# Patient Record
Sex: Female | Born: 1955
Health system: Southern US, Community
[De-identification: ages and names within clinical notes are randomized; demographics above are authoritative.]

## PROBLEM LIST (undated history)

## (undated) DIAGNOSIS — M1990 Unspecified osteoarthritis, unspecified site: Secondary | ICD-10-CM

## (undated) DIAGNOSIS — K55039 Acute (reversible) ischemia of large intestine, extent unspecified: Secondary | ICD-10-CM

## (undated) DIAGNOSIS — D239 Other benign neoplasm of skin, unspecified: Secondary | ICD-10-CM

## (undated) DIAGNOSIS — Z86010 Personal history of colonic polyps: Secondary | ICD-10-CM

## (undated) DIAGNOSIS — Z9889 Other specified postprocedural states: Secondary | ICD-10-CM

## (undated) DIAGNOSIS — E785 Hyperlipidemia, unspecified: Secondary | ICD-10-CM

## (undated) DIAGNOSIS — E669 Obesity, unspecified: Secondary | ICD-10-CM

## (undated) DIAGNOSIS — E041 Nontoxic single thyroid nodule: Secondary | ICD-10-CM

## (undated) DIAGNOSIS — D3A090 Benign carcinoid tumor of the bronchus and lung: Secondary | ICD-10-CM

## (undated) DIAGNOSIS — E039 Hypothyroidism, unspecified: Secondary | ICD-10-CM

## (undated) DIAGNOSIS — I10 Essential (primary) hypertension: Secondary | ICD-10-CM

## (undated) DIAGNOSIS — H269 Unspecified cataract: Secondary | ICD-10-CM

## (undated) DIAGNOSIS — M792 Neuralgia and neuritis, unspecified: Secondary | ICD-10-CM

## (undated) DIAGNOSIS — B351 Tinea unguium: Secondary | ICD-10-CM

## (undated) DIAGNOSIS — K5792 Diverticulitis of intestine, part unspecified, without perforation or abscess without bleeding: Secondary | ICD-10-CM

## (undated) DIAGNOSIS — L290 Pruritus ani: Secondary | ICD-10-CM

## (undated) DIAGNOSIS — L84 Corns and callosities: Secondary | ICD-10-CM

## (undated) DIAGNOSIS — Z460 Encounter for fitting and adjustment of spectacles and contact lenses: Secondary | ICD-10-CM

## (undated) DIAGNOSIS — R51 Headache: Secondary | ICD-10-CM

## (undated) DIAGNOSIS — N3941 Urge incontinence: Secondary | ICD-10-CM

## (undated) DIAGNOSIS — R112 Nausea with vomiting, unspecified: Secondary | ICD-10-CM

## (undated) DIAGNOSIS — M940 Chondrocostal junction syndrome [Tietze]: Secondary | ICD-10-CM

## (undated) DIAGNOSIS — K219 Gastro-esophageal reflux disease without esophagitis: Secondary | ICD-10-CM

## (undated) DIAGNOSIS — C801 Malignant (primary) neoplasm, unspecified: Secondary | ICD-10-CM

## (undated) DIAGNOSIS — K644 Residual hemorrhoidal skin tags: Secondary | ICD-10-CM

## (undated) DIAGNOSIS — A048 Other specified bacterial intestinal infections: Secondary | ICD-10-CM

## (undated) DIAGNOSIS — R7301 Impaired fasting glucose: Secondary | ICD-10-CM

## (undated) DIAGNOSIS — I872 Venous insufficiency (chronic) (peripheral): Secondary | ICD-10-CM

## (undated) HISTORY — DX: Diverticulitis of intestine, part unspecified, without perforation or abscess without bleeding: K57.92

## (undated) HISTORY — PX: LEFT OOPHORECTOMY: SHX1961

## (undated) HISTORY — DX: Essential (primary) hypertension: I10

## (undated) HISTORY — DX: Personal history of colonic polyps: Z86.010

## (undated) HISTORY — PX: ABDOMINAL HYSTERECTOMY: SUR658

## (undated) HISTORY — PX: THYROID SURGERY: SHX805

## (undated) HISTORY — PX: TONSILLECTOMY: SUR1361

## (undated) HISTORY — PX: COLONOSCOPY: SHX174

## (undated) HISTORY — PX: HEMORRHOID BANDING: SHX5850

## (undated) HISTORY — DX: Malignant (primary) neoplasm, unspecified: C80.1

## (undated) HISTORY — DX: Other specified bacterial intestinal infections: A04.8

## (undated) HISTORY — PX: PLANTAR FASCIA SURGERY: SHX746

## (undated) HISTORY — DX: Tinea unguium: B35.1

## (undated) HISTORY — DX: Obesity, unspecified: E66.9

## (undated) HISTORY — DX: Hyperlipidemia, unspecified: E78.5

## (undated) HISTORY — DX: Benign carcinoid tumor of the bronchus and lung: D3A.090

## (undated) HISTORY — DX: Unspecified cataract: H26.9

## (undated) HISTORY — DX: Residual hemorrhoidal skin tags: K64.4

## (undated) HISTORY — DX: Chondrocostal junction syndrome (tietze): M94.0

## (undated) HISTORY — PX: OTHER SURGICAL HISTORY: SHX169

## (undated) HISTORY — PX: APPENDECTOMY: SHX54

## (undated) HISTORY — DX: Gastro-esophageal reflux disease without esophagitis: K21.9

## (undated) HISTORY — DX: Impaired fasting glucose: R73.01

## (undated) HISTORY — PX: ESOPHAGOGASTRODUODENOSCOPY: SHX1529

## (undated) HISTORY — DX: Other benign neoplasm of skin, unspecified: D23.9

## (undated) HISTORY — DX: Headache: R51

---
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
## 1999-03-20 ENCOUNTER — Encounter (INDEPENDENT_AMBULATORY_CARE_PROVIDER_SITE_OTHER): Payer: Self-pay | Admitting: *Deleted

## 1999-03-20 ENCOUNTER — Ambulatory Visit (HOSPITAL_BASED_OUTPATIENT_CLINIC_OR_DEPARTMENT_OTHER): Admission: RE | Admit: 1999-03-20 | Discharge: 1999-03-20 | Payer: Self-pay

## 2000-08-25 ENCOUNTER — Encounter (INDEPENDENT_AMBULATORY_CARE_PROVIDER_SITE_OTHER): Payer: Self-pay | Admitting: *Deleted

## 2000-08-25 ENCOUNTER — Ambulatory Visit (HOSPITAL_BASED_OUTPATIENT_CLINIC_OR_DEPARTMENT_OTHER): Admission: RE | Admit: 2000-08-25 | Discharge: 2000-08-25 | Payer: Self-pay | Admitting: Plastic Surgery

## 2001-04-14 ENCOUNTER — Ambulatory Visit (HOSPITAL_COMMUNITY): Admission: RE | Admit: 2001-04-14 | Discharge: 2001-04-14 | Payer: Self-pay | Admitting: Obstetrics and Gynecology

## 2001-04-14 ENCOUNTER — Encounter: Payer: Self-pay | Admitting: Obstetrics and Gynecology

## 2002-04-26 ENCOUNTER — Encounter: Payer: Self-pay | Admitting: Obstetrics and Gynecology

## 2002-04-26 ENCOUNTER — Encounter: Admission: RE | Admit: 2002-04-26 | Discharge: 2002-04-26 | Payer: Self-pay | Admitting: Obstetrics and Gynecology

## 2002-05-18 ENCOUNTER — Encounter: Payer: Self-pay | Admitting: Family Medicine

## 2002-05-18 ENCOUNTER — Ambulatory Visit (HOSPITAL_COMMUNITY): Admission: RE | Admit: 2002-05-18 | Discharge: 2002-05-18 | Payer: Self-pay | Admitting: Family Medicine

## 2002-07-23 ENCOUNTER — Encounter: Payer: Self-pay | Admitting: Obstetrics and Gynecology

## 2002-07-24 ENCOUNTER — Encounter (INDEPENDENT_AMBULATORY_CARE_PROVIDER_SITE_OTHER): Payer: Self-pay

## 2002-07-24 ENCOUNTER — Inpatient Hospital Stay (HOSPITAL_COMMUNITY): Admission: RE | Admit: 2002-07-24 | Discharge: 2002-07-26 | Payer: Self-pay | Admitting: Obstetrics and Gynecology

## 2002-09-26 ENCOUNTER — Ambulatory Visit (HOSPITAL_COMMUNITY): Admission: RE | Admit: 2002-09-26 | Discharge: 2002-09-26 | Payer: Self-pay | Admitting: Neurology

## 2003-05-09 ENCOUNTER — Encounter: Admission: RE | Admit: 2003-05-09 | Discharge: 2003-05-09 | Payer: Self-pay | Admitting: Obstetrics and Gynecology

## 2003-10-02 ENCOUNTER — Ambulatory Visit (HOSPITAL_COMMUNITY): Admission: RE | Admit: 2003-10-02 | Discharge: 2003-10-02 | Payer: Self-pay | Admitting: Otolaryngology

## 2004-05-14 ENCOUNTER — Encounter: Admission: RE | Admit: 2004-05-14 | Discharge: 2004-05-14 | Payer: Self-pay | Admitting: Obstetrics and Gynecology

## 2005-01-11 DIAGNOSIS — K644 Residual hemorrhoidal skin tags: Secondary | ICD-10-CM

## 2005-01-11 HISTORY — DX: Residual hemorrhoidal skin tags: K64.4

## 2005-05-17 ENCOUNTER — Encounter: Admission: RE | Admit: 2005-05-17 | Discharge: 2005-05-17 | Payer: Self-pay | Admitting: Family Medicine

## 2005-10-01 ENCOUNTER — Ambulatory Visit: Payer: Self-pay | Admitting: Internal Medicine

## 2005-10-11 ENCOUNTER — Ambulatory Visit (HOSPITAL_COMMUNITY): Admission: RE | Admit: 2005-10-11 | Discharge: 2005-10-11 | Payer: Self-pay | Admitting: Internal Medicine

## 2005-10-11 ENCOUNTER — Encounter (INDEPENDENT_AMBULATORY_CARE_PROVIDER_SITE_OTHER): Payer: Self-pay | Admitting: Specialist

## 2005-10-11 ENCOUNTER — Ambulatory Visit: Payer: Self-pay | Admitting: Internal Medicine

## 2005-10-11 ENCOUNTER — Encounter: Payer: Self-pay | Admitting: Internal Medicine

## 2005-10-29 ENCOUNTER — Ambulatory Visit: Payer: Self-pay | Admitting: Internal Medicine

## 2006-03-14 ENCOUNTER — Ambulatory Visit (HOSPITAL_COMMUNITY): Admission: RE | Admit: 2006-03-14 | Discharge: 2006-03-14 | Payer: Self-pay | Admitting: Family Medicine

## 2006-05-10 ENCOUNTER — Ambulatory Visit: Payer: Self-pay | Admitting: Thoracic Surgery

## 2006-05-19 ENCOUNTER — Encounter: Admission: RE | Admit: 2006-05-19 | Discharge: 2006-05-19 | Payer: Self-pay | Admitting: Obstetrics and Gynecology

## 2007-05-22 ENCOUNTER — Encounter: Admission: RE | Admit: 2007-05-22 | Discharge: 2007-05-22 | Payer: Self-pay | Admitting: Family Medicine

## 2007-05-25 ENCOUNTER — Encounter: Admission: RE | Admit: 2007-05-25 | Discharge: 2007-05-25 | Payer: Self-pay | Admitting: Family Medicine

## 2007-12-03 ENCOUNTER — Encounter: Admission: RE | Admit: 2007-12-03 | Discharge: 2007-12-03 | Payer: Self-pay | Admitting: Orthopedic Surgery

## 2008-01-23 ENCOUNTER — Ambulatory Visit: Payer: Self-pay | Admitting: Family Medicine

## 2008-05-22 ENCOUNTER — Encounter: Admission: RE | Admit: 2008-05-22 | Discharge: 2008-05-22 | Payer: Self-pay | Admitting: Family Medicine

## 2008-07-01 ENCOUNTER — Encounter: Payer: Self-pay | Admitting: Internal Medicine

## 2008-07-29 ENCOUNTER — Encounter: Payer: Self-pay | Admitting: Internal Medicine

## 2008-10-09 ENCOUNTER — Encounter: Payer: Self-pay | Admitting: Internal Medicine

## 2008-10-17 ENCOUNTER — Encounter: Payer: Self-pay | Admitting: Internal Medicine

## 2008-10-23 ENCOUNTER — Ambulatory Visit: Payer: Self-pay | Admitting: Internal Medicine

## 2008-10-23 DIAGNOSIS — K644 Residual hemorrhoidal skin tags: Secondary | ICD-10-CM | POA: Insufficient documentation

## 2008-10-23 DIAGNOSIS — R1013 Epigastric pain: Secondary | ICD-10-CM | POA: Insufficient documentation

## 2008-11-18 ENCOUNTER — Ambulatory Visit: Payer: Self-pay | Admitting: Internal Medicine

## 2008-11-18 ENCOUNTER — Encounter: Payer: Self-pay | Admitting: Internal Medicine

## 2008-11-20 ENCOUNTER — Encounter: Payer: Self-pay | Admitting: Internal Medicine

## 2008-11-20 ENCOUNTER — Telehealth: Payer: Self-pay | Admitting: Internal Medicine

## 2009-06-24 ENCOUNTER — Encounter: Admission: RE | Admit: 2009-06-24 | Discharge: 2009-06-24 | Payer: Self-pay | Admitting: Family Medicine

## 2010-01-11 DIAGNOSIS — K5792 Diverticulitis of intestine, part unspecified, without perforation or abscess without bleeding: Secondary | ICD-10-CM

## 2010-01-11 HISTORY — DX: Diverticulitis of intestine, part unspecified, without perforation or abscess without bleeding: K57.92

## 2010-01-31 ENCOUNTER — Encounter: Payer: Self-pay | Admitting: Family Medicine

## 2010-02-01 ENCOUNTER — Encounter: Payer: Self-pay | Admitting: Family Medicine

## 2010-02-01 ENCOUNTER — Encounter: Payer: Self-pay | Admitting: Otolaryngology

## 2010-05-22 ENCOUNTER — Other Ambulatory Visit: Payer: Self-pay | Admitting: Family Medicine

## 2010-05-22 DIAGNOSIS — Z1231 Encounter for screening mammogram for malignant neoplasm of breast: Secondary | ICD-10-CM

## 2010-05-29 NOTE — Op Note (Signed)
   NAME:  Debbie Miranda, Debbie Miranda                          ACCOUNT NO.:  [id number]   MEDICAL RECORD NO.:  [id number]                   PATIENT TYPE:  OUT   LOCATION:  MDC                                  FACILITY:  [facility]   PHYSICIAN:  Marlan Palau, M.D.               DATE OF BIRTH:  1955-07-01   DATE OF PROCEDURE:  09/26/2002  DATE OF DISCHARGE:                                 OPERATIVE REPORT   PROCEDURE:  Lumbar puncture.   PHYSICIAN:  Marlan Palau, M.D.   INDICATIONS FOR PROCEDURE:  This patient is a 55 year old with a history of  episodic pain and numbness of the extremities and body.  The patient is  being evaluated for demyelinating disease or other inflammatory causes of  changes.   DESCRIPTION OF PROCEDURE:  The lumbar puncture was performed with the  patient in the fetal position on the left side.  The low back was cleaned  with Betadine solution, and approximately 2 mL of 1% Xylocaine was used as  local anesthetic.  The #20 gauge spinal needle was inserted into the L3-4  level, and approximately 18 mL of clear colorless spinal fluid was removed  for testing.  Tube #1 was sent for VDRL, cryptococcal antigen and antigen-diverting enzyme  level.  Tube #2 was sent for oligoclonal banding, IgG to albumin ratio.  Tube #3 was sent for cells, differential, glucose and protein.  Tube #4 was  sent for Lyme's antibody panel.  The blood work was all sent for compliment  level, C3, C4, CH50, protein-S, protein-C anti-inflammatory levels and a  antiphospholipid antibody panel, cryoglobulin antibody and factor V Leiden  studies.  The patient tolerated the procedure well.  No complications with the above  procedure were noted.                                                Marlan Palau, M.D.    CKW/MEDQ  D:  09/26/2002  T:  09/26/2002  Job:  [id number]

## 2010-05-29 NOTE — H&P (Signed)
NAME:  Debbie Miranda, Debbie Miranda                          ACCOUNT NO.:  [id number]   MEDICAL RECORD NO.:  [id number]                   PATIENT TYPE:  INP   LOCATION:  NA                                   FACILITY:  [facility]   PHYSICIAN:  Katherine Roan, M.D.               DATE OF BIRTH:  23-Jul-1955   DATE OF ADMISSION:  DATE OF DISCHARGE:                                HISTORY & PHYSICAL   CHIEF COMPLAINT:  Abdominal pain.   HISTORY OF PRESENT ILLNESS:  Debbie Miranda is a 55 year old gravida 1 female  who delivered in 1984, who presented to the office last week with acute  abdominal pain.  Found to have a large 7 cm posterior mass confirmed on  ultrasound and CT.  Is admitted now for laparotomy, possible hysterectomy.   PAST MEDICAL HISTORY:  She has a history of a tonsillectomy and a rectal  fistula.   ALLERGIES:  She is said to be allergic to CODEINE.   MEDICATIONS:  On no medications at this point.  She has taken Vicodin for  pain.   REVIEW OF SYSTEMS:  HEENT:  She wears contacts, but has noted no decrease in  vision or auditory acuity.  No history of dizziness.  HEART:  No history of  hypertension, no rheumatic fever, no mitral valve prolapse.  LUNGS:  No  chronic cough, no asthma, no hay fever.  GENITOURINARY:  No stress  incontinence or frequency of urinary.  GASTROINTESTINAL:  No bowel habit  change, no melena, no diarrhea.  MUSCLES, BONES, AND JOINTS:  Negative.   FAMILY HISTORY:  Mother is 68 and living with hypertension.  Father died at  age 31 of renal cell carcinoma.  She has one sister who had a hysterectomy  because of a fibroid.  One brother living and well.  Her mother and brother  were hypertensive.  She has grandparents with CVA's.  Her maternal  grandmother was diabetic.   PHYSICAL EXAMINATION:  GENERAL:  A well-developed nourished female who is in  some distress with dull abdominal pain.  VITAL SIGNS:  Blood pressure is 140/80, weight 184.  She is well-developed,  nourished, appears to be her stated age.  She is oriented to time, place,  and recent event.  HEENT:  Unremarkable.  Oropharynx is not injected.  NECK:  Supple.  Carotid pulses are equal without bruits.  BREASTS:  No masses or tenderness.  LUNGS:  Clear to P and A.  HEART:  Normal sinus rhythm, no murmur.  ABDOMEN:  Tender without guarding, no rebound.  EXTREMITIES:  Good range of motion, equal pulses and reflexes.  PELVIC:  The cervix is clean.  The uterus is anterior.  There is a large 6  cm to 8 cm left adnexal mass which is posterior to the uterus.  It is quite  tender.  NEUROLOGIC:  Grossly intact.    IMPRESSION:  Large  pelvic mass.   PLAN:  Laparotomy and necessary procedures.                                               Katherine Roan, M.D.    SDM/MEDQ  D:  07/23/2002  T:  07/23/2002  Job:  [id number]

## 2010-05-29 NOTE — Discharge Summary (Signed)
   NAME:  Debbie Miranda, Debbie Miranda                          ACCOUNT NO.:  [id number]   MEDICAL RECORD NO.:  [id number]                   PATIENT TYPE:  INP   LOCATION:  0444                                 FACILITY:  [facility]   PHYSICIAN:  Katherine Roan, M.D.               DATE OF BIRTH:  1956/01/07   DATE OF ADMISSION:  07/24/2002  DATE OF DISCHARGE:  07/26/2002                                 DISCHARGE SUMMARY   ADMISSION DIAGNOSIS:  Pelvic pain and pelvic cyst.   DISCHARGE DIAGNOSES:  1. Uterine fibroids.  2. Inflammatory cyst of pelvis.  3. Endometriosis of left ovary.   INDICATION FOR ADMISSION:  Ms. Dancer is a 55 year old female that had  delivered in 1984, presented in the office with abdominal pain, found to  have a 7 cm pelvic mass.  She was admitted for laparoscopy and possible  hysterectomy.  She was on no medications other than pain pills.   LABORATORY STUDIES:  Hemoglobin on admission was 13.  PT and PTT were  normal.  Routine chemistry was likewise normal.  She was A positive.   HOSPITAL COURSE:  The patient was admitted to the hospital, underwent a  pelvic exam under anesthesia, exploratory laparotomy, excision of pelvic  cyst, total hysterectomy with removal of left ovary and left ovarian cyst.  Culdoplasty was performed and an incidental appendectomy was performed.   The patient was admitted to the hospital and underwent the above-mentioned  surgical procedure with excision of a large pelvic cyst.  The left ovary had  numerous dark, endometriotic implants in the hilum, although pathologically  this was not confirmed.  The cyst in the pelvis was an inflammatory cyst,  was actually between the uterus and the bladder.  The appendix was normal.  The patient's postoperative course was uncomplicated.  She remained afebrile  and without complaint, and she was discharged on July 26, 2002 to home and  office care.  She was given a prescription for Percocet for pain.  She was  asked to call for fever, bleeding, or any other difficulty.   CONDITION ON DISCHARGE:  Improved.                                               Katherine Roan, M.D.    SDM/MEDQ  D:  08/09/2002  T:  08/09/2002  Job:  [id number]

## 2010-05-29 NOTE — H&P (Signed)
NAME:  Debbie Miranda, Debbie Miranda                ACCOUNT NO.:  [id number]   MEDICAL RECORD NO.:  [id number]         PATIENT TYPE:  AMB   LOCATION:  DAY                           FACILITY:  [facility]   PHYSICIAN:  Lionel December, M.D.    DATE OF BIRTH:  [birth year]   DATE OF ADMISSION:  DATE OF DISCHARGE:  LH                                HISTORY & PHYSICAL   CHIEF COMPLAINT:  Change in bowel movements with more of a constipation.   HISTORY OF PRESENT ILLNESS:  Debbie Miranda is a 55 year old lady who presents for  further evaluation of change in bowel habits.  She used to be very regular  at least once daily.  For the past 2 months, she has been having difficulty  moving her bowels.  She is going up to a week at a time without a bowel  movement.  She has been trying Colace and enemas with little results.  She  took Perdiem twice a day for 3 days and finally had some results on one  occasion.  She saw Sherie Don, Family Nurse Practitioner with Dr.  Gerda Diss and was started on MiraLax.  She has been on it for a few days and  finally had a bowel movement this morning.  She has had intermittent bright  red blood per rectum, especially if she strains.  She feels it is from her  hemorrhoids.  She notes her stools have a decreased caliber.  Denies any  melena.  She has changed her diet over the last couple months trying to lose  weight.  She has lost 30 pounds.  She is consuming high fiber diet.  She is  drinking 80 ounces of water daily and walking 30 minutes daily.  The only  change in her medication was the addition of flaxseed oil.  Denies any  problems with heartburn, nausea or vomiting.  Her last colonoscopy was in  January 2000 done for chronic intermittent rectal bleeding.  She had  internal and external hemorrhoids but otherwise normal.   CURRENT MEDICATIONS:  1. Simvastatin 20 mg daily.  2. Multivitamin daily.  3. Aspirin 81 mg daily.  4. Calcium 500 mg two daily.  5. Garlic 1000 mg daily.  6. Omega-3  1200 mg two times daily.  7. Flaxseed oil 1000 mg daily.  8. MiraLax 17 grams daily.   ALLERGIES:  NO KNOWN DRUG ALLERGIES.   PAST MEDICAL HISTORY:  Hypercholesterolemia; status post hysterectomy,  tonsillectomy and appendectomy.   FAMILY HISTORY:  Mother has hypercholesterolemia and hypertension.  Father  deceased at age 64 of kidney cancer.  No family history of colorectal  cancer.   SOCIAL HISTORY:  She is married with one child.  She is a housewife.  She  has never been a smoker.  No alcohol use.   REVIEW OF SYSTEMS:  GI AND CONSTITUTIONAL:  See HPI.  CARDIOPULMONARY:  No  chest pain or shortness of breath.   PHYSICAL EXAMINATION:  VITAL SIGNS:  Weight 251, height 5 feet 8 inches.  Temperature 97.8, blood pressure 130/70, pulse 72.  GENERAL:  Pleasant obese Caucasian  female in no acute distress.  SKIN:  Warm and dry.  No jaundice.  HEENT:  Conjunctivae are pink.  Sclerae nonicteric.  Oropharyngeal mucosa  moist and pink.  No lesions, erythema or exudate.  No lymphadenopathy or  thyromegaly.  CHEST:  Lungs are clear to auscultation.  Cardiac exam reveals regular rate  and rhythm, normal S1/S2, no murmurs, rubs, or gallops.  ABDOMEN:  Positive bowel sounds, obese but symmetrical, soft.  Nontender.  No organomegaly or masses.  No rebound tenderness or guarding.  No abdominal  bruits or hernias.  EXTREMITIES:  No edema.   LABORATORIES:  Labs from September 27, 2005 CBC normal, MET-7 normal, LFTs  normal.  Amylase normal.   IMPRESSION AND PLAN:  Adraine is a 55 year old lady with a change in her bowel  movements.  She has had a significant change to constipation with decreased  stool caliber.  She is having intermittent bright red blood per rectum with  known history of hemorrhoids.  She has lost 30 pounds which has been  intentional.  Recent labs were normal although TSH was not checked although  I doubt her constipation is related to hypothyroidism given that she has  been  able to lose weight with dieting.  Her last colonoscopy was over 7  years ago.  Given this acute change in her bowel habits, I would recommend  diagnostic colonoscopy at this time.  I discussed risks, alternatives and  benefits with regards to the risk of reaction to medication, infection,  bleeding and perforation.  The patient is agreeable to proceed.  Also, she  will continue MiraLax 17 grams daily.      Tana Coast, P.A.      Lionel December, M.D.  Electronically Signed    LL/MEDQ  D:  10/01/2005  T:  10/04/2005  Job:  [id number]   cc:   Lorin Picket A. Gerda Diss, MD  Fax: [phone]   Sherie Don, FNP

## 2010-05-29 NOTE — Op Note (Signed)
NAME:  Debbie Miranda, Debbie Miranda                ACCOUNT NO.:  [id number]   MEDICAL RECORD NO.:  [id number]          PATIENT TYPE:  AMB   LOCATION:  DAY                           FACILITY:  [facility]   PHYSICIAN:  Lionel December, M.D.    DATE OF BIRTH:  [birth year]   DATE OF PROCEDURE:  10/11/2005  DATE OF DISCHARGE:                                 OPERATIVE REPORT   PROCEDURE:  Colonoscopy.   INDICATION:  [name] is a 55 year old Caucasian female with sudden change in  her bowel habits about 2 months ago.  She has gone from having regular bowel  movements like clockwork to being constipated with poor results with diet  and stool softeners.  Her last colonoscopy was in January 2000.  Family  history is negative colorectal carcinoma.  Procedure risks were reviewed the  patient, informed consent was obtained.  Please note that she recently had a  TSH which was normal.   MEDS FOR CONSCIOUS SEDATION:  Fentanyl 50 mcg IV Versed 6 mg IV.   FINDINGS:  Procedure performed in endoscopy suite.  The patient's vital  signs and O2 sat were monitored during procedure and remained stable.  The  patient was placed left lateral position and rectal examination performed.  No abnormality noted external or digital exam.  Olympus videoscope was  placed rectum and advanced under vision into sigmoid colon beyond.  Preparation was excellent.  Scope was easily passed into cecum which was  identified by appendiceal orifice and ileocecal valve.  Pictures taken for  the record.  Short segment of T I was also examined was normal.  As the  scope was withdrawn colonic mucosa was carefully examined.  There was small  flat polyp at proximal sigmoid colon, possibly hyperplastic lesion.  This  was ablated via cold biopsy for histology.  Polypectomy was complete.  Mucosa rest of the sigmoid colon and rectum was normal.  Scope was  retroflexed to examine anorectal junction and small hemorrhoids noted below  the dentate line along with two  anal papilla.  These are rather small.  Pictures taken for the record.  Endoscope was straightened and withdrawn.  The patient tolerated the procedure well.   FINAL DIAGNOSIS:  No evidence of colonic stricture or neoplasm.  Small  sessile polyp ablated via cold biopsy from proximal sigmoid colon.   External hemorrhoids and two small anal papillae.   Suspect her constipation is either due dietary changes or idiopathic  (colonic dysmotility).   RECOMMENDATIONS:  She will continue high-fiber diet and fiber supplement 3-4  grams b.i.d. and MiraLax as before.   I will be contacting the patient with biopsy results and further  recommendations.    We will see her in office in 2-3 weeks.      Lionel December, M.D.  Electronically Signed     NR/MEDQ  D:  10/11/2005  T:  10/12/2005  Job:  [id number]   cc:   Lorin Picket A. Gerda Diss, MD  Fax: [phone]

## 2010-05-29 NOTE — Op Note (Signed)
NAME:  Debbie Miranda, Debbie Miranda                          ACCOUNT NO.:  [id number]   MEDICAL RECORD NO.:  [id number]                   PATIENT TYPE:  INP   LOCATION:  Z660                                 FACILITY:  [facility]   PHYSICIAN:  Katherine Roan, M.D.               DATE OF BIRTH:  1955-02-11   DATE OF PROCEDURE:  07/24/2002  DATE OF DISCHARGE:                                 OPERATIVE REPORT   SURGERY PERFORMED:  1. Pelvic exam under anesthesia.  2. Exploratory laparotomy.  3. Excision of pelvic cysts.  4. Excision of posterior pelvic nodule.  5. Total abdominal hysterectomy.  6. Left salpingo-oophorectomy.  7. Removal of right ovarian cyst and culdoplasty.   SURGEON:  Katherine Roan, M.D.   DESCRIPTION OF PROCEDURE:  The patient was placed in lithotomy position,  frogleg.  Before prep, a pelvic exam under anesthesia confirmed the pelvic  mass that was seen on ultrasound.  Foley catheter was inserted, and the  patient was prepped in the usual fashion.  Transverse skin incision was made  in the abdomen.  Pelvic washings were obtained after we entered the pelvic  peritoneum.  There was a cystic structure between the uterus and the  anterior bladder that measured about 6 cm.  It was anterior to the uterus,  and we thought it was posterior.  It contained blood, and I removed it using  sharp dissection.  It appeared grossly benign.  The left ovary had some  endometriosis on its surface.  The right ovary had a fleshy cystic mass in  the ovary.  The upper abdomen was explored.  The liver, gallbladder, two  kidneys, and the periaortic areas were all free from any suspicious  findings.  The abdominal viscera were then packed away from the pelvic  viscera.  The pelvic cyst was excised.  Small nodule posteriorly on the  right uterosacral ligament was excised.  This was consistent with small  seedling fibroid.  The right ovarian cyst was excised using 3-0 Vicryl for  hemostasis.  The left  ovary, because of the endometriosis in the surface,  was removed.  Left infundibulopelvic ligament was skeletonized and ligated.  The right uteroovarian anastomosis and tube were clamped and ligated as  well.  Round ligaments were suture ligated.  Bladder flap created.  Uterine  vessels were isolated and clamped and ligated.  Bladder was pushed off the  lower segment carefully.  Cardinals and uterosacral ligaments were then  clamped and ligated with 0 chromic suture.  The angles of the vagina were  entered; specimen was removed from the operative field.  This consisted of  the uterus and left tube and ovary.  The vagina was then closed in the  vertical plane using horizontal mattress sutures of 0 chromic and 0 Vicryl.  Culdoplasty was then performed using 0 Vicryl sutures.  Vault support was  obtained.  Retroperitoneal area  was closed.  Hemostasis was secure.  The  sponge and needle count was correct.  The parietal peritoneum was closed  with 2-0 PDS.  The fascia was closed with 2-0 PDS.  Subcutaneum was closed  with interrupted sutures of 2-0 PDS, and the skin was closed with 3-0 plain  subcuticular type suture.  Hemostasis was secure.  Prior to opening the  abdomen, the lower abdomen was infiltrated with 0.5% Marcaine with  epinephrine.  Ms. Lacek tolerated the procedure well.                                               Katherine Roan, M.D.    SDM/MEDQ  D:  07/24/2002  T:  07/24/2002  Job:  [id number]

## 2010-05-29 NOTE — Op Note (Signed)
[facility]  Patient:    Debbie Miranda, Debbie Miranda                         MRN: [id number] Proc. Date: 03/20/99 Attending:  Zigmund Daniel, M.D.                           Operative Report  PREOPERATIVE DIAGNOSIS:  Anterior anal fissure with associated skin tag.  POSTOPERATIVE DIAGNOSIS:  Anterior anal fissure with associated skin tag.  OPERATION:  Excision of skin tag and repair of anal fissure.  SURGEON:  Zigmund Daniel, M.D.  ANESTHESIA:  General.  DESCRIPTION OF PROCEDURE:  After the patient was adequately anesthetized in the  lithotomy position, I carefully prepped the area and examined the anal canal. There was a very large anterior fissure.  There was some slightly enlarged hemorrhoids, but not a significant clinical problem.  I thoroughly anesthetized the anal region with long-acting local anesthetic.  There was a large skin tag just  distal to the fissure which I excised with cautery.  I cauterized the fissure itself with the Bovie to reduce subsequent bleeding.  The distal rectal mucosa as normal in appearance.  I put in a retractor to slightly stretch the anal canal, and made an incision on the right lateral aspect of the anal canal over the palpable hypertrophic internal sphincter, and I cut the hypertrophic portion with the scissors.  Hemostasis was good.  I closed the incision with a single chromic stitch.  I applied a small bandage.  The patient tolerated the operation well. DD:  03/20/99 TD:  03/22/99 Job: [id number]

## 2010-06-30 ENCOUNTER — Ambulatory Visit
Admission: RE | Admit: 2010-06-30 | Discharge: 2010-06-30 | Disposition: A | Discharge status: Home / Self Care | Payer: 59 | Source: Ambulatory Visit | Attending: Family Medicine | Admitting: Family Medicine

## 2010-06-30 DIAGNOSIS — Z1231 Encounter for screening mammogram for malignant neoplasm of breast: Secondary | ICD-10-CM

## 2010-08-10 ENCOUNTER — Ambulatory Visit (HOSPITAL_COMMUNITY)
Admission: RE | Admit: 2010-08-10 | Discharge: 2010-08-10 | Disposition: A | Discharge status: Home / Self Care | Payer: 59 | Source: Ambulatory Visit | Attending: Family Medicine | Admitting: Family Medicine

## 2010-08-10 ENCOUNTER — Other Ambulatory Visit: Payer: Self-pay | Admitting: Family Medicine

## 2010-08-10 DIAGNOSIS — R112 Nausea with vomiting, unspecified: Secondary | ICD-10-CM | POA: Insufficient documentation

## 2010-08-10 DIAGNOSIS — Q619 Cystic kidney disease, unspecified: Secondary | ICD-10-CM | POA: Insufficient documentation

## 2010-08-10 DIAGNOSIS — R1032 Left lower quadrant pain: Secondary | ICD-10-CM | POA: Insufficient documentation

## 2010-08-10 DIAGNOSIS — K5732 Diverticulitis of large intestine without perforation or abscess without bleeding: Secondary | ICD-10-CM | POA: Insufficient documentation

## 2010-08-10 MED ORDER — IOHEXOL 300 MG/ML  SOLN
100.0000 mL | Freq: Once | INTRAMUSCULAR | Status: AC | PRN
  Administered 2010-08-10: 100 mL via INTRAVENOUS

## 2010-11-03 ENCOUNTER — Other Ambulatory Visit: Payer: Self-pay | Admitting: Family Medicine

## 2010-11-03 ENCOUNTER — Ambulatory Visit (HOSPITAL_COMMUNITY)
Admission: RE | Admit: 2010-11-03 | Discharge: 2010-11-03 | Disposition: A | Discharge status: Home / Self Care | Payer: 59 | Source: Ambulatory Visit | Attending: Family Medicine | Admitting: Family Medicine

## 2010-11-03 DIAGNOSIS — R05 Cough: Secondary | ICD-10-CM

## 2010-11-03 DIAGNOSIS — R059 Cough, unspecified: Secondary | ICD-10-CM

## 2010-11-03 DIAGNOSIS — E041 Nontoxic single thyroid nodule: Secondary | ICD-10-CM

## 2010-11-04 ENCOUNTER — Ambulatory Visit (HOSPITAL_COMMUNITY)
Admission: RE | Admit: 2010-11-04 | Discharge: 2010-11-04 | Disposition: A | Discharge status: Home / Self Care | Payer: 59 | Source: Ambulatory Visit | Attending: Family Medicine | Admitting: Family Medicine

## 2010-11-04 DIAGNOSIS — E049 Nontoxic goiter, unspecified: Secondary | ICD-10-CM | POA: Insufficient documentation

## 2010-11-04 DIAGNOSIS — E041 Nontoxic single thyroid nodule: Secondary | ICD-10-CM

## 2010-12-10 DIAGNOSIS — B351 Tinea unguium: Secondary | ICD-10-CM

## 2010-12-10 HISTORY — DX: Tinea unguium: B35.1

## 2010-12-31 ENCOUNTER — Ambulatory Visit (HOSPITAL_COMMUNITY)
Admission: RE | Admit: 2010-12-31 | Discharge: 2010-12-31 | Disposition: A | Discharge status: Home / Self Care | Payer: 59 | Source: Ambulatory Visit | Attending: Family Medicine | Admitting: Family Medicine

## 2010-12-31 ENCOUNTER — Other Ambulatory Visit: Payer: Self-pay | Admitting: Family Medicine

## 2010-12-31 DIAGNOSIS — R059 Cough, unspecified: Secondary | ICD-10-CM

## 2010-12-31 DIAGNOSIS — R05 Cough: Secondary | ICD-10-CM | POA: Insufficient documentation

## 2011-01-09 ENCOUNTER — Other Ambulatory Visit: Payer: Self-pay | Admitting: Internal Medicine

## 2011-01-11 NOTE — Telephone Encounter (Signed)
Refilled for # 30 only, Patient needs an appointment no more refills until seen.

## 2011-01-12 DIAGNOSIS — D3A090 Benign carcinoid tumor of the bronchus and lung: Secondary | ICD-10-CM

## 2011-01-12 HISTORY — DX: Benign carcinoid tumor of the bronchus and lung: D3A.090

## 2011-03-15 ENCOUNTER — Other Ambulatory Visit: Payer: Self-pay | Admitting: Dermatology

## 2011-04-25 ENCOUNTER — Other Ambulatory Visit: Payer: Self-pay | Admitting: Internal Medicine

## 2011-04-30 ENCOUNTER — Encounter: Payer: Self-pay | Admitting: *Deleted

## 2011-05-03 ENCOUNTER — Ambulatory Visit (INDEPENDENT_AMBULATORY_CARE_PROVIDER_SITE_OTHER): Payer: 59 | Admitting: Internal Medicine

## 2011-05-03 ENCOUNTER — Encounter: Payer: Self-pay | Admitting: Internal Medicine

## 2011-05-03 VITALS — BP 126/86 | HR 64 | Ht 68.0 in | Wt 258.6 lb

## 2011-05-03 DIAGNOSIS — K219 Gastro-esophageal reflux disease without esophagitis: Secondary | ICD-10-CM

## 2011-05-03 DIAGNOSIS — K629 Disease of anus and rectum, unspecified: Secondary | ICD-10-CM

## 2011-05-03 DIAGNOSIS — K5792 Diverticulitis of intestine, part unspecified, without perforation or abscess without bleeding: Secondary | ICD-10-CM

## 2011-05-03 DIAGNOSIS — K6289 Other specified diseases of anus and rectum: Secondary | ICD-10-CM

## 2011-05-03 DIAGNOSIS — L290 Pruritus ani: Secondary | ICD-10-CM

## 2011-05-03 DIAGNOSIS — K5732 Diverticulitis of large intestine without perforation or abscess without bleeding: Secondary | ICD-10-CM

## 2011-05-03 MED ORDER — METRONIDAZOLE 500 MG PO TABS: 500.0000 mg | ORAL_TABLET | Freq: Three times a day (TID) | ORAL | Status: AC

## 2011-05-03 MED ORDER — CIPROFLOXACIN HCL 500 MG PO TABS: 500.0000 mg | ORAL_TABLET | Freq: Two times a day (BID) | ORAL | Status: AC

## 2011-05-03 MED ORDER — PANTOPRAZOLE SODIUM 40 MG PO TBEC: 40.0000 mg | DELAYED_RELEASE_TABLET | Freq: Every day | ORAL | Status: DC

## 2011-05-03 NOTE — Patient Instructions (Signed)
You have been scheduled for an appointment with _____ at [facility]. Your appointment is on ____ at ___. Please arrive at ____ for registration. Make certain to bring a list of current medications, including any over the counter medications or vitamins. Also bring your co-pay if you have one as well as your insurance cards. [facility] is located at [address]. Should you need to reschedule your appointment, please contact them at [phone].  We have sent medications to your pharmacy for you to pick up at your convenience.

## 2011-05-03 NOTE — Progress Notes (Signed)
Subjective:    Patient ID: Debbie Miranda, female    DOB: April 09, 1955, 56 y.o.   MRN: [id number]  HPI This is a pleasant 56 year old white woman seen in the past for erosive reflux esophagitis. She was started on pantoprazole 40 mg daily and did well. She took that for at least several months and then decided that she did not want to take a medicine chronically. For about 56 year or so she did well. Perhaps longer. In the past several weeks she's had problems with epigastric pain, nausea and burning retrosternal pain it times. It has awakened her at night. She does not use caffeine and she is not a smoker. She went back on pantoprazole for the past 56 weeks it is notable improvement but not a lot yet. There is no dysphagia.  Another problem she is here to discuss is that she was diagnosed with diverticulitis by CT scan, left sided colon diverticulitis in July of 2012. She responded to antibiotics. She had another episode and responded to antibiotics again. In the past week she's developed recurrent pain and similar clinical scenario, she had a prescription at home, of Cipro and metronidazole and started that and as noted prompt improvement. She has several more days of antibiotics left. Previously had a colonoscopy in 2007 by Dr. Karilyn Cota, showing diverticulosis.  Another problem she has his anal itching has been severe intermittent for 56 years. She says she feels a bump in the perianal area and there is a focal point of itching. She has tried steroid cream as, over-the-counter agents and is found the only thing that seems to help is to soak in a hot, and she'll get relief for 30 minutes or so. She is trying to avoid over aggressive wiping, she notes no significant change in her bowel habits. She is using wet wipes when she can. There is no discharge from the anal area or this pump. No bleeding reported.  Medications, allergies, past medical history, past surgical history, family history and social history  are reviewed and updated in the EMR.    Review of Systems As above    Objective:   Physical Exam WDWN NAD - obese abd mildly tender in epigastrium Rectal : exam with female staff present - anoderm with tags at 5 oclock and 9 oclock and there is a pinpoint red lesion at  5oclock - skin is open there, no fluid or pus or blood expessed - this is where she itches, no rectal mass, brown stool (scanty)       Assessment & Plan:   1. GERD (gastroesophageal reflux disease)   I think her upper abdominal symptoms are related to this, she is to stay on PPI. If she fails to improve over the next couple of months she is to call me back, otherwise am asked to return later in the year so we can discuss dose modification, she may not need chronic daily therapy but possibly well.   2. Diverticulitis   Finish Cipro and metronidazole. I have given her another prescription to keep at home as she has no tach, she is to contact me if she needs to use those antibiotics.   3. Anal itching   4. Perianal lesion   She has this perianal lesion of unclear significance. It  does not look worrisome but she indicates that this is the problem lesion in that she has focal itching at this area. I do not think it's from over aggressive hygiene she has no other  perianal irritation. She has failed multiple topical steroids and over-the-counter agents including pramoxine I believe. While it looks like the opening of a fistula, I don't think that's the case. I do not detect any type of abscess. I am referring her to general surgery for further evaluation and possible treatment. Perhaps there is some sort of focal topical therapy that the surgeon may be able applied relieve her problems. I made no promises, however.     Cc: Lilyan Punt, MD and Claud Kelp, MD

## 2011-05-25 ENCOUNTER — Other Ambulatory Visit: Payer: Self-pay | Admitting: Family Medicine

## 2011-05-25 DIAGNOSIS — Z1231 Encounter for screening mammogram for malignant neoplasm of breast: Secondary | ICD-10-CM

## 2011-05-26 ENCOUNTER — Encounter (INDEPENDENT_AMBULATORY_CARE_PROVIDER_SITE_OTHER): Payer: Self-pay | Admitting: General Surgery

## 2011-05-26 ENCOUNTER — Ambulatory Visit (INDEPENDENT_AMBULATORY_CARE_PROVIDER_SITE_OTHER): Payer: 59 | Admitting: General Surgery

## 2011-05-26 VITALS — BP 138/86 | HR 60 | Temp 96.4°F | Resp 18 | Ht 68.0 in | Wt 261.4 lb

## 2011-05-26 DIAGNOSIS — L290 Pruritus ani: Secondary | ICD-10-CM

## 2011-05-26 NOTE — Patient Instructions (Signed)
The cause of your itching is a skin problem called pruritus ani.   This is reversible. You also have some tiny hemorrhoids but I doubt that they are causing the itching.  We have discussed the treatment plan for pruritus ani and you have been given written instructions.  Return to see me if your symptoms do not improve after one month.

## 2011-05-26 NOTE — Progress Notes (Signed)
Patient ID: Debbie Miranda, female   DOB: Feb 11, 1955, 56 y.o.   MRN: [id number]  Chief Complaint  Patient presents with  . Rectal Problems    anal mass    HPI Debbie Miranda is a 56 y.o. female. She is referred by Dr. Stan Head  because of rectal itching. Her primary care physician is Dr. Lilyan Punt.  The patient gives a two-year history of moderately intense anal itching. She rubs or scratches it a lot in the shower and at night. She also notices some small bumps on the skin. She notices excessive moisture of the perianal skin. Her bowel movements are normal. She has rare minimal pain with bowel movements. She also rarely has any bleeding. She has tried multiple steroid creams. She feels the symptoms are coming from the left side.  Past history is significant for a repair anal fissure by Dr. Lebron Conners in 2000. She recovered uneventfully from that. She has a history of esophagitis and a history of outpatient treatment for acute diverticulitis. She has not had any abdominal symptoms right now.  HPI  Past Medical History  Diagnosis Date  . Esophageal reflux   . Hyperlipidemia   . Hypertension   . Headache   . Obesity   . Costochondritis   . External hemorrhoids without mention of complication   . Diverticulitis     Past Surgical History  Procedure Date  . Esophagogastroduodenoscopy   . Appendectomy   . Abdominal hysterectomy   . Tonsillectomy   . Left oophorectomy   . Thyroid surgery     nodule and partial thyroidectomy    Family History  Problem Relation Age of Onset  . Kidney cancer Father   . Breast cancer Maternal Grandmother   . Colon cancer Neg Hx   . Heart disease Maternal Grandfather     Social History History  Substance Use Topics  . Smoking status: Never Smoker   . Smokeless tobacco: Never Used  . Alcohol Use: Yes     wine    Allergies  Allergen Reactions  . Codeine     Current Outpatient Prescriptions  Medication Sig Dispense Refill  .  aspirin 81 MG tablet Take 81 mg by mouth daily.      . cholecalciferol (VITAMIN D) 1000 UNITS tablet Take 1,000 Units by mouth daily.      . Flaxseed, Linseed, 1000 MG CAPS Take 1 capsule by mouth daily.      . Garlic 1000 MG CAPS Take 1 capsule by mouth daily.      Marland Kitchen levothyroxine (SYNTHROID, LEVOTHROID) 88 MCG tablet Take 88 mcg by mouth daily.      . Multiple Vitamins-Minerals (CENTRUM SILVER) tablet Take 1 tablet by mouth daily.      . Omega-3 Fatty Acids (FISH OIL) 1200 MG CAPS Take 1 capsule by mouth 2 (two) times daily.      . pantoprazole (PROTONIX) 40 MG tablet Take 1 tablet (40 mg total) by mouth daily. 30 minutes before breakfast  90 tablet  3  . Probiotic Product (PROBIOTIC FORMULA PO) Take 1 capsule by mouth daily.      . simvastatin (ZOCOR) 20 MG tablet Take 20 mg by mouth daily.        Review of Systems Review of Systems  Constitutional: Negative for fever, chills and unexpected weight change.  HENT: Negative for hearing loss, congestion, sore throat, trouble swallowing and voice change.   Eyes: Negative for visual disturbance.  Respiratory: Negative for cough, shortness  of breath and wheezing.   Cardiovascular: Negative for chest pain, palpitations and leg swelling.  Gastrointestinal: Negative for nausea, vomiting, abdominal pain, diarrhea, constipation, blood in stool, abdominal distention and anal bleeding.  Genitourinary: Negative for hematuria, vaginal bleeding and difficulty urinating.  Musculoskeletal: Negative for arthralgias.  Skin: Negative for rash and wound.  Neurological: Negative for seizures, syncope and headaches.  Hematological: Negative for adenopathy. Does not bruise/bleed easily.  Psychiatric/Behavioral: Negative for confusion.    Blood pressure 138/86, pulse 60, temperature 96.4 F (35.8 C), resp. rate 18, height 5\' 8"  (1.727 m), weight 261 lb 6.4 oz (118.57 kg).  Physical Exam Physical Exam  Constitutional: She is oriented to person, place, and  time. She appears well-developed and well-nourished. No distress.  HENT:  Head: Normocephalic and atraumatic.  Nose: Nose normal.  Mouth/Throat: No oropharyngeal exudate.  Eyes: Conjunctivae and EOM are normal. Pupils are equal, round, and reactive to light. Left eye exhibits no discharge. No scleral icterus.  Neck: Neck supple. No JVD present. No tracheal deviation present. No thyromegaly present.  Cardiovascular: Normal rate, regular rhythm, normal heart sounds and intact distal pulses.   No murmur heard. Pulmonary/Chest: Effort normal and breath sounds normal. No respiratory distress. She has no wheezes. She has no rales. She exhibits no tenderness.  Abdominal: Soft. Bowel sounds are normal. She exhibits no distension and no mass. There is no tenderness. There is no rebound and no guarding.  Genitourinary:       Perianal skin shows mild pruritus aniThere are 2 small open fissures on the left lateral side. There are tiny noninflamed nontender external hemorrhoids left lateral and posterior midline. No fissure or fistula. Digital rectal exam reveals normal sphincter tone, nontender, no mass. Anoscopy reveals a generous rectal mucosa but no pathologic hemorrhoids.  Musculoskeletal: She exhibits no edema and no tenderness.  Lymphadenopathy:    She has no cervical adenopathy.  Neurological: She is alert and oriented to person, place, and time. She exhibits normal muscle tone. Coordination normal.  Skin: Skin is warm. No rash noted. She is not diaphoretic. No erythema. No pallor.  Psychiatric: She has a normal mood and affect. Her behavior is normal. Judgment and thought content normal.    Data Reviewed Dr. Marvell Fuller notes  Assessment    Pruritus ani with early fissuring left lateral.  Minimal external hemorrhoids, suspect asymptomatic  Obesity  History of esophagitis  History acute diverticulitis.    Plan    I discussed the pathophysiology and anatomical changes of pruritus ani  with the patient. She was given patient information booklet about pruritus ani and hemorrhoids.  We stressed  diet restrictions, perianal hygiene, Moisture control, skin care, and so forth. She understands these issues.  Return to see me if there is not improvement in one month.       Angelia Mould. Derrell Lolling, M.D., [facility] General and Minimally invasive Surgery Breast and Colorectal Surgery Office:   [phone] Pager:   [phone]  05/26/2011, 9:21 AM

## 2011-07-05 ENCOUNTER — Ambulatory Visit
Admission: RE | Admit: 2011-07-05 | Discharge: 2011-07-05 | Disposition: A | Discharge status: Home / Self Care | Payer: 59 | Source: Ambulatory Visit | Attending: Family Medicine | Admitting: Family Medicine

## 2011-07-05 DIAGNOSIS — Z1231 Encounter for screening mammogram for malignant neoplasm of breast: Secondary | ICD-10-CM

## 2011-08-30 ENCOUNTER — Ambulatory Visit: Payer: 59 | Admitting: Internal Medicine

## 2011-09-14 ENCOUNTER — Encounter (INDEPENDENT_AMBULATORY_CARE_PROVIDER_SITE_OTHER): Payer: Self-pay | Admitting: General Surgery

## 2011-09-14 ENCOUNTER — Ambulatory Visit (INDEPENDENT_AMBULATORY_CARE_PROVIDER_SITE_OTHER): Payer: 59 | Admitting: General Surgery

## 2011-09-14 VITALS — BP 138/82 | HR 72 | Temp 98.0°F | Resp 16 | Ht 68.0 in | Wt 273.4 lb

## 2011-09-14 DIAGNOSIS — L290 Pruritus ani: Secondary | ICD-10-CM

## 2011-09-14 DIAGNOSIS — K644 Residual hemorrhoidal skin tags: Secondary | ICD-10-CM

## 2011-09-14 NOTE — Progress Notes (Signed)
Patient ID: Debbie Miranda, female   DOB: Nov 17, 1955, 56 y.o.   MRN: [id number]  History: patient was seen by me on 05/26/2011 with a one-year history of perianal itching and burning. She had severe pruritus and early fissuring but minimal external hemorrhoids. She states that she has religiously followed all of the recommendations but she still has a lot of itching and burning. Symptomatically she is no better.  Exam: Pleasant. Morbidly obese. Rectal exam reveals a perianal skin is much improved. There is no more fissuring. There is no erythema. She has tiny external hemorrhoids. Anoscopy reveals small internal hemorrhoids but no bleeding or inflammatory change. I don't see a recurrent fissure.  Assessment: Pruritus ani, significantly improved by visual exam, but symptomatically still bothered by itching and burning External hemorrhoids, noninflamed  Plan: Analpram-HC 2.5% cream to 3 times a day Refer to Dr. Romie Levee to see if further management would be appropriate.  Angelia Mould. Derrell Lolling, M.D., [facility] General and Minimally invasive Surgery Breast and Colorectal Surgery Office:   [phone] Pager:   [phone]

## 2011-09-14 NOTE — Patient Instructions (Signed)
Your perianal skin has healed a great deal. You had tiny hemorrhoids but there is no infection.  The persistence of itching and burning is not well explained to me.  He will be referred to Dr. Maisie Fus, our new colorectal specialist to see if further management is appropriate.  He had been given a prescription for Analpram-HC 2.5% cream to apply to the anal skin 2 or 3 times a day to control the itching.

## 2011-09-20 ENCOUNTER — Ambulatory Visit (INDEPENDENT_AMBULATORY_CARE_PROVIDER_SITE_OTHER): Payer: 59 | Admitting: General Surgery

## 2011-09-20 ENCOUNTER — Ambulatory Visit (INDEPENDENT_AMBULATORY_CARE_PROVIDER_SITE_OTHER): Payer: 59 | Admitting: Internal Medicine

## 2011-09-20 ENCOUNTER — Encounter: Payer: Self-pay | Admitting: Internal Medicine

## 2011-09-20 VITALS — BP 132/80 | HR 60 | Ht 68.0 in | Wt 274.0 lb

## 2011-09-20 DIAGNOSIS — K5792 Diverticulitis of intestine, part unspecified, without perforation or abscess without bleeding: Secondary | ICD-10-CM

## 2011-09-20 DIAGNOSIS — R1032 Left lower quadrant pain: Secondary | ICD-10-CM

## 2011-09-20 DIAGNOSIS — L290 Pruritus ani: Secondary | ICD-10-CM

## 2011-09-20 DIAGNOSIS — K219 Gastro-esophageal reflux disease without esophagitis: Secondary | ICD-10-CM

## 2011-09-20 DIAGNOSIS — K5732 Diverticulitis of large intestine without perforation or abscess without bleeding: Secondary | ICD-10-CM

## 2011-09-20 NOTE — Patient Instructions (Addendum)
Please stop your Pantoprazole and see if that helps your loose bowels, if not may need to get Dr. Gerda Diss to check your Thyroid with a TSH test.  You may stop your flax seed oil if you decide to and stop your probiotic.  Consult with Dr. Gerda Diss about the need for garlic, and fish oil.  If you develop heartburn or upper abdominal pain call us back about restarting a PPI.  Continue the GERD diet please. A handout is given to you today for a guide.  Follow-up as needed.  Thank you for choosing me and [facility].  Iva Boop, M.D., [facility]

## 2011-09-20 NOTE — Progress Notes (Signed)
  Subjective:    Patient ID: Debbie Miranda, female    DOB: Jan 05, 1956, 56 y.o.   MRN: [id number]  HPI The patient returns for followup of GERD symptoms, history of diverticulitis, and pruritus ani.  She is not having any heartburn or epigastric pain. She has been on pantoprazole for 3 months. She would like to stop it.  The pruritus ani has persisted, she has seen Dr. Derrell Lolling of surgery, he has prescribed pramoxine HC. That provides relief but when she doesn't use it she still has the problem. She has been able to stop over-wiping the anal area but is still symptomatic. She is going to see the new colorectal surgeon.  She is noting some early morning urgent defecation. This is new since starting the pantoprazole. She did have thyroid surgery and was started on Synthroid, initially she thought that she had not had a check of her TSH after the Synthroid dose was increased once but then recall that she did. So she's been on a stable dose for some time. Progress she is on fish oil supplements and flaxseed oil she has been on for about 5 years after her husband had bypass surgery though she has no known coronary artery disease issues. She is on a probiotic supplement that she does not think really helps.  She i also concerned that she might have a hernia in the left abdomen if she has some pain in between the left flank and left umbilical area more in the left lower quadrant mid region.  Medications, allergies, past medical history, past surgical history, family history and social history are reviewed and updated in the EMR.  Review of Systems As above    Objective:   Physical Exam WDWN, obese, NAD Abd: obese, soft, nontender, no hernia in Left quadrant - supine with muscle tension and standing     Assessment & Plan:   1. GERD (gastroesophageal reflux disease)   2. Pruritus ani   3. Diverticulitis   4. Abdominal pain, left lower quadrant    1. Overall she is improved 2. She will stop the  pantoprazole and see how it goes. She had a few erosions on endoscopy in the past. She might need a chronic PPI therapy though it could be related to urgent defecation. We will see how she does, she might not need a high-dose PPI in the future if she has recurrent problems. 3. Await the colorectal surgeons opinion regarding her pruritus ani and skin tags the 4. Diverticulitis is asymptomatic she knows to call back if this recurs, last colonoscopy 2007 (small distal hyperplastic polyp - Dr. Karilyn Cota) so routine colonoscopy not due till 2017 5. Should she have persistent urgent defecation issues I think a recheck of her TSH is reasonable if she can do that through primary care. She can stop a probiotic, and she can discuss whether she really needs fish oil and flaxseed oil with her primary care physician, based upon what I know about her I don't think those are especially indicated or needed.  I appreciate the opportunity to care for this patient.   CC: LUKING,SCOTT, MDand Romie Levee, MD

## 2011-09-21 ENCOUNTER — Ambulatory Visit (INDEPENDENT_AMBULATORY_CARE_PROVIDER_SITE_OTHER): Payer: 59 | Admitting: General Surgery

## 2011-09-21 ENCOUNTER — Encounter (INDEPENDENT_AMBULATORY_CARE_PROVIDER_SITE_OTHER): Payer: Self-pay | Admitting: General Surgery

## 2011-09-21 VITALS — BP 136/88 | HR 70 | Temp 97.9°F | Resp 16 | Ht 68.0 in | Wt 275.5 lb

## 2011-09-21 DIAGNOSIS — L290 Pruritus ani: Secondary | ICD-10-CM

## 2011-09-21 NOTE — Patient Instructions (Addendum)
Pruritus Ani  What is Pruritus Ani (proo-r-tus a-n)? Itching around the anal area is called pruritus ani. This condition results in a compelling urge to scratch. What causes this to happen? Several factors may be at fault. A common cause is excessive moisture in the anal area. Moisture may be due to perspiration or a small amount of residual stool around the anal area. Pruritis ani may be a symptom of other common anal conditions such as hemorrhoids and anal fissures. The initial condition can be made worse by scratching, vigorous cleansing of the area or overuse of topical treatments. In some individuals pruritus ani may be caused by eating certain foods, smoking and drinking alcoholic beverages, especially beer and wine. Food items that have been associated with pruritus ani include: . Coffee, Tea . Carbonated beverages . Milk products . Tomatoes and tomato products such as Ketchup . Cheese . Chocolate . Nuts Does Pruritus Ani result from lack of cleanliness? Cleanliness is almost never a factor. However, the natural tendency once a person develops this itching is to wash the area vigorously and frequently with soap and a washcloth. This almost always makes the problem worse by damaging the skin and washing away protective natural oils. What can be done to make this itching go away? A careful examination by a colon and rectal surgeon or other physician may identify a definite cause for the itching. Your physician can recommend treatment to eliminate the specific problem. Treatment of pruritus ani may include these three points. 1. AVOID MOISTURE in the anal area: . Apply either a few wisps of cotton, a 4 x 4 gauze or some cornstarch powder to keep the area dry. . Avoid all medicated, perfumed and deodorant powders. 2. AVOID FURTHER TRAUMA to the affected area: . Do not use soap of any kind on the anal area. . Do not scrub the anal area with anything - even toilet paper. . For hygiene, it  is best to rinse with warm water and pat the area dry. Use wet toilet paper, baby wipes or a wet washcloth to blot the area clean. Never rub. . Try not to scratch the itchy area. Scratching produces more damage, which in turn makes the itching worse. For individuals that experience irresistible itching at night, wearing socks on the hands may be helpful. 3. USE ONLY MEDICATIONS AS DIRECTED BY YOUR PHYSICIAN. Apply prescription medications sparingly to the skin around the anal area and avoid rubbing. Prolonged use of prescribed or over the counter topical medications may result in irritation or skin dryness that can make the condition worse. How long does this treatment usually take? Most people experience some relief from itching within a week. If symptoms do not resolve after 6 weeks, a follow-up appointment with your colon and rectal surgeon may be needed.               2012 American Society of Colon [employer] Rectal [employer]  

## 2011-09-21 NOTE — Progress Notes (Signed)
CC: anal itching  HISTORY: Debbie Miranda is a 56 y.o. female who presents to the office with puritis ani.  This has occurred for about 2 yrs.  Other symptoms include occasional burning and bleeding.  She has tried diet changes and steroid creams in the past with little permanent success.  In the evening the symptoms are worse.  Taking a bath helps for a few hours.  She uses wet wipes.  It is rather continuous in nature.  Her bowel habits are regular and her bowel movements are soft.  Her fiber intake is good. She is due for another colonoscopy in 2017 per pt.  She does complain of some excess tissue which doesn't reduce.        Past Medical History  Diagnosis Date  . Esophageal reflux   . Hyperlipidemia   .    Marland Kitchen Headache   . Obesity   . Costochondritis   . External hemorrhoids without mention of complication   . Diverticulitis       Past Surgical History  Procedure Date  . Esophagogastroduodenoscopy   . Appendectomy   . Abdominal hysterectomy   . Tonsillectomy   . Left oophorectomy   . Thyroid surgery     nodule and partial thyroidectomy  . Colonoscopy         Current Outpatient Prescriptions  Medication Sig Dispense Refill  . aspirin 81 MG tablet Take 81 mg by mouth daily.      . cholecalciferol (VITAMIN D) 1000 UNITS tablet Take 1,000 Units by mouth daily.      . Garlic 1000 MG CAPS Take 1 capsule by mouth daily.      . hydrocortisone-pramoxine (ANALPRAM-HC) 2.5-1 % rectal cream       . levothyroxine (SYNTHROID, LEVOTHROID) 88 MCG tablet Take 88 mcg by mouth daily.      . Multiple Vitamins-Minerals (CENTRUM SILVER) tablet Take 1 tablet by mouth daily.      . Omega-3 Fatty Acids (FISH OIL) 1200 MG CAPS Take 1 capsule by mouth 2 (two) times daily.      . simvastatin (ZOCOR) 20 MG tablet Take 20 mg by mouth daily.      . Flaxseed, Linseed, (FLAXSEED OIL) 1000 MG CAPS Take 1,000 mg by mouth.      . pantoprazole (PROTONIX) 40 MG tablet Take 40 mg by mouth daily.      .  Probiotic Product (PROBIOTIC FORMULA PO) Take 1 capsule by mouth daily.          Allergies  Allergen Reactions  . Codeine       Family History  Problem Relation Age of Onset  . Kidney cancer Father   . Breast cancer Maternal Grandmother   . Colon cancer Neg Hx   . Heart disease Maternal Grandfather     History   Social History  . Marital Status: Married    Spouse Name: N/A    Number of Children: 1  . Years of Education: N/A   Occupational History  . homemaker    Social History Main Topics  . Smoking status: Never Smoker   . Smokeless tobacco: Never Used  . Alcohol Use: Yes     wine  . Drug Use: No  . Sexually Active: None   Other Topics Concern  . None   Social History Narrative  . None      REVIEW OF SYSTEMS - PERTINENT POSITIVES ONLY: Review of Systems - General ROS: negative for - chills, fever or weight  loss Allergy and Immunology ROS: negative for - no rashes Hematological and Lymphatic ROS: negative for - bleeding problems or blood clots Respiratory ROS: no cough, shortness of breath, or wheezing Cardiovascular ROS: no chest pain or dyspnea on exertion Gastrointestinal ROS: positive for - abdominal pain negative for - change in bowel habits or change in stools Genito-Urinary ROS: no dysuria, trouble voiding, or hematuria  EXAM: Filed Vitals:   09/21/11 0946  BP: 136/88  Pulse: 70  Temp: 97.9 F (36.6 C)  Resp: 16    General appearance: alert and no distress Resp: clear to auscultation bilaterally Cardio: regular rate and rhythm GI: slight tenderness to palpation just left of umbilicus, no hernia palpated  Procedure: Anoscopy Surgeon: Maisie Fus Diagnosis: Puritis Ani  Assistant: Huntley Dec, RN After the risks and benefits were explained, verbal consent was obtained for above procedure  Anesthesia: none Findings: good sphincter tone, R post anal skin tag, small area of inflammation on R post hemorrhoid.  Small internal hemorrhoids, grade 2, R post  is largest  ASSESSMENT AND PLAN: Debbie Miranda is a 56 y.o. female who presents to clinic with puritis ani.  This is a chronic problem.  She has been treated by Dr Derrell Lolling in the past and diet changes have not worked for her.  It appears from her history and exam that her itching is likely from a moisture problem.  I also wonder if the wipes she is using is also causing some irritation, since she states they burn when she wipes.  I have instructed her to use wet 100% cotton to wipe.  She will also use a dry piece of cotton over her anus during the day to keep things dry.  I also toldher that she could use desitin ointment to protect her skin while it was healing.  She will try these things and follow up with me in about a month.  I did discuss other treatment options that we could try if this did not work, including methylene blue injection as a last resort.    Vanita Panda, MD Colon and Rectal Surgery / General Surgery [facility]      Visit Diagnoses: 1. Anal itching     Primary Care Physician: Lilyan Punt, MD

## 2011-10-27 ENCOUNTER — Encounter (INDEPENDENT_AMBULATORY_CARE_PROVIDER_SITE_OTHER): Payer: Self-pay | Admitting: General Surgery

## 2011-10-27 ENCOUNTER — Ambulatory Visit (INDEPENDENT_AMBULATORY_CARE_PROVIDER_SITE_OTHER): Payer: 59 | Admitting: General Surgery

## 2011-10-27 VITALS — BP 110/70 | HR 72 | Temp 99.2°F | Resp 18 | Ht 68.0 in | Wt 268.5 lb

## 2011-10-27 DIAGNOSIS — L290 Pruritus ani: Secondary | ICD-10-CM

## 2011-10-27 HISTORY — DX: Pruritus ani: L29.0

## 2011-10-27 MED ORDER — LIDOCAINE-PRILOCAINE 2.5-2.5 % EX CREA: TOPICAL_CREAM | CUTANEOUS | Status: DC | PRN

## 2011-10-27 NOTE — Progress Notes (Signed)
Debbie Miranda is a 56 y.o. female who is here for a follow up visit regarding her anal itching.  She has tried dietary modifications and keeping the area dry, but she still has itching.    Objective: Filed Vitals:   10/27/11 1403  BP: 110/70  Pulse: 72  Temp: 99.2 F (37.3 C)  Resp: 18    General appearance: alert, cooperative and no distress Abd: soft  Procedure: Anoscopy Surgeon: Maisie Fus Assistant: Christella Scheuermann After the risks and benefits were explained, verbal consent was obtained for above procedure  Anesthesia: none Diagnosis: anal puritis Findings: most itching appears centered around the L lateral skin tag, grade 1 internal hemorrhoids  Assessment and Plan: Debbie Miranda a 56 year old female who is here in the office for followup regarding her anal pruritus. She states that it has not gotten better even there her skin has improved with the methods for keeping her anal canal dry. On exam her skin does look better but she is still having itching. I feel that it is necessary to remove her skin tags as these might be the cause of her itching. The risk and benefits have been explained to the patient.  These include bleeding, infection, anal pain and anal scarring. I believe that she would be a good candidate for surgery. We will proceed with this in early December.    Vanita Panda, MD [facility], [place] [phone]

## 2011-10-27 NOTE — Patient Instructions (Signed)
I have prescribed a cream for you to try for the next few weeks.  We will get your surgery scheduled for early Dec.      Pruritus Ani  What is Pruritus Ani (proo-r-tus a-n)? Itching around the anal area is called pruritus ani. This condition results in a compelling urge to scratch. What causes this to happen? Several factors may be at fault. A common cause is excessive moisture in the anal area. Moisture may be due to perspiration or a small amount of residual stool around the anal area. Pruritis ani may be a symptom of other common anal conditions such as hemorrhoids and anal fissures. The initial condition can be made worse by scratching, vigorous cleansing of the area or overuse of topical treatments. In some individuals pruritus ani may be caused by eating certain foods, smoking and drinking alcoholic beverages, especially beer and wine. Food items that have been associated with pruritus ani include: . Coffee, Tea . Carbonated beverages . Milk products . Tomatoes and tomato products such as Ketchup . Cheese . Chocolate . Nuts Does Pruritus Ani result from lack of cleanliness? Cleanliness is almost never a factor. However, the natural tendency once a person develops this itching is to wash the area vigorously and frequently with soap and a washcloth. This almost always makes the problem worse by damaging the skin and washing away protective natural oils. What can be done to make this itching go away? A careful examination by a colon and rectal surgeon or other physician may identify a definite cause for the itching. Your physician can recommend treatment to eliminate the specific problem. Treatment of pruritus ani may include these three points. 1. AVOID MOISTURE in the anal area: . Apply either a few wisps of cotton, a 4 x 4 gauze or some cornstarch powder to keep the area dry. . Avoid all medicated, perfumed and deodorant powders. 2. AVOID FURTHER TRAUMA to the affected area: Marland Kitchen Do  not use soap of any kind on the anal area. . Do not scrub the anal area with anything - even toilet paper. . For hygiene, it is best to rinse with warm water and pat the area dry. Use wet toilet paper, baby wipes or a wet washcloth to blot the area clean. Never rub. . Try not to scratch the itchy area. Scratching produces more damage, which in turn makes the itching worse. For individuals that experience irresistible itching at night, wearing socks on the hands may be helpful. 3. USE ONLY MEDICATIONS AS DIRECTED BY YOUR PHYSICIAN. Apply prescription medications sparingly to the skin around the anal area and avoid rubbing. Prolonged use of prescribed or over the counter topical medications may result in irritation or skin dryness that can make the condition worse. How long does this treatment usually take? Most people experience some relief from itching within a week. If symptoms do not resolve after 6 weeks, a follow-up appointment with your colon and rectal surgeon may be needed.               2012 [employer] Colon [employer]

## 2011-10-28 ENCOUNTER — Telehealth: Payer: Self-pay | Admitting: Internal Medicine

## 2011-10-28 DIAGNOSIS — L290 Pruritus ani: Secondary | ICD-10-CM

## 2011-10-28 NOTE — Telephone Encounter (Signed)
She could possibly have pinworms. See if lab can help [place] check for these I am not sure how to test for that.

## 2011-10-28 NOTE — Telephone Encounter (Signed)
Patient wants to make sure prior to having her skin tags removed by a surgeon that there is not a possibility of having a parasite.  She wants to make sure that the anal itching is the skin tags and not any other cause.  She denies diarrhea or other complaints.  Dr. Leone Payor please review and advise

## 2011-10-29 NOTE — Telephone Encounter (Signed)
Patient advised.  She says she will try the scotch tape test tonight.  If she sees any worms or eggs in the am, she will come for the collection container.

## 2011-10-29 NOTE — Telephone Encounter (Signed)
I think you need a microscope to see the eggs , ? Worms  May still need to submit it

## 2011-10-29 NOTE — Telephone Encounter (Signed)
According to [facility] and CDC they should be easily visible.  The worms are 1/4 to 1/2 inch and the eggs are visible to the naked eye.

## 2011-10-29 NOTE — Telephone Encounter (Signed)
I spoke with [facility] there is a pinworm exam.  She must collect it using the scotch tape method.  Left message for patient to call back

## 2011-11-16 ENCOUNTER — Encounter (HOSPITAL_BASED_OUTPATIENT_CLINIC_OR_DEPARTMENT_OTHER): Payer: Self-pay | Admitting: *Deleted

## 2011-11-16 NOTE — Progress Notes (Signed)
Pt instructed npo p mn 11/6 x levothroxine w sip of water.  To [facility] 11/7 @ 0745.  Needs hgb, ekg on arrival

## 2011-11-18 ENCOUNTER — Encounter (HOSPITAL_BASED_OUTPATIENT_CLINIC_OR_DEPARTMENT_OTHER): Payer: Self-pay | Admitting: Anesthesiology

## 2011-11-18 ENCOUNTER — Ambulatory Visit (HOSPITAL_BASED_OUTPATIENT_CLINIC_OR_DEPARTMENT_OTHER)
Admission: RE | Admit: 2011-11-18 | Discharge: 2011-11-18 | Disposition: A | Discharge status: Home / Self Care | Payer: 59 | Source: Ambulatory Visit | Attending: General Surgery | Admitting: General Surgery

## 2011-11-18 ENCOUNTER — Encounter (HOSPITAL_BASED_OUTPATIENT_CLINIC_OR_DEPARTMENT_OTHER): Payer: Self-pay | Admitting: *Deleted

## 2011-11-18 ENCOUNTER — Encounter (HOSPITAL_BASED_OUTPATIENT_CLINIC_OR_DEPARTMENT_OTHER)
Admission: RE | Disposition: A | Discharge status: Home / Self Care | Payer: Self-pay | Source: Ambulatory Visit | Attending: General Surgery

## 2011-11-18 ENCOUNTER — Ambulatory Visit (HOSPITAL_BASED_OUTPATIENT_CLINIC_OR_DEPARTMENT_OTHER): Payer: 59 | Admitting: Anesthesiology

## 2011-11-18 DIAGNOSIS — K644 Residual hemorrhoidal skin tags: Secondary | ICD-10-CM | POA: Insufficient documentation

## 2011-11-18 DIAGNOSIS — L919 Hypertrophic disorder of the skin, unspecified: Secondary | ICD-10-CM | POA: Insufficient documentation

## 2011-11-18 DIAGNOSIS — L909 Atrophic disorder of skin, unspecified: Secondary | ICD-10-CM | POA: Insufficient documentation

## 2011-11-18 DIAGNOSIS — K648 Other hemorrhoids: Secondary | ICD-10-CM | POA: Insufficient documentation

## 2011-11-18 DIAGNOSIS — K649 Unspecified hemorrhoids: Secondary | ICD-10-CM

## 2011-11-18 HISTORY — DX: Other specified postprocedural states: R11.2

## 2011-11-18 HISTORY — DX: Unspecified osteoarthritis, unspecified site: M19.90

## 2011-11-18 HISTORY — DX: Other specified postprocedural states: Z98.890

## 2011-11-18 HISTORY — PX: EXCISION OF SKIN TAG: SHX6270

## 2011-11-18 HISTORY — PX: HEMORRHOID SURGERY: SHX153

## 2011-11-18 HISTORY — DX: Hypothyroidism, unspecified: E03.9

## 2011-11-18 LAB — POCT HEMOGLOBIN-HEMACUE: Hemoglobin: 14.3 g/dL (ref 12.0–15.0)

## 2011-11-18 SURGERY — EXCISION, SKIN TAG
Anesthesia: General | Site: Anus

## 2011-11-18 MED ORDER — BUPIVACAINE-EPINEPHRINE 0.5% -1:200000 IJ SOLN
INTRAMUSCULAR | Status: DC | PRN
  Administered 2011-11-18: 20 mL

## 2011-11-18 MED ORDER — NEOSTIGMINE METHYLSULFATE 1 MG/ML IJ SOLN
INTRAMUSCULAR | Status: DC | PRN
  Administered 2011-11-18: 4 mg via INTRAVENOUS

## 2011-11-18 MED ORDER — KETOROLAC TROMETHAMINE 30 MG/ML IJ SOLN
INTRAMUSCULAR | Status: DC | PRN
  Administered 2011-11-18: 30 mg via INTRAVENOUS

## 2011-11-18 MED ORDER — LACTATED RINGERS IV SOLN
INTRAVENOUS | Status: DC | PRN
  Administered 2011-11-18 (×2): via INTRAVENOUS

## 2011-11-18 MED ORDER — ROCURONIUM BROMIDE 100 MG/10ML IV SOLN
INTRAVENOUS | Status: DC | PRN
  Administered 2011-11-18: 20 mg via INTRAVENOUS

## 2011-11-18 MED ORDER — FENTANYL CITRATE 0.05 MG/ML IJ SOLN
25.0000 ug | INTRAMUSCULAR | Status: DC | PRN
  Filled 2011-11-18: qty 1

## 2011-11-18 MED ORDER — DEXAMETHASONE SODIUM PHOSPHATE 4 MG/ML IJ SOLN
INTRAMUSCULAR | Status: DC | PRN
  Administered 2011-11-18: 10 mg via INTRAVENOUS

## 2011-11-18 MED ORDER — PROMETHAZINE HCL 12.5 MG PO TABS: 12.5000 mg | ORAL_TABLET | Freq: Four times a day (QID) | ORAL | Status: DC | PRN

## 2011-11-18 MED ORDER — PROMETHAZINE HCL 25 MG/ML IJ SOLN
6.2500 mg | INTRAMUSCULAR | Status: DC | PRN
  Filled 2011-11-18: qty 1

## 2011-11-18 MED ORDER — SCOPOLAMINE 1 MG/3DAYS TD PT72
1.0000 | MEDICATED_PATCH | TRANSDERMAL | Status: DC
  Administered 2011-11-18: 1.5 mg via TRANSDERMAL

## 2011-11-18 MED ORDER — OXYCODONE HCL 5 MG PO TABS: 5.0000 mg | ORAL_TABLET | ORAL | Status: DC | PRN

## 2011-11-18 MED ORDER — PROMETHAZINE HCL 12.5 MG PO TABS
12.5000 mg | ORAL_TABLET | Freq: Four times a day (QID) | ORAL | Status: AC | PRN
  Administered 2011-11-18: 12.5 mg via ORAL
  Filled 2011-11-18: qty 1

## 2011-11-18 MED ORDER — SCOPOLAMINE 1 MG/3DAYS TD PT72
1.0000 | MEDICATED_PATCH | TRANSDERMAL | Status: DC
  Filled 2011-11-18: qty 1

## 2011-11-18 MED ORDER — NALOXONE HCL 0.4 MG/ML IJ SOLN
INTRAMUSCULAR | Status: DC | PRN
  Administered 2011-11-18: 120 ug via INTRAVENOUS

## 2011-11-18 MED ORDER — DOCUSATE SODIUM 100 MG PO CAPS: 100.0000 mg | ORAL_CAPSULE | Freq: Two times a day (BID) | ORAL | Status: DC

## 2011-11-18 MED ORDER — METOCLOPRAMIDE HCL 5 MG/ML IJ SOLN
INTRAMUSCULAR | Status: DC | PRN
  Administered 2011-11-18: 10 mg via INTRAVENOUS

## 2011-11-18 MED ORDER — ASPIRIN 81 MG PO TABS: 81.0000 mg | ORAL_TABLET | Freq: Every day | ORAL | Status: DC

## 2011-11-18 MED ORDER — LIDOCAINE HCL (CARDIAC) 20 MG/ML IV SOLN
INTRAVENOUS | Status: DC | PRN
  Administered 2011-11-18: 100 mg via INTRAVENOUS

## 2011-11-18 MED ORDER — LACTATED RINGERS IV SOLN
INTRAVENOUS | Status: DC
  Administered 2011-11-18 (×2): via INTRAVENOUS
  Filled 2011-11-18: qty 1000

## 2011-11-18 MED ORDER — SUCCINYLCHOLINE CHLORIDE 20 MG/ML IJ SOLN
INTRAMUSCULAR | Status: DC | PRN
  Administered 2011-11-18: 140 mg via INTRAVENOUS

## 2011-11-18 MED ORDER — GLYCOPYRROLATE 0.2 MG/ML IJ SOLN
INTRAMUSCULAR | Status: DC | PRN
  Administered 2011-11-18: 0.6 mg via INTRAVENOUS

## 2011-11-18 MED ORDER — ONDANSETRON HCL 4 MG/2ML IJ SOLN
INTRAMUSCULAR | Status: DC | PRN
  Administered 2011-11-18: 4 mg via INTRAVENOUS

## 2011-11-18 MED ORDER — MIDAZOLAM HCL 5 MG/5ML IJ SOLN
INTRAMUSCULAR | Status: DC | PRN
  Administered 2011-11-18: 2 mg via INTRAVENOUS

## 2011-11-18 MED ORDER — PSYLLIUM 28 % PO PACK: 1.0000 | PACK | Freq: Two times a day (BID) | ORAL | Status: DC

## 2011-11-18 MED ORDER — KETOROLAC TROMETHAMINE 30 MG/ML IJ SOLN
15.0000 mg | Freq: Once | INTRAMUSCULAR | Status: DC | PRN
  Filled 2011-11-18: qty 1

## 2011-11-18 MED ORDER — DIAZEPAM 5 MG PO TABS: 5.0000 mg | ORAL_TABLET | Freq: Four times a day (QID) | ORAL | Status: DC | PRN

## 2011-11-18 MED ORDER — ACETAMINOPHEN 10 MG/ML IV SOLN
1000.0000 mg | Freq: Four times a day (QID) | INTRAVENOUS | Status: DC
  Administered 2011-11-18: 1000 mg via INTRAVENOUS
  Filled 2011-11-18: qty 100

## 2011-11-18 MED ORDER — FENTANYL CITRATE 0.05 MG/ML IJ SOLN
INTRAMUSCULAR | Status: DC | PRN
  Administered 2011-11-18: 50 ug via INTRAVENOUS
  Administered 2011-11-18: 100 ug via INTRAVENOUS
  Administered 2011-11-18 (×3): 50 ug via INTRAVENOUS

## 2011-11-18 MED ORDER — PROPOFOL 10 MG/ML IV BOLUS
INTRAVENOUS | Status: DC | PRN
  Administered 2011-11-18: 250 mg via INTRAVENOUS

## 2011-11-18 SURGICAL SUPPLY — 42 items
ADH SKN CLS APL DERMABOND .7 (GAUZE/BANDAGES/DRESSINGS)
APL SKNCLS STERI-STRIP NONHPOA (GAUZE/BANDAGES/DRESSINGS)
BENZOIN TINCTURE PRP APPL 2/3 (GAUZE/BANDAGES/DRESSINGS) IMPLANT
BLADE SURG 15 STRL LF DISP TIS (BLADE) ×2 IMPLANT
BLADE SURG 15 STRL SS (BLADE) ×2
BLADE SURG ROTATE 9660 (MISCELLANEOUS) IMPLANT
CHLORAPREP W/TINT 26ML (MISCELLANEOUS) IMPLANT
COVER MAYO STAND STRL (DRAPES) ×3 IMPLANT
COVER TABLE BACK 60X90 (DRAPES) ×3 IMPLANT
DECANTER SPIKE VIAL GLASS SM (MISCELLANEOUS) IMPLANT
DERMABOND ADVANCED (GAUZE/BANDAGES/DRESSINGS)
DERMABOND ADVANCED .7 DNX12 (GAUZE/BANDAGES/DRESSINGS) IMPLANT
DRAPE PED LAPAROTOMY (DRAPES) ×3 IMPLANT
DRSG TEGADERM 4X4.75 (GAUZE/BANDAGES/DRESSINGS) IMPLANT
ELECT REM PT RETURN 9FT ADLT (ELECTROSURGICAL) ×3
ELECTRODE REM PT RTRN 9FT ADLT (ELECTROSURGICAL) ×2 IMPLANT
GLOVE BIO SURGEON STRL SZ 6.5 (GLOVE) ×6 IMPLANT
GLOVE ECLIPSE 6.0 STRL STRAW (GLOVE) ×3 IMPLANT
GLOVE INDICATOR 7.0 STRL GRN (GLOVE) ×3 IMPLANT
GOWN PREVENTION PLUS XLARGE (GOWN DISPOSABLE) IMPLANT
GOWN PREVENTION PLUS XXLARGE (GOWN DISPOSABLE) ×3 IMPLANT
GOWN STRL NON-REIN LRG LVL3 (GOWN DISPOSABLE) ×3 IMPLANT
NEEDLE HYPO 25X1 1.5 SAFETY (NEEDLE) ×3 IMPLANT
NS IRRIG 1000ML POUR BTL (IV SOLUTION) IMPLANT
PENCIL BUTTON HOLSTER BLD 10FT (ELECTRODE) ×3 IMPLANT
SPONGE GAUZE 4X4 12PLY (GAUZE/BANDAGES/DRESSINGS) IMPLANT
STRIP CLOSURE SKIN 1/2X4 (GAUZE/BANDAGES/DRESSINGS) IMPLANT
SUT CHROMIC 2 0 SH (SUTURE) ×3 IMPLANT
SUT CHROMIC 3 0 SH 27 (SUTURE) ×6 IMPLANT
SUT ETHILON 2 0 FS 18 (SUTURE) IMPLANT
SUT ETHILON 4 0 PS 2 18 (SUTURE) IMPLANT
SUT MNCRL AB 4-0 PS2 18 (SUTURE) IMPLANT
SUT SILK 2 0 SH (SUTURE) IMPLANT
SUT VIC AB 2-0 SH 27 (SUTURE)
SUT VIC AB 2-0 SH 27XBRD (SUTURE) IMPLANT
SUT VICRYL 4-0 PS2 18IN ABS (SUTURE) IMPLANT
SYR CONTROL 10ML LL (SYRINGE) ×3 IMPLANT
TOWEL OR 17X24 6PK STRL BLUE (TOWEL DISPOSABLE) ×6 IMPLANT
TUBE ANAEROBIC SPECIMEN COL (MISCELLANEOUS) IMPLANT
UNDERPAD 30X30 INCONTINENT (UNDERPADS AND DIAPERS) IMPLANT
WATER STERILE IRR 1000ML POUR (IV SOLUTION) ×3 IMPLANT
YANKAUER SUCT BULB TIP NO VENT (SUCTIONS) IMPLANT

## 2011-11-18 NOTE — Anesthesia Procedure Notes (Signed)
Procedure Name: Intubation Date/Time: 11/18/2011 8:45 AM Performed by: Jessica Priest Pre-anesthesia Checklist: Patient identified, Emergency Drugs available, Suction available and Patient being monitored Patient Re-evaluated:Patient Re-evaluated prior to inductionOxygen Delivery Method: Circle System Utilized Preoxygenation: Pre-oxygenation with 100% oxygen Intubation Type: IV induction Ventilation: Mask ventilation without difficulty Laryngoscope Size: Mac and 4 Tube type: Oral Tube size: 8.0 mm Number of attempts: 1 Airway Equipment and Method: stylet and oral airway Placement Confirmation: ETT inserted through vocal cords under direct vision,  positive ETCO2 and breath sounds checked- equal and bilateral Tube secured with: Tape Dental Injury: Teeth and Oropharynx as per pre-operative assessment

## 2011-11-18 NOTE — H&P (View-Only) (Signed)
Debbie Miranda is a 55 y.o. female who is here for a follow up visit regarding her anal itching.  She has tried dietary modifications and keeping the area dry, but she still has itching.    Objective: Filed Vitals:   10/27/11 1403  BP: 110/70  Pulse: 72  Temp: 99.2 F (37.3 C)  Resp: 18    General appearance: alert, cooperative and no distress Abd: soft  Procedure: Anoscopy Surgeon: Shacola Schussler Assistant: Glaspey After the risks and benefits were explained, verbal consent was obtained for above procedure  Anesthesia: none Diagnosis: anal puritis Findings: most itching appears centered around the L lateral skin tag, grade 1 internal hemorrhoids  Assessment and Plan: Debbie M Whelessis a 55-year-old female who is here in the office for followup regarding her anal pruritus. She states that it has not gotten better even there her skin has improved with the methods for keeping her anal canal dry. On exam her skin does look better but she is still having itching. I feel that it is necessary to remove her skin tags as these might be the cause of her itching. The risk and benefits have been explained to the patient.  These include bleeding, infection, anal pain and anal scarring. I believe that she would be a good candidate for surgery. We will proceed with this in early December.    .Debbie Miranda [name], MD [facility] [phone]        

## 2011-11-18 NOTE — Transfer of Care (Signed)
Immediate Anesthesia Transfer of Care Note  Patient: Debbie Miranda  Procedure(s) Performed: Procedure(s) (LRB): EXCISION OF SKIN TAG (N/A) HEMORRHOIDECTOMY ()  Patient Location: PACU  Anesthesia Type: General  Level of Consciousness: awake, sedated, patient cooperative and responds to stimulation  Airway & Oxygen Therapy: Patient Spontanous Breathing and Patient connected to face mask oxygen  Post-op Assessment: Report given to PACU RN, Post -op Vital signs reviewed and stable and Patient moving all extremities  Post vital signs: Reviewed and stable  Complications: No apparent anesthesia complications

## 2011-11-18 NOTE — Interval H&P Note (Signed)
History and Physical Interval Note:  11/18/2011 8:27 AM  Debbie Miranda  has presented today for surgery, with the diagnosis of anal skin tag  The various methods of treatment have been discussed with the patient and family. After consideration of risks, benefits and other options for treatment, the patient has consented to  Procedure(s) (LRB) with comments: EXCISION OF SKIN TAG (N/A) as a surgical intervention .  Specific risks of bleeding, post-operative pain and recurrence of itching.  The patient's history has been reviewed, patient examined, no change in status, stable for surgery.  I have reviewed the patient's chart and labs.  Questions were answered to the patient's satisfaction.    Vanita Panda, MD  Colorectal and General Surgery [facility]

## 2011-11-18 NOTE — Op Note (Addendum)
11/18/2011  8:47 AM  PATIENT:  Debbie Miranda  56 y.o. female  Patient Care Team: Babs Sciara, MD as PCP - General (Family Medicine)  PRE-OPERATIVE DIAGNOSIS:  anal skin tag  POST-OPERATIVE DIAGNOSIS:  anal skin tag, left lateral hemorrhoid  PROCEDURE:  Procedure(s): EXCISION OF SKIN TAG  SURGEON:  Surgeon(s): Romie Levee, MD  ASSISTANT: none   ANESTHESIA:   local and general  EBL:     SPECIMEN:  Source of Specimen:  L lateral external hemorrhoid Posterior anal skin tag  DISPOSITION OF SPECIMEN:  PATHOLOGY  COUNTS:  YES  PLAN OF CARE: Discharge to home after PACU  PATIENT DISPOSITION:  PACU - hemodynamically stable.  INDICATION: This is a 56 y.o. female who presented to our office with intractable anal itching.  She has tried dietary modifications and skin management without much success.  She has a moderate skin tag that could be causing the itching.  She mostly fills the itching on the left side.  We have decided to remove this.   OR FINDINGS: posterior anal skin tag, small internal hemorrhoids, left external hemorrhoid  DESCRIPTION: The patient was brought to the OR and general anesthesia was induced.  She was turned prone in the jack-knife position.  Her buttocks were gently taped apart and she was prepped and draped in the usual sterile fashion.  A surgical time out was performed indicating the correct patient, position and procedure.  I began with an internal anal examination using the anoscope.  She had no signs of abscess or fistula. She has small internal hemorrhoids in all 3 positions. She had a relatively prominent left external hemorrhoid and a posterior anal skin tag.  Given her itching on the left side, I decided to remove the left exterior hemorrhoid.  A scalpel was used to make the skin incision and the remainder of the hemorrhoid was dissected using Metzenbaum scissors. Hemostasis was achieved with a running 3-0 chromic suture. This was sent to pathology  for further examination. I then turned my attention to the posterior skin tag. An elliptical incision was made around the skin tag. The skin tag was removed. There were was some bleeding at the base which was cauterized. A 2-0 chromic suture in running fashion was used to close this. After this a rectal field block was used using Marcaine with epinephrine. Sterile fluffs and a dressing were applied. The patient was then awakened from anesthesia and sent to the postanesthesia care unit in stable condition. All counts were correct per operating room staff.

## 2011-11-18 NOTE — Anesthesia Postprocedure Evaluation (Signed)
  Anesthesia Post-op Note  Patient: Debbie Miranda  Procedure(s) Performed: Procedure(s) (LRB): EXCISION OF SKIN TAG (N/A) HEMORRHOIDECTOMY ()  Patient Location: PACU  Anesthesia Type: General  Level of Consciousness: awake and alert   Airway and Oxygen Therapy: Patient Spontanous Breathing  Post-op Pain: mild  Post-op Assessment: Post-op Vital signs reviewed, Patient's Cardiovascular Status Stable, Respiratory Function Stable, Patent Airway and No signs of Nausea or vomiting  Post-op Vital Signs: stable  Complications: No apparent anesthesia complications

## 2011-11-18 NOTE — Anesthesia Preprocedure Evaluation (Addendum)
Anesthesia Evaluation  Patient identified by MRN, date of birth, ID band Patient awake    Reviewed: Allergy & Precautions, H&P , NPO status , Patient's Chart, lab work & pertinent test results  History of Anesthesia Complications (+) PONV  Airway Mallampati: III TM Distance: <3 FB Neck ROM: Full    Dental No notable dental hx.    Pulmonary neg pulmonary ROS,  breath sounds clear to auscultation  Pulmonary exam normal       Cardiovascular Rhythm:Regular Rate:Normal     Neuro/Psych negative neurological ROS  negative psych ROS   GI/Hepatic negative GI ROS, Neg liver ROS, GERD-  Medicated,  Endo/Other  Hypothyroidism Morbid obesity  Renal/GU negative Renal ROS  negative genitourinary   Musculoskeletal negative musculoskeletal ROS (+)   Abdominal   Peds negative pediatric ROS (+)  Hematology negative hematology ROS (+)   Anesthesia Other Findings   Reproductive/Obstetrics negative OB ROS                           Anesthesia Physical Anesthesia Plan  ASA: II  Anesthesia Plan: General   Post-op Pain Management:    Induction: Intravenous  Airway Management Planned: Oral ETT  Additional Equipment:   Intra-op Plan:   Post-operative Plan:   Informed Consent: I have reviewed the patients History and Physical, chart, labs and discussed the procedure including the risks, benefits and alternatives for the proposed anesthesia with the patient or authorized representative who has indicated his/her understanding and acceptance.   Dental advisory given  Plan Discussed with: CRNA and Surgeon  Anesthesia Plan Comments:        Anesthesia Quick Evaluation

## 2011-11-22 ENCOUNTER — Encounter (HOSPITAL_BASED_OUTPATIENT_CLINIC_OR_DEPARTMENT_OTHER): Payer: Self-pay | Admitting: General Surgery

## 2011-11-29 ENCOUNTER — Ambulatory Visit (HOSPITAL_COMMUNITY)
Admission: RE | Admit: 2011-11-29 | Discharge: 2011-11-29 | Disposition: A | Discharge status: Home / Self Care | Payer: 59 | Source: Ambulatory Visit | Attending: Family Medicine | Admitting: Family Medicine

## 2011-11-29 DIAGNOSIS — R072 Precordial pain: Secondary | ICD-10-CM

## 2011-11-29 NOTE — Progress Notes (Signed)
*  PRELIMINARY RESULTS* Echocardiogram 2D Echocardiogram has been performed.  Debbie Miranda 11/29/2011, 3:12 PM

## 2011-12-03 ENCOUNTER — Other Ambulatory Visit: Payer: Self-pay | Admitting: Family Medicine

## 2011-12-03 DIAGNOSIS — R222 Localized swelling, mass and lump, trunk: Secondary | ICD-10-CM

## 2011-12-12 HISTORY — PX: LUNG LOBECTOMY: SHX167

## 2011-12-13 ENCOUNTER — Ambulatory Visit (HOSPITAL_COMMUNITY)
Admission: RE | Admit: 2011-12-13 | Discharge: 2011-12-13 | Disposition: A | Discharge status: Home / Self Care | Payer: 59 | Source: Ambulatory Visit | Attending: Family Medicine | Admitting: Family Medicine

## 2011-12-13 DIAGNOSIS — R918 Other nonspecific abnormal finding of lung field: Secondary | ICD-10-CM | POA: Insufficient documentation

## 2011-12-13 DIAGNOSIS — M47814 Spondylosis without myelopathy or radiculopathy, thoracic region: Secondary | ICD-10-CM | POA: Insufficient documentation

## 2011-12-13 DIAGNOSIS — R222 Localized swelling, mass and lump, trunk: Secondary | ICD-10-CM

## 2011-12-14 ENCOUNTER — Ambulatory Visit (INDEPENDENT_AMBULATORY_CARE_PROVIDER_SITE_OTHER): Payer: 59 | Admitting: General Surgery

## 2011-12-14 ENCOUNTER — Encounter (INDEPENDENT_AMBULATORY_CARE_PROVIDER_SITE_OTHER): Payer: Self-pay | Admitting: General Surgery

## 2011-12-14 VITALS — BP 126/78 | HR 72 | Temp 97.8°F | Resp 16 | Ht 68.0 in | Wt 257.4 lb

## 2011-12-14 DIAGNOSIS — L290 Pruritus ani: Secondary | ICD-10-CM

## 2011-12-14 NOTE — Progress Notes (Signed)
Debbie Miranda is a 56 y.o. female who is here for a follow up visit regarding her anal itching.  She is s/p hemorrhoidectomy.  She is doing well.  Her pain is getting better.  She is having minimal itching.    Objective: Filed Vitals:   12/14/11 1144  BP: 126/78  Pulse: 72  Temp: 97.8 F (36.6 C)  Resp: 16    Gen: NAD Anal: posterior region healing, dime sized area of granulation tissue   Assessment and Plan: Will schedule an apt in 4 wks to eval for resolution of symptoms.      Vanita Panda, MD [facility], [place] [phone]

## 2011-12-14 NOTE — Patient Instructions (Signed)
We will schedule an apt in 4 weeks.  You can cancel if you are having no symptoms by then.

## 2011-12-16 ENCOUNTER — Other Ambulatory Visit (HOSPITAL_COMMUNITY): Payer: 59

## 2011-12-16 ENCOUNTER — Ambulatory Visit: Payer: 59 | Admitting: Internal Medicine

## 2011-12-17 ENCOUNTER — Encounter: Payer: Self-pay | Admitting: Internal Medicine

## 2011-12-17 ENCOUNTER — Ambulatory Visit (INDEPENDENT_AMBULATORY_CARE_PROVIDER_SITE_OTHER): Payer: 59 | Admitting: Internal Medicine

## 2011-12-17 VITALS — BP 108/80 | HR 67 | Ht 68.0 in | Wt 254.0 lb

## 2011-12-17 DIAGNOSIS — R002 Palpitations: Secondary | ICD-10-CM

## 2011-12-17 DIAGNOSIS — R079 Chest pain, unspecified: Secondary | ICD-10-CM

## 2011-12-17 DIAGNOSIS — R9431 Abnormal electrocardiogram [ECG] [EKG]: Secondary | ICD-10-CM

## 2011-12-17 DIAGNOSIS — Z136 Encounter for screening for cardiovascular disorders: Secondary | ICD-10-CM

## 2011-12-17 NOTE — Progress Notes (Addendum)
HPI Patient is a 56 yo who ws referred for evaluation of chest discomfort  The patient had been followed in the past by [facility]  She has a history of CP, palpitations, HL and borderline HTN.   She had an exercise myoview in 2006 that was normal.  Cardionet monitor showed SR with PACs and PVCs but no signif arrhythmia.  B Blocker Rx was givn as needed.  This was in 2008  She was recently seen by S Luking .  EKG was done  Echo was also done that showed LVEF was 75 to 80%.    Occasionally feels interruption in HB  Was constant then  Now occasional  No dizziness  Breathing OK  Has occasional R sided parasternal chest discomfort.  One time per year  Working out with trainer since September  Walking some   She is being followed for a lung nodule that has gotten bigger.  She is waiting for word from [facility] re work up recommendations  Lipids in January LDL was 137, HDL was 53.  This was on ZOcor.  Allergies  Allergen Reactions  . Codeine Nausea And Vomiting    Current Outpatient Prescriptions  Medication Sig Dispense Refill  . aspirin 81 MG tablet Take 1 tablet (81 mg total) by mouth daily.  1 tablet  0  . cholecalciferol (VITAMIN D) 1000 UNITS tablet Take 1,000 Units by mouth daily.      . Garlic 1000 MG CAPS Take 1 capsule by mouth daily.      Marland Kitchen levothyroxine (SYNTHROID, LEVOTHROID) 88 MCG tablet Take 88 mcg by mouth daily.      . Misc Natural Products (OSTEO BI-FLEX TRIPLE STRENGTH PO) Take 2 tablets by mouth daily.      . Multiple Vitamins-Minerals (CENTRUM SILVER) tablet Take 1 tablet by mouth daily.      . Omega-3 Fatty Acids (FISH OIL) 1200 MG CAPS Take 1 capsule by mouth 2 (two) times daily.      . pantoprazole (PROTONIX) 40 MG tablet Take 40 mg by mouth as needed.       . Probiotic Product (PROBIOTIC FORMULA PO) Take 1 capsule by mouth daily.      . promethazine (PHENERGAN) 12.5 MG tablet Take 12.5 mg by mouth every 8 (eight) hours as needed.       . simvastatin  (ZOCOR) 20 MG tablet Take 10 mg by mouth daily. Takes 1/2 a 20 mg tablet        Past Medical History  Diagnosis Date  . Esophageal reflux   . Hyperlipidemia   . Headache   . Obesity   . Costochondritis   . External hemorrhoids without mention of complication   . Diverticulitis   . Hypertension     pt denies  . Hypothyroidism   . Arthritis   . PONV (postoperative nausea and vomiting)     Past Surgical History  Procedure Date  . Esophagogastroduodenoscopy   . Appendectomy   . Abdominal hysterectomy   . Tonsillectomy   . Left oophorectomy   . Thyroid surgery     nodule and partial thyroidectomy  . Colonoscopy   . Rectal fissure repair   . Plantar fascia surgery   . Excision of skin tag 11/18/2011    Procedure: EXCISION OF SKIN TAG;  Surgeon: Romie Levee, MD;  Location: [facility];  Service: General;  Laterality: N/A;  posterior  . Hemorrhoid surgery 11/18/2011    Procedure: HEMORRHOIDECTOMY;  Surgeon: Romie Levee,  MD;  Location: [facility];  Service: General;;    Family History  Problem Relation Age of Onset  . Kidney cancer Father   . Breast cancer Maternal Grandmother   . Colon cancer Neg Hx   . Heart disease Maternal Grandfather     History   Social History  . Marital Status: Married    Spouse Name: N/A    Number of Children: 1  . Years of Education: N/A   Occupational History  . homemaker    Social History Main Topics  . Smoking status: Never Smoker   . Smokeless tobacco: Never Used  . Alcohol Use: Yes     Comment: wine  . Drug Use: No  . Sexually Active: Not on file   Other Topics Concern  . Not on file   Social History Narrative  . No narrative on file    Review of Systems:  All systems reviewed.  They are negative to the above problem except as previously stated.  Vital Signs: BP 108/80  Pulse 67  Ht 5\' 8"  (1.727 m)  Wt 254 lb (115.214 kg)  BMI 38.62 kg/m2  SpO2 97%  Physical Exam Patient is in  NAD HEENT:  Normocephalic, atraumatic. EOMI, PERRLA.  Neck: JVP is normal.  No bruits.  Lungs: clear to auscultation. No rales no wheezes.  Heart: Regular rate and rhythm. Normal S1, S2. No S3.   No significant murmurs. PMI not displaced.  Abdomen:  Supple, nontender. Normal bowel sounds. No masses. No hepatomegaly.  Extremities:   Good distal pulses throughout. No lower extremity edema.  Musculoskeletal :moving all extremities.  Neuro:   alert and oriented x3.  CN II-XII grossly intact.  EKG  SR 65 bpm.  Incomp RBBB.  Miniscule in III.  Q wave in AVF.  Compared to EKG from Dr. Fletcher Anon office, R wave is now present in V2, V3, higher in V4.  Overall no signif change from EKGs in 2006 Assessment and Plan:  Patient is a 56 yo who was referred for evaluation of abnormal EKG  I think this relates to the way her heart sits in her chest.  Echo shows LV function is hyperdynamic. She is relatively asymptomatic.  I encouraged her to stay active.   Lipids look good per her report She is working on wt loss.  I will be available as needed in the future.  2.  CP  Infrequent  Not cardiac  3.  HL  Lipids are not optimal  I saw this after patient left.  LDL was 137 in January  On Zocor 10  With recent [facility] recomm I would push to tighter contro.  Will defer to Dr. Gerda Diss.

## 2011-12-20 ENCOUNTER — Other Ambulatory Visit: Payer: Self-pay

## 2012-01-12 DIAGNOSIS — C801 Malignant (primary) neoplasm, unspecified: Secondary | ICD-10-CM

## 2012-01-12 HISTORY — DX: Malignant (primary) neoplasm, unspecified: C80.1

## 2012-01-18 ENCOUNTER — Encounter (INDEPENDENT_AMBULATORY_CARE_PROVIDER_SITE_OTHER): Payer: 59 | Admitting: General Surgery

## 2012-02-09 ENCOUNTER — Telehealth: Payer: Self-pay | Admitting: Internal Medicine

## 2012-02-09 NOTE — Telephone Encounter (Signed)
I have left the patient a message to call back to schedule an office visit per Dr. Fletcher Anon request

## 2012-02-10 NOTE — Telephone Encounter (Signed)
Patient is scheduled for office visit on Monday at 2:45

## 2012-02-14 ENCOUNTER — Encounter: Payer: Self-pay | Admitting: Internal Medicine

## 2012-02-14 ENCOUNTER — Other Ambulatory Visit (INDEPENDENT_AMBULATORY_CARE_PROVIDER_SITE_OTHER): Payer: 59

## 2012-02-14 ENCOUNTER — Ambulatory Visit (INDEPENDENT_AMBULATORY_CARE_PROVIDER_SITE_OTHER): Payer: 59 | Admitting: Internal Medicine

## 2012-02-14 VITALS — BP 110/70 | HR 64 | Ht 67.5 in | Wt 248.2 lb

## 2012-02-14 DIAGNOSIS — R768 Other specified abnormal immunological findings in serum: Secondary | ICD-10-CM

## 2012-02-14 DIAGNOSIS — R894 Abnormal immunological findings in specimens from other organs, systems and tissues: Secondary | ICD-10-CM

## 2012-02-14 DIAGNOSIS — R1013 Epigastric pain: Secondary | ICD-10-CM

## 2012-02-14 DIAGNOSIS — K648 Other hemorrhoids: Secondary | ICD-10-CM

## 2012-02-14 LAB — IGA: IgA: 91 mg/dL (ref 68–378)

## 2012-02-14 MED ORDER — HYDROCORTISONE ACETATE 25 MG RE SUPP: 25.0000 mg | RECTAL | Status: DC | PRN

## 2012-02-14 NOTE — Patient Instructions (Addendum)
Your physician has requested that you go to the basement for lab work before leaving today.  Start psyllium or metamucil capsules, over the counter.  We have sent the medication to your pharmacy for you to pick up at your convenience, Anusol [facility] suppositories.  Once we get the labs back we will be in touch with results and plans.  Thank you for choosing me and [facility].  Iva Boop, M.D., [facility]    Per patient call her at [phone] first when trying to reach her.

## 2012-02-14 NOTE — Progress Notes (Signed)
Subjective:    Patient ID: Debbie Miranda, female    DOB: June 22, 1955, 57 y.o.   MRN: [id number]  HPI The patient presents in followup. She has had recent resection of the left lower lobe because of a carcinoid tumor, this was performed at [facility] After that she had an increase in her epigastric pain she had diarrhea problems and some nausea and vomiting. This persisted for several weeks. She saw her primary care physician who worked her up with stool studies for C. differential, bacterial infection that were negative. She had a positive H. pylori antibody, she had a positive IgG gliadin peptide antibody as part of a celiac panel but her tissue transglutaminase antibody and the gliadin antibodies were negative.  He says this was similar to her epigastric pain problems she's had over the years. They come and go, she has had known reflux esophagitis with erosions and is improved on PPI.  He recently was given Levaquin for a sinus problem, and has held her PPI because the bottle says not to take antacids with that medication. She has been off PPI for about 10 days at this point not quite.  She was quite ill in January, and didn't feel well, she is much better at this point. Still has some abdominal soreness. Her basic metabolic panel was normal as well. She did not have fevers.  She also has had some intermittent rectal bleeding of late. She has a known history of hemorrhoids on previous colonoscopy. It does seem to be improved since the diarrhea, down. Mostly bright red blood on the tissue paper. She did have an external hemorrhoidectomy late last year, and removal a skin tag by Dr. Maisie Fus, and her anal itching is almost completely gone and she is quite pleased with that result.  Allergies  Allergen Reactions  . Codeine Nausea And Vomiting   Outpatient Prescriptions Prior to Visit  Medication Sig Dispense Refill  . aspirin 81 MG tablet Take 1 tablet (81 mg total) by mouth daily.  1 tablet   0  . cholecalciferol (VITAMIN D) 1000 UNITS tablet Take 1,000 Units by mouth daily.      . Garlic 1000 MG CAPS Take 1 capsule by mouth daily.      Marland Kitchen levothyroxine (SYNTHROID, LEVOTHROID) 88 MCG tablet Take 100 mcg by mouth daily.       . Misc Natural Products (OSTEO BI-FLEX TRIPLE STRENGTH PO) Take 2 tablets by mouth daily.      . Multiple Vitamins-Minerals (CENTRUM SILVER) tablet Take 1 tablet by mouth daily.      . Omega-3 Fatty Acids (FISH OIL) 1200 MG CAPS Take 1 capsule by mouth 2 (two) times daily.      . pantoprazole (PROTONIX) 40 MG tablet Take 40 mg by mouth as needed.       . Probiotic Product (PROBIOTIC FORMULA PO) Take 1 capsule by mouth daily.      . simvastatin (ZOCOR) 20 MG tablet Take 10 mg by mouth daily. Takes 1/2 a 20 mg tablet      .         Last reviewed on 02/14/2012  1:42 PM by Iva Boop, MD Past Medical History  Diagnosis Date  . Esophageal reflux   . Hyperlipidemia   . Headache   . Obesity   . Costochondritis   . External hemorrhoids without mention of complication   . Diverticulitis   . Hypertension     pt denies  . Hypothyroidism   .  Arthritis   . PONV (postoperative nausea and vomiting)   . Positive H. pylori test   . Carcinoid tumor of lung 12/2011   Past Surgical History  Procedure Date  . Esophagogastroduodenoscopy   . Appendectomy   . Abdominal hysterectomy   . Tonsillectomy   . Left oophorectomy   . Thyroid surgery     nodule and partial thyroidectomy  . Colonoscopy   . Rectal fissure repair   . Plantar fascia surgery   . Excision of skin tag 11/18/2011    Procedure: EXCISION OF SKIN TAG;  Surgeon: Romie Levee, MD;  Location: [facility];  Service: General;  Laterality: N/A;  posterior  . Hemorrhoid surgery 11/18/2011    Procedure: HEMORRHOIDECTOMY;  Surgeon: Romie Levee, MD;  Location: [facility];  Service: General;;  . Lung lobectomy 12/2011    [facility]      Review of Systems As in the history of present illness    Objective:   Physical Exam General:  NAD Eyes:   anicteric Lungs:  clear with diminished breath sounds in the left lower lobe, I can see thoracoscopic surgical scars Heart:  S1S2 no rubs, murmurs or gallops Abdomen:  soft and mildly tender epigastrium that is worse with abdominal wall tension, BS+ Ext:   no edema  Rectal:  female staff present - normal anoderm post hemorrhoid excision - no mass, brown stool, normal resting tone  Anoscopy: Internal hemorrhoids - mildly inflamed   Data Reviewed:  Stool studies and serology tests ordered by Dr. Gerda Diss in 01/2012 and  office notes from January.     Assessment & Plan:   1. Epigastric pain   2. Helicobacter pylori ab+   3. Hemorrhoids, internal, with bleeding    Question if she's had a functional disturbance again whether the H. pylori is causing problems. I know she's had erosive esophagitis in the past. I warned her the treatment of H. pylori could actually make reflux worse. However she truly has it it is appropriate eradicated this is a potential carcinogen.   1. Stool Ag H pylori to see she has infection or just immunity to previous infection. She will continue to hold pantoprazole for a total of 2 weeks before submitting this test. 2. IgA level to see if TTG antibody is reliable. I think that D. emanated antigliadin antibodies IgG are nonspecific is Dr.Luking has noted. 3. Hydrocortisone suppositories prn for bleeding internal hemorrhoid 4. Fiber supplement (capsules per her preference) to try to decrease irregularity of bowel habits  I appreciate the opportunity to care for this patient.  CC: Lilyan Punt, MD

## 2012-02-22 ENCOUNTER — Other Ambulatory Visit: Payer: Self-pay | Admitting: Internal Medicine

## 2012-02-22 ENCOUNTER — Other Ambulatory Visit: Payer: 59

## 2012-02-22 DIAGNOSIS — L290 Pruritus ani: Secondary | ICD-10-CM

## 2012-02-28 ENCOUNTER — Telehealth: Payer: Self-pay | Admitting: Internal Medicine

## 2012-02-28 DIAGNOSIS — A048 Other specified bacterial intestinal infections: Secondary | ICD-10-CM

## 2012-02-28 NOTE — Telephone Encounter (Signed)
Patient did drop off the stool studies last week, but according to [facility] labs they were not in the proper containers and they cancelled the orders.  I have spoken with our lab and they will investigate and call me back.  I did call the patient and explain that there was a problem with the specimens and she may need to come and get an additional container.  She verbalized understanding and is aware I will contact her with instructions once the lab has done their research on the matter.

## 2012-02-28 NOTE — Telephone Encounter (Signed)
She asked that I call on the home phone first then the cell.

## 2012-03-01 NOTE — Telephone Encounter (Signed)
Patient needs to submit another specimen for h pylori stool antigen and pinworm test. I spoke with Debbie Miranda in the lab and they will contact her once the specimen container comes from [employer]

## 2012-03-03 ENCOUNTER — Other Ambulatory Visit: Payer: Self-pay

## 2012-03-03 ENCOUNTER — Other Ambulatory Visit: Payer: 59

## 2012-03-03 DIAGNOSIS — L290 Pruritus ani: Secondary | ICD-10-CM

## 2012-03-03 DIAGNOSIS — A048 Other specified bacterial intestinal infections: Secondary | ICD-10-CM

## 2012-03-04 LAB — HELICOBACTER PYLORI  SPECIAL ANTIGEN: H. PYLORI Antigen: NEGATIVE

## 2012-03-06 NOTE — Progress Notes (Signed)
Quick Note:  Ok - would observe and follow-up as needed ______

## 2012-03-06 NOTE — Progress Notes (Signed)
Quick Note:  Tests are negative  No sign of H. Pylori or pinworms  How is she?  Status update please ______

## 2012-03-20 ENCOUNTER — Other Ambulatory Visit: Payer: Self-pay | Admitting: Dermatology

## 2012-04-06 ENCOUNTER — Other Ambulatory Visit: Payer: Self-pay | Admitting: Family Medicine

## 2012-04-06 LAB — LIPID PANEL
Cholesterol: 211 mg/dL — ABNORMAL HIGH (ref 0–200)
HDL: 55 mg/dL (ref >39)
Triglycerides: 84 mg/dL (ref <150)

## 2012-04-06 LAB — T4, FREE: Free T4: 1.61 ng/dL (ref 0.80–1.80)

## 2012-04-06 LAB — HEPATIC FUNCTION PANEL
ALT: 19 U/L (ref 0–35)
AST: 20 U/L (ref 0–37)
Albumin: 4.2 g/dL (ref 3.5–5.2)
Alkaline Phosphatase: 62 U/L (ref 39–117)
Indirect Bilirubin: 0.5 mg/dL (ref 0.0–0.9)
Total Protein: 6.3 g/dL (ref 6.0–8.3)

## 2012-04-07 ENCOUNTER — Encounter: Payer: Self-pay | Admitting: Family Medicine

## 2012-05-16 ENCOUNTER — Ambulatory Visit (INDEPENDENT_AMBULATORY_CARE_PROVIDER_SITE_OTHER): Payer: 59 | Admitting: Family Medicine

## 2012-05-16 ENCOUNTER — Encounter: Payer: Self-pay | Admitting: Family Medicine

## 2012-05-16 VITALS — BP 119/76 | HR 80 | Temp 98.3°F | Wt 248.8 lb

## 2012-05-16 DIAGNOSIS — E041 Nontoxic single thyroid nodule: Secondary | ICD-10-CM

## 2012-05-16 DIAGNOSIS — R7301 Impaired fasting glucose: Secondary | ICD-10-CM

## 2012-05-16 DIAGNOSIS — K219 Gastro-esophageal reflux disease without esophagitis: Secondary | ICD-10-CM | POA: Insufficient documentation

## 2012-05-16 DIAGNOSIS — E039 Hypothyroidism, unspecified: Secondary | ICD-10-CM

## 2012-05-16 DIAGNOSIS — D1432 Benign neoplasm of left bronchus and lung: Secondary | ICD-10-CM

## 2012-05-16 DIAGNOSIS — D3A090 Benign carcinoid tumor of the bronchus and lung: Secondary | ICD-10-CM | POA: Insufficient documentation

## 2012-05-16 HISTORY — DX: Gastro-esophageal reflux disease without esophagitis: K21.9

## 2012-05-16 HISTORY — DX: Nontoxic single thyroid nodule: E04.1

## 2012-05-16 HISTORY — DX: Impaired fasting glucose: R73.01

## 2012-05-16 MED ORDER — PREDNISONE 20 MG PO TABS: ORAL_TABLET | ORAL | Status: AC

## 2012-05-16 MED ORDER — CYCLOBENZAPRINE HCL 10 MG PO TABS: 10.0000 mg | ORAL_TABLET | Freq: Three times a day (TID) | ORAL | Status: DC | PRN

## 2012-05-16 NOTE — Progress Notes (Signed)
  Subjective:    Patient ID: Debbie Miranda, female    DOB: [birth year], 57 y.o.   MRN: [id number]  HPIpatient with significant lumbar pain. Radiates to some degree down the left leg. Does not go down the right leg. No known injury. She has had this intermittently before but usually gets better on its own. She has been using some Flexeril at nighttime it seemed to help a little bit. She denied any abdominal discomfort no vomiting or diarrhea denies any numbness in the legs. Past medical history-thyroid growth, lung growth issue.    Review of Systems No dysuria no abdominal pain denies numbness in the feet. No chest pain or shortness of breath. No fevers    Objective:   Physical Exam Lungs are clear hearts regular blood pressure is noted abdomen is soft lower back subjective tenderness positive straight leg raise on the left negative on the right       Assessment & Plan:  Low back pain with sciatica-stretching exercises were shown as well as prednisone taper, after prednisone is finished Aleve as necessary, should gradually get better over the next 6-8 weeks note MRI indicated currently. Followup later this year for standard regular checkup.

## 2012-05-16 NOTE — Patient Instructions (Addendum)
Dr.Kuzma, [place]. For Hydrographic surveyor.  Aleve 220 mg 1 to 2 twice daily as needed  Tramadol 50 1 every 6 hours as needed for pain

## 2012-05-17 ENCOUNTER — Encounter: Payer: Self-pay | Admitting: *Deleted

## 2012-05-29 ENCOUNTER — Other Ambulatory Visit: Payer: Self-pay

## 2012-05-29 DIAGNOSIS — Z1231 Encounter for screening mammogram for malignant neoplasm of breast: Secondary | ICD-10-CM

## 2012-07-05 ENCOUNTER — Ambulatory Visit
Admission: RE | Admit: 2012-07-05 | Discharge: 2012-07-05 | Disposition: A | Discharge status: Home / Self Care | Payer: 59 | Source: Ambulatory Visit

## 2012-07-05 DIAGNOSIS — Z1231 Encounter for screening mammogram for malignant neoplasm of breast: Secondary | ICD-10-CM

## 2012-08-16 DIAGNOSIS — L84 Corns and callosities: Secondary | ICD-10-CM | POA: Insufficient documentation

## 2012-08-16 HISTORY — DX: Corns and callosities: L84

## 2012-10-02 ENCOUNTER — Encounter: Payer: Self-pay | Admitting: Podiatrist

## 2012-10-02 DIAGNOSIS — L84 Corns and callosities: Secondary | ICD-10-CM

## 2012-10-02 DIAGNOSIS — B351 Tinea unguium: Secondary | ICD-10-CM | POA: Insufficient documentation

## 2012-10-12 ENCOUNTER — Encounter: Payer: Self-pay | Admitting: Podiatrist

## 2012-10-12 ENCOUNTER — Ambulatory Visit (INDEPENDENT_AMBULATORY_CARE_PROVIDER_SITE_OTHER): Payer: 59 | Admitting: Podiatrist

## 2012-10-12 VITALS — BP 126/75 | HR 70 | Resp 20

## 2012-10-12 DIAGNOSIS — B351 Tinea unguium: Secondary | ICD-10-CM

## 2012-10-12 NOTE — Progress Notes (Signed)
Subjective: Patient presents today for continued lasering of the right hallux nail. Patient has had 5 treatments thus far and has noted minimal improvement of the appearance of the toenail. Patient does tolerate the procedures well and is utilizing formula 3 topical at home. Objective: Debridement of nails accomplished via mechanical measures and lasering was carried out at today's visit without complication patient and physician were appropriate protective eye gear.  Assessment: Onychomycosis being treated with laser therapy right hallux nail  Plan: Lasering was carried out at today's visit without complication. I discussed with the patient trying Jublia and a prescription was written for her use. She will be seen back as needed for followup in the future if further laser treatments are required may consider oral medication to augment the treatments.

## 2012-12-13 ENCOUNTER — Telehealth: Payer: Self-pay | Admitting: Family Medicine

## 2012-12-13 DIAGNOSIS — R7301 Impaired fasting glucose: Secondary | ICD-10-CM

## 2012-12-13 DIAGNOSIS — E041 Nontoxic single thyroid nodule: Secondary | ICD-10-CM

## 2012-12-13 DIAGNOSIS — Z79899 Other long term (current) drug therapy: Secondary | ICD-10-CM

## 2012-12-13 DIAGNOSIS — M791 Myalgia, unspecified site: Secondary | ICD-10-CM

## 2012-12-13 DIAGNOSIS — E785 Hyperlipidemia, unspecified: Secondary | ICD-10-CM

## 2012-12-13 NOTE — Telephone Encounter (Signed)
bw for Jan 15th PE appt, please include the following : TSH, T3, T4, VLDL, CRP

## 2012-12-14 NOTE — Telephone Encounter (Signed)
In addition to this testing patient would need lipid, metabolic 7, liver. Also please check her previous chart if she has had any elevated glucoses add hemoglobin A1c. I do not have the chart thank you

## 2012-12-14 NOTE — Telephone Encounter (Signed)
Left message on voicemail notifying patient that blood work has been ordered and can report to lab for blood draw.

## 2012-12-18 LAB — LIPID PANEL
Cholesterol: 308 mg/dL — ABNORMAL HIGH (ref 0–200)
HDL: 65 mg/dL (ref >39)
Triglycerides: 86 mg/dL (ref <150)

## 2012-12-18 LAB — T3: T3, Total: 85.2 ng/dL (ref 80.0–204.0)

## 2012-12-18 LAB — C-REACTIVE PROTEIN: CRP: 0.5 mg/dL (ref <0.60)

## 2012-12-18 LAB — BASIC METABOLIC PANEL
BUN: 10 mg/dL (ref 6–23)
CO2: 29 mEq/L (ref 19–32)
Calcium: 9.4 mg/dL (ref 8.4–10.5)
Chloride: 100 mEq/L (ref 96–112)
Creat: 0.73 mg/dL (ref 0.50–1.10)

## 2012-12-18 LAB — HEMOGLOBIN A1C
Hgb A1c MFr Bld: 5.7 % — ABNORMAL HIGH (ref <5.7)
Mean Plasma Glucose: 117 mg/dL — ABNORMAL HIGH (ref <117)

## 2012-12-18 LAB — HEPATIC FUNCTION PANEL
ALT: 17 U/L (ref 0–35)
Albumin: 4.3 g/dL (ref 3.5–5.2)
Total Protein: 6.5 g/dL (ref 6.0–8.3)

## 2012-12-18 LAB — TSH: TSH: 2.697 u[IU]/mL (ref 0.350–4.500)

## 2012-12-19 MED ORDER — ROSUVASTATIN CALCIUM 10 MG PO TABS: 10.0000 mg | ORAL_TABLET | Freq: Every day | ORAL | Status: DC

## 2012-12-19 NOTE — Telephone Encounter (Signed)
I sent the request already on a previous nursing message but here it is again. Crestor 10 mg, #30, 4 refills, one daily we will recheck lab work again after her next visit

## 2012-12-19 NOTE — Addendum Note (Signed)
Addended byOneal Deputy D on: 12/19/2012 01:21 PM   Modules accepted: Orders

## 2012-12-19 NOTE — Telephone Encounter (Signed)
Start Crestor 10 mg, 1qd , #30,6 refills

## 2012-12-20 ENCOUNTER — Other Ambulatory Visit: Payer: Self-pay | Admitting: Dermatology

## 2013-01-01 ENCOUNTER — Ambulatory Visit (INDEPENDENT_AMBULATORY_CARE_PROVIDER_SITE_OTHER): Payer: 59 | Admitting: Family Medicine

## 2013-01-01 ENCOUNTER — Encounter: Payer: Self-pay | Admitting: Family Medicine

## 2013-01-01 VITALS — BP 112/80 | Temp 98.7°F | Ht 68.0 in | Wt 247.0 lb

## 2013-01-01 DIAGNOSIS — J019 Acute sinusitis, unspecified: Secondary | ICD-10-CM

## 2013-01-01 MED ORDER — LEVOFLOXACIN 500 MG PO TABS: 500.0000 mg | ORAL_TABLET | Freq: Every day | ORAL | Status: AC

## 2013-01-01 MED ORDER — CEFTRIAXONE SODIUM 1 G IJ SOLR
1.0000 g | Freq: Once | INTRAMUSCULAR | Status: AC
  Administered 2013-01-01: 1 g via INTRAMUSCULAR

## 2013-01-01 MED ORDER — FLUTICASONE PROPIONATE 50 MCG/ACT NA SUSP: 2.0000 | Freq: Every day | NASAL | Status: DC

## 2013-01-01 NOTE — Progress Notes (Signed)
   Subjective:    Patient ID: Debbie Miranda, female    DOB: 06-11-1955, 57 y.o.   MRN: [id number]  Sinusitis This is a new problem. The current episode started 1 to 4 weeks ago. The problem has been gradually worsening since onset. The maximum temperature recorded prior to her arrival was 100 - 100.9 F. Her pain is at a severity of 8/10. The pain is moderate. Associated symptoms include congestion, coughing, ear pain, headaches, sinus pressure and a sore throat. Pertinent negatives include no shortness of breath. Past treatments include oral decongestants. The treatment provided no relief.   No vomiting 3 weeks of URI now worse with congestion PMH benign   Review of Systems  Constitutional: Negative for fever and activity change.  HENT: Positive for congestion, ear pain, rhinorrhea, sinus pressure and sore throat.   Eyes: Negative for discharge.  Respiratory: Positive for cough. Negative for shortness of breath and wheezing.   Cardiovascular: Negative for chest pain.  Neurological: Positive for headaches.       Objective:   Physical Exam  Nursing note and vitals reviewed. Constitutional: She appears well-developed.  HENT:  Head: Normocephalic.  Nose: Nose normal.  Mouth/Throat: Oropharynx is clear and moist. No oropharyngeal exudate.  Neck: Neck supple.  Cardiovascular: Normal rate and normal heart sounds.   No murmur heard. Pulmonary/Chest: Effort normal and breath sounds normal. She has no wheezes.  Lymphadenopathy:    She has no cervical adenopathy.  Skin: Skin is warm and dry.          Assessment & Plan:  Acute sinusitis I feel this is secondary to a virus antibiotics prescribed warning signs discussed

## 2013-01-15 ENCOUNTER — Other Ambulatory Visit: Payer: Self-pay | Admitting: Family Medicine

## 2013-01-25 ENCOUNTER — Encounter: Payer: Self-pay | Admitting: Family Medicine

## 2013-01-25 ENCOUNTER — Ambulatory Visit (INDEPENDENT_AMBULATORY_CARE_PROVIDER_SITE_OTHER): Payer: 59 | Admitting: Family Medicine

## 2013-01-25 VITALS — BP 124/86 | Ht 68.0 in | Wt 248.0 lb

## 2013-01-25 DIAGNOSIS — Z79899 Other long term (current) drug therapy: Secondary | ICD-10-CM

## 2013-01-25 DIAGNOSIS — R32 Unspecified urinary incontinence: Secondary | ICD-10-CM

## 2013-01-25 DIAGNOSIS — I890 Lymphedema, not elsewhere classified: Secondary | ICD-10-CM

## 2013-01-25 DIAGNOSIS — E785 Hyperlipidemia, unspecified: Secondary | ICD-10-CM

## 2013-01-25 LAB — POCT URINALYSIS DIPSTICK
Spec Grav, UA: 1.02
pH, UA: 6

## 2013-01-25 MED ORDER — ROSUVASTATIN CALCIUM 10 MG PO TABS: 10.0000 mg | ORAL_TABLET | Freq: Every day | ORAL | Status: DC

## 2013-01-25 MED ORDER — LEVOTHYROXINE SODIUM 100 MCG PO TABS: ORAL_TABLET | ORAL | Status: DC

## 2013-01-25 NOTE — Progress Notes (Signed)
   Subjective:    Patient ID: Debbie Miranda, female    DOB: 03-24-1955, 58 y.o.   MRN: [id number]  HPI Patient is here today for a wellness exam.  Patient c/o sinus infection. Some congestion, mucous drainage noted, no fever, no N, some wheeze yesterday,still staying active  Bladder leakage seems to be getting worst.for years some stress incomtinence, some leakage before going, urinating 2 to 3 times at night, no dysuria, no hematuria,    Would like to know when last colonoscopy was. , Immunizations- shingles?      Review of Systems  Constitutional: Negative for activity change, appetite change and fatigue.  HENT: Negative for congestion, ear discharge and rhinorrhea.   Eyes: Negative for discharge.  Respiratory: Negative for cough, chest tightness and wheezing.   Cardiovascular: Negative for chest pain.  Gastrointestinal: Negative for vomiting and abdominal pain.  Genitourinary: Negative for frequency and difficulty urinating.  Musculoskeletal: Negative for neck pain.  Allergic/Immunologic: Negative for environmental allergies and food allergies.  Neurological: Negative for weakness and headaches.  Psychiatric/Behavioral: Negative for behavioral problems and agitation.       Objective:   Physical Exam  Constitutional: She is oriented to person, place, and time. She appears well-developed and well-nourished.  HENT:  Head: Normocephalic.  Right Ear: External ear normal.  Left Ear: External ear normal.  Eyes: Pupils are equal, round, and reactive to light.  Neck: Normal range of motion. No thyromegaly present.  Cardiovascular: Normal rate, regular rhythm, normal heart sounds and intact distal pulses.   No murmur heard. Pulmonary/Chest: Effort normal and breath sounds normal. No respiratory distress. She has no wheezes.  Abdominal: Soft. Bowel sounds are normal. She exhibits no distension and no mass. There is no tenderness.  Musculoskeletal: Normal range of motion. She  exhibits no edema and no tenderness.  Lymphadenopathy:    She has no cervical adenopathy.  Neurological: She is alert and oriented to person, place, and time. She exhibits normal muscle tone.  Skin: Skin is warm and dry.  Psychiatric: She has a normal mood and affect. Her behavior is normal.          Assessment & Plan:  #1 sinusitis-antibiotics may be necessary but I believe that this is resolving from her earlier treatment. I would give it a few more weeks if progressive symptoms she will call us and we will call in medication Flonase and Claritin may be helpful she may try this. #2 she gets her female health checkup through her gynecologist #3 hyperlipidemia Crestor 10 mg daily recheck lab work again in about 6 weeks' time #4 mild arthritis in the knees she is using over-the-counter preparations to help with this #5 she is trying to work on her weight to bring it down #6 needs colonoscopy in 2 years, I do recommend shingles vaccine she will hold off on this currently to see if we get coverage of that in our office. #7 she does relate intermittent bladder incontinence slight leakage with frequency at nighttime. She does not want to go on medications currently but will call us back if ongoing troubles her urinalysis looks normal #8 she does relate an occasional swollen gland under her neck that comes and goes we will check a CBC I do not feel any masses under the neck currently

## 2013-05-31 ENCOUNTER — Encounter (HOSPITAL_BASED_OUTPATIENT_CLINIC_OR_DEPARTMENT_OTHER): Payer: Self-pay | Admitting: *Deleted

## 2013-05-31 NOTE — Progress Notes (Signed)
No labs needed

## 2013-06-06 ENCOUNTER — Other Ambulatory Visit: Payer: Self-pay | Admitting: Orthopedic Surgery

## 2013-06-07 ENCOUNTER — Encounter (HOSPITAL_BASED_OUTPATIENT_CLINIC_OR_DEPARTMENT_OTHER)
Admission: RE | Disposition: A | Discharge status: Home / Self Care | Payer: Self-pay | Source: Ambulatory Visit | Attending: Orthopedic Surgery

## 2013-06-07 ENCOUNTER — Encounter (HOSPITAL_BASED_OUTPATIENT_CLINIC_OR_DEPARTMENT_OTHER): Payer: 59 | Admitting: Certified Registered"

## 2013-06-07 ENCOUNTER — Ambulatory Visit (HOSPITAL_BASED_OUTPATIENT_CLINIC_OR_DEPARTMENT_OTHER): Payer: 59 | Admitting: Certified Registered"

## 2013-06-07 ENCOUNTER — Encounter (HOSPITAL_BASED_OUTPATIENT_CLINIC_OR_DEPARTMENT_OTHER): Payer: Self-pay | Admitting: Certified Registered"

## 2013-06-07 ENCOUNTER — Ambulatory Visit (HOSPITAL_BASED_OUTPATIENT_CLINIC_OR_DEPARTMENT_OTHER)
Admission: RE | Admit: 2013-06-07 | Discharge: 2013-06-07 | Disposition: A | Discharge status: Home / Self Care | Payer: 59 | Source: Ambulatory Visit | Attending: Orthopedic Surgery | Admitting: Orthopedic Surgery

## 2013-06-07 DIAGNOSIS — Z7982 Long term (current) use of aspirin: Secondary | ICD-10-CM | POA: Insufficient documentation

## 2013-06-07 DIAGNOSIS — Z79899 Other long term (current) drug therapy: Secondary | ICD-10-CM | POA: Insufficient documentation

## 2013-06-07 DIAGNOSIS — I10 Essential (primary) hypertension: Secondary | ICD-10-CM | POA: Insufficient documentation

## 2013-06-07 DIAGNOSIS — M6240 Contracture of muscle, unspecified site: Secondary | ICD-10-CM | POA: Insufficient documentation

## 2013-06-07 DIAGNOSIS — M7661 Achilles tendinitis, right leg: Secondary | ICD-10-CM

## 2013-06-07 DIAGNOSIS — K219 Gastro-esophageal reflux disease without esophagitis: Secondary | ICD-10-CM | POA: Insufficient documentation

## 2013-06-07 DIAGNOSIS — E039 Hypothyroidism, unspecified: Secondary | ICD-10-CM | POA: Insufficient documentation

## 2013-06-07 DIAGNOSIS — E785 Hyperlipidemia, unspecified: Secondary | ICD-10-CM | POA: Insufficient documentation

## 2013-06-07 DIAGNOSIS — M7730 Calcaneal spur, unspecified foot: Secondary | ICD-10-CM | POA: Insufficient documentation

## 2013-06-07 DIAGNOSIS — M7660 Achilles tendinitis, unspecified leg: Secondary | ICD-10-CM | POA: Insufficient documentation

## 2013-06-07 HISTORY — DX: Encounter for fitting and adjustment of spectacles and contact lenses: Z46.0

## 2013-06-07 HISTORY — PX: EXCISION HAGLUND'S DEFORMITY WITH ACHILLES TENDON REPAIR: SHX5627

## 2013-06-07 HISTORY — PX: GASTROCNEMIUS RECESSION: SHX863

## 2013-06-07 SURGERY — RECESSION, MUSCLE, GASTROCNEMIUS
Anesthesia: Regional | Site: Leg Lower | Laterality: Right

## 2013-06-07 MED ORDER — SODIUM CHLORIDE 0.9 % IV SOLN: INTRAVENOUS | Status: DC

## 2013-06-07 MED ORDER — PROMETHAZINE HCL 25 MG/ML IJ SOLN
6.2500 mg | Freq: Once | INTRAMUSCULAR | Status: AC
  Administered 2013-06-07: 6.25 mg via INTRAVENOUS

## 2013-06-07 MED ORDER — FENTANYL CITRATE 0.05 MG/ML IJ SOLN
INTRAMUSCULAR | Status: AC
  Filled 2013-06-07: qty 2

## 2013-06-07 MED ORDER — SUCCINYLCHOLINE CHLORIDE 20 MG/ML IJ SOLN
INTRAMUSCULAR | Status: DC | PRN
  Administered 2013-06-07: 100 mg via INTRAVENOUS

## 2013-06-07 MED ORDER — BACITRACIN ZINC 500 UNIT/GM EX OINT
TOPICAL_OINTMENT | CUTANEOUS | Status: DC | PRN
  Administered 2013-06-07: 1 via TOPICAL

## 2013-06-07 MED ORDER — LIDOCAINE HCL (CARDIAC) 20 MG/ML IV SOLN
INTRAVENOUS | Status: DC | PRN
  Administered 2013-06-07: 30 mg via INTRAVENOUS

## 2013-06-07 MED ORDER — OXYCODONE HCL 5 MG/5ML PO SOLN: 5.0000 mg | Freq: Once | ORAL | Status: DC | PRN

## 2013-06-07 MED ORDER — BACITRACIN ZINC 500 UNIT/GM EX OINT
TOPICAL_OINTMENT | CUTANEOUS | Status: AC
  Filled 2013-06-07: qty 28.35

## 2013-06-07 MED ORDER — FENTANYL CITRATE 0.05 MG/ML IJ SOLN
INTRAMUSCULAR | Status: DC | PRN
  Administered 2013-06-07: 50 ug via INTRAVENOUS

## 2013-06-07 MED ORDER — SCOPOLAMINE 1 MG/3DAYS TD PT72
1.0000 | MEDICATED_PATCH | TRANSDERMAL | Status: DC
  Administered 2013-06-07: 1.5 mg via TRANSDERMAL

## 2013-06-07 MED ORDER — MIDAZOLAM HCL 5 MG/5ML IJ SOLN
INTRAMUSCULAR | Status: DC | PRN
  Administered 2013-06-07: 2 mg via INTRAVENOUS

## 2013-06-07 MED ORDER — SCOPOLAMINE 1 MG/3DAYS TD PT72
MEDICATED_PATCH | TRANSDERMAL | Status: AC
  Filled 2013-06-07: qty 1

## 2013-06-07 MED ORDER — CHLORHEXIDINE GLUCONATE 4 % EX LIQD: 60.0000 mL | Freq: Once | CUTANEOUS | Status: DC

## 2013-06-07 MED ORDER — PROPOFOL 10 MG/ML IV EMUL
INTRAVENOUS | Status: AC
  Filled 2013-06-07: qty 50

## 2013-06-07 MED ORDER — LACTATED RINGERS IV SOLN
INTRAVENOUS | Status: DC
  Administered 2013-06-07: 10 mL/h via INTRAVENOUS
  Administered 2013-06-07 (×2): via INTRAVENOUS

## 2013-06-07 MED ORDER — BUPIVACAINE-EPINEPHRINE (PF) 0.5% -1:200000 IJ SOLN
INTRAMUSCULAR | Status: DC | PRN
  Administered 2013-06-07: 30 mL

## 2013-06-07 MED ORDER — FENTANYL CITRATE 0.05 MG/ML IJ SOLN
50.0000 ug | INTRAMUSCULAR | Status: DC | PRN
  Administered 2013-06-07: 100 ug via INTRAVENOUS

## 2013-06-07 MED ORDER — PROMETHAZINE HCL 25 MG/ML IJ SOLN
INTRAMUSCULAR | Status: AC
  Filled 2013-06-07: qty 1

## 2013-06-07 MED ORDER — ASPIRIN EC 325 MG PO TBEC: 325.0000 mg | DELAYED_RELEASE_TABLET | Freq: Every day | ORAL | Status: DC

## 2013-06-07 MED ORDER — PROMETHAZINE HCL 12.5 MG PO TABS: 12.5000 mg | ORAL_TABLET | Freq: Four times a day (QID) | ORAL | Status: DC | PRN

## 2013-06-07 MED ORDER — DEXAMETHASONE SODIUM PHOSPHATE 4 MG/ML IJ SOLN
INTRAMUSCULAR | Status: DC | PRN
  Administered 2013-06-07: 10 mg via INTRAVENOUS

## 2013-06-07 MED ORDER — MIDAZOLAM HCL 2 MG/2ML IJ SOLN
INTRAMUSCULAR | Status: AC
  Filled 2013-06-07: qty 2

## 2013-06-07 MED ORDER — 0.9 % SODIUM CHLORIDE (POUR BTL) OPTIME
TOPICAL | Status: DC | PRN
  Administered 2013-06-07: 300 mL

## 2013-06-07 MED ORDER — HYDROMORPHONE HCL PF 1 MG/ML IJ SOLN: 0.2500 mg | INTRAMUSCULAR | Status: DC | PRN

## 2013-06-07 MED ORDER — ACETAMINOPHEN 500 MG PO TABS
ORAL_TABLET | ORAL | Status: AC
  Filled 2013-06-07: qty 2

## 2013-06-07 MED ORDER — CEFAZOLIN SODIUM-DEXTROSE 2-3 GM-% IV SOLR
2.0000 g | INTRAVENOUS | Status: AC
  Administered 2013-06-07: 2 g via INTRAVENOUS

## 2013-06-07 MED ORDER — PROPOFOL 10 MG/ML IV BOLUS
INTRAVENOUS | Status: DC | PRN
  Administered 2013-06-07: 150 mg via INTRAVENOUS

## 2013-06-07 MED ORDER — ACETAMINOPHEN 500 MG PO TABS
1000.0000 mg | ORAL_TABLET | Freq: Once | ORAL | Status: AC
  Administered 2013-06-07: 1000 mg via ORAL

## 2013-06-07 MED ORDER — BUPIVACAINE-EPINEPHRINE (PF) 0.5% -1:200000 IJ SOLN
INTRAMUSCULAR | Status: AC
  Filled 2013-06-07: qty 30

## 2013-06-07 MED ORDER — HYDROCODONE-ACETAMINOPHEN 5-325 MG PO TABS: 1.0000 | ORAL_TABLET | Freq: Four times a day (QID) | ORAL | Status: DC | PRN

## 2013-06-07 MED ORDER — SUCCINYLCHOLINE CHLORIDE 20 MG/ML IJ SOLN
INTRAMUSCULAR | Status: AC
  Filled 2013-06-07: qty 1

## 2013-06-07 MED ORDER — ONDANSETRON HCL 4 MG/2ML IJ SOLN
INTRAMUSCULAR | Status: DC | PRN
  Administered 2013-06-07: 4 mg via INTRAVENOUS

## 2013-06-07 MED ORDER — MIDAZOLAM HCL 2 MG/2ML IJ SOLN
1.0000 mg | INTRAMUSCULAR | Status: DC | PRN
  Administered 2013-06-07: 2 mg via INTRAVENOUS

## 2013-06-07 MED ORDER — CEFAZOLIN SODIUM-DEXTROSE 2-3 GM-% IV SOLR
INTRAVENOUS | Status: AC
  Filled 2013-06-07: qty 50

## 2013-06-07 MED ORDER — OXYCODONE HCL 5 MG PO TABS: 5.0000 mg | ORAL_TABLET | Freq: Once | ORAL | Status: DC | PRN

## 2013-06-07 MED ORDER — FENTANYL CITRATE 0.05 MG/ML IJ SOLN
INTRAMUSCULAR | Status: AC
  Filled 2013-06-07: qty 6

## 2013-06-07 MED ORDER — BUPIVACAINE HCL (PF) 0.5 % IJ SOLN
INTRAMUSCULAR | Status: DC | PRN
  Administered 2013-06-07: 15 mL

## 2013-06-07 SURGICAL SUPPLY — 82 items
BANDAGE ESMARK 6X9 LF (GAUZE/BANDAGES/DRESSINGS) ×3 IMPLANT
BLADE 15 SAFETY STRL DISP (BLADE) ×12 IMPLANT
BLADE AVERAGE 25X9 (BLADE) IMPLANT
BLADE MICRO SAGITTAL (BLADE) ×4 IMPLANT
BNDG CMPR 9X6 STRL LF SNTH (GAUZE/BANDAGES/DRESSINGS) ×3
BNDG COHESIVE 4X5 TAN STRL (GAUZE/BANDAGES/DRESSINGS) ×4 IMPLANT
BNDG COHESIVE 6X5 TAN STRL LF (GAUZE/BANDAGES/DRESSINGS) ×4 IMPLANT
BNDG ESMARK 6X9 LF (GAUZE/BANDAGES/DRESSINGS) ×4
BOOT STEPPER DURA LG (SOFTGOODS) IMPLANT
BOOT STEPPER DURA MED (SOFTGOODS) IMPLANT
CANISTER SUCT 1200ML W/VALVE (MISCELLANEOUS) IMPLANT
CHLORAPREP W/TINT 26ML (MISCELLANEOUS) ×4 IMPLANT
COVER TABLE BACK 60X90 (DRAPES) ×4 IMPLANT
CUFF TOURNIQUET SINGLE 34IN LL (TOURNIQUET CUFF) ×4 IMPLANT
DRAPE EXTREMITY T 121X128X90 (DRAPE) ×4 IMPLANT
DRAPE OEC MINIVIEW 54X84 (DRAPES) IMPLANT
DRAPE U-SHAPE 47X51 STRL (DRAPES) ×4 IMPLANT
DRSG EMULSION OIL 3X3 NADH (GAUZE/BANDAGES/DRESSINGS) ×8 IMPLANT
DRSG PAD ABDOMINAL 8X10 ST (GAUZE/BANDAGES/DRESSINGS) ×8 IMPLANT
ELECT REM PT RETURN 9FT ADLT (ELECTROSURGICAL) ×4
ELECTRODE REM PT RTRN 9FT ADLT (ELECTROSURGICAL) ×3 IMPLANT
GAUZE SPONGE 4X4 12PLY STRL (GAUZE/BANDAGES/DRESSINGS) ×4 IMPLANT
GLOVE BIO SURGEON STRL SZ8 (GLOVE) ×4 IMPLANT
GLOVE BIOGEL PI IND STRL 7.0 (GLOVE) ×3 IMPLANT
GLOVE BIOGEL PI IND STRL 8 (GLOVE) ×3 IMPLANT
GLOVE BIOGEL PI INDICATOR 7.0 (GLOVE) ×1
GLOVE BIOGEL PI INDICATOR 8 (GLOVE) ×1
GLOVE ECLIPSE 6.5 STRL STRAW (GLOVE) ×4 IMPLANT
GLOVE EXAM NITRILE MD LF STRL (GLOVE) ×4 IMPLANT
GOWN STRL REUS W/ TWL LRG LVL3 (GOWN DISPOSABLE) ×3 IMPLANT
GOWN STRL REUS W/ TWL XL LVL3 (GOWN DISPOSABLE) ×3 IMPLANT
GOWN STRL REUS W/TWL LRG LVL3 (GOWN DISPOSABLE) ×4
GOWN STRL REUS W/TWL XL LVL3 (GOWN DISPOSABLE) ×3
KIT BIO-TENODESIS 3X8 DISP (MISCELLANEOUS)
KIT INSRT BABSR STRL DISP BTN (MISCELLANEOUS) IMPLANT
NDL SAFETY ECLIPSE 18X1.5 (NEEDLE) IMPLANT
NEEDLE HYPO 18GX1.5 SHARP (NEEDLE)
NEEDLE HYPO 22GX1.5 SAFETY (NEEDLE) IMPLANT
NEEDLE HYPO 25X1 1.5 SAFETY (NEEDLE) IMPLANT
NS IRRIG 1000ML POUR BTL (IV SOLUTION) ×4 IMPLANT
PACK ACHILLES SUTUREBRIDGE (Anchor) ×4 IMPLANT
PACK BASIN DAY SURGERY FS (CUSTOM PROCEDURE TRAY) ×4 IMPLANT
PAD CAST 4YDX4 CTTN HI CHSV (CAST SUPPLIES) ×3 IMPLANT
PADDING CAST ABS 4INX4YD NS (CAST SUPPLIES) ×1
PADDING CAST ABS COTTON 4X4 ST (CAST SUPPLIES) ×3 IMPLANT
PADDING CAST COTTON 4X4 STRL (CAST SUPPLIES) ×3
PADDING CAST COTTON 6X4 STRL (CAST SUPPLIES) ×4 IMPLANT
PASSER SUT SWANSON 36MM LOOP (INSTRUMENTS) IMPLANT
PENCIL BUTTON HOLSTER BLD 10FT (ELECTRODE) ×4 IMPLANT
RETRIEVER SUT HEWSON (MISCELLANEOUS) IMPLANT
SANITIZER HAND PURELL 535ML FO (MISCELLANEOUS) ×4 IMPLANT
SHEET MEDIUM DRAPE 40X70 STRL (DRAPES) ×4 IMPLANT
SLEEVE SCD COMPRESS KNEE MED (MISCELLANEOUS) ×4 IMPLANT
SPLINT FAST PLASTER 5X30 (CAST SUPPLIES) ×20
SPLINT PLASTER CAST FAST 5X30 (CAST SUPPLIES) ×60 IMPLANT
SPONGE LAP 18X18 X RAY DECT (DISPOSABLE) ×4 IMPLANT
STAPLER VISISTAT 35W (STAPLE) IMPLANT
STOCKINETTE 6  STRL (DRAPES) ×1
STOCKINETTE 6 STRL (DRAPES) ×3 IMPLANT
SUCTION FRAZIER TIP 10 FR DISP (SUCTIONS) IMPLANT
SUT ETHIBOND 0 MO6 C/R (SUTURE) IMPLANT
SUT ETHIBOND 2 OS 4 DA (SUTURE) IMPLANT
SUT ETHIBOND 3-0 V-5 (SUTURE) IMPLANT
SUT ETHILON 3 0 PS 1 (SUTURE) ×4 IMPLANT
SUT FIBERWIRE #2 38 T-5 BLUE (SUTURE)
SUT MNCRL AB 3-0 PS2 18 (SUTURE) ×4 IMPLANT
SUT MNCRL AB 4-0 PS2 18 (SUTURE) IMPLANT
SUT VIC AB 0 CT1 27 (SUTURE)
SUT VIC AB 0 CT1 27XBRD ANBCTR (SUTURE) IMPLANT
SUT VIC AB 0 SH 27 (SUTURE) ×4 IMPLANT
SUT VIC AB 2-0 SH 18 (SUTURE) IMPLANT
SUT VIC AB 2-0 SH 27 (SUTURE)
SUT VIC AB 2-0 SH 27XBRD (SUTURE) IMPLANT
SUT VICRYL 4-0 PS2 18IN ABS (SUTURE) IMPLANT
SUTURE FIBERWR #2 38 T-5 BLUE (SUTURE) IMPLANT
SYR BULB 3OZ (MISCELLANEOUS) ×4 IMPLANT
SYR CONTROL 10ML LL (SYRINGE) IMPLANT
TOWEL OR 17X24 6PK STRL BLUE (TOWEL DISPOSABLE) ×4 IMPLANT
TOWEL OR NON WOVEN STRL DISP B (DISPOSABLE) IMPLANT
TUBE CONNECTING 20X1/4 (TUBING) IMPLANT
UNDERPAD 30X30 INCONTINENT (UNDERPADS AND DIAPERS) ×4 IMPLANT
YANKAUER SUCT BULB TIP NO VENT (SUCTIONS) IMPLANT

## 2013-06-07 NOTE — Progress Notes (Signed)
Assisted Dr. Ola Spurr with right, ultrasound guided, popliteal/saphenous block. Side rails up, monitors on throughout procedure. See vital signs in flow sheet. Tolerated Procedure well.

## 2013-06-07 NOTE — Anesthesia Postprocedure Evaluation (Signed)
  Anesthesia Post-op Note  Patient: Debbie Miranda  Procedure(s) Performed: Procedure(s): GASTROCNEMIUS RECESSION (Right) RIGHT ACHILLES DEBRIDEMENT; HAGLUND EXCISION (Right)  Patient Location: PACU  Anesthesia Type:General and block  Level of Consciousness: awake and alert   Airway and Oxygen Therapy: Patient Spontanous Breathing  Post-op Pain: none  Post-op Assessment: Post-op Vital signs reviewed, Patient's Cardiovascular Status Stable and Respiratory Function Stable  Post-op Vital Signs: Reviewed  Filed Vitals:   06/07/13 0945  BP: 121/50  Pulse: 73  Temp:   Resp: 16    Complications: No apparent anesthesia complications

## 2013-06-07 NOTE — H&P (Addendum)
Debbie Miranda is an 58 y.o. female.   Chief Complaint: right heel pain HPI: 58 y/o female with long h/o right heel pain from achilles tendonitis.  SHe also has a haglund deformity and a tight heelcord.  She presents now for operative treatment.  Past Medical History  Diagnosis Date  . Esophageal reflux   . Hyperlipidemia   . Headache(784.0)   . Obesity   . Costochondritis   . External hemorrhoids without mention of complication   . Diverticulitis   . Hypertension     pt denies  . Hypothyroidism   . Arthritis   . PONV (postoperative nausea and vomiting)   . Positive H. pylori test   . 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 IFG (impaired fasting glucose)   . 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 Mycotic toenails 12/10/2010    1st toenail right-lasered nail 12/10/2010-present (last on 06/15/2012)  . Contact lens/glasses fitting     wares contacts or glasses    Past Surgical History  Procedure Laterality Date  . Esophagogastroduodenoscopy    . Appendectomy    . Abdominal hysterectomy    . Tonsillectomy    . Left oophorectomy    . Thyroid surgery      nodule and partial thyroidectomy  . Colonoscopy    . Rectal fissure repair    . Plantar fascia surgery    . Excision of skin tag  11/18/2011    Procedure: EXCISION OF SKIN TAG;  Surgeon: Leighton Ruff, MD;  Location: [place];  Service: General;  Laterality: N/A;  posterior  . Hemorrhoid surgery  11/18/2011    Procedure: HEMORRHOIDECTOMY;  Surgeon: Leighton Ruff, MD;  Location: [facility];  Service: General;;  . Lung lobectomy  12/2011    [place] removed for carcinoid    Family History  Problem Relation Age of Onset  . Kidney cancer Father   . Breast cancer Maternal Grandmother   . Colon cancer Neg Hx   . Heart disease Maternal Grandfather    Social History:  reports that she has never smoked. She has never used smokeless tobacco. She reports that she drinks alcohol. She  reports that she does not use illicit drugs.  Allergies:  Allergies  Allergen Reactions  . Codeine Nausea And Vomiting    Medications Prior to Admission  Medication Sig Dispense Refill  . aspirin 81 MG tablet Take 1 tablet (81 mg total) by mouth daily.  1 tablet  0  . BIOTIN 5000 PO Take 1 capsule by mouth.      . cholecalciferol (VITAMIN D) 1000 UNITS tablet Take 1,000 Units by mouth daily.      Marland Kitchen co-enzyme Q-10 50 MG capsule Take 50 mg by mouth daily.      . Flaxseed, Linseed, (FLAX SEED OIL PO) Take by mouth.      . levothyroxine (SYNTHROID, LEVOTHROID) 100 MCG tablet TAKE ONE (1) TABLET EACH DAY  90 tablet  3  . Multiple Vitamins-Minerals (CENTRUM SILVER) tablet Take 1 tablet by mouth daily.      . Probiotic Product (PROBIOTIC FORMULA PO) Take 1 capsule by mouth daily.      . rosuvastatin (CRESTOR) 10 MG tablet Take 1 tablet (10 mg total) by mouth daily.  90 tablet  3  . traMADol (ULTRAM) 50 MG tablet Take 50 mg by mouth as needed for pain.        No results found for this  or any previous visit (from the past 48 hour(s)). No results found.  ROS  No recent f/c/n/v/wt loss  Blood pressure 132/77, pulse 74, temperature 98.5 F (36.9 C), temperature source Oral, resp. rate 16, height 5\' 8"  (1.727 m), weight 119.863 kg (264 lb 4 oz), SpO2 97.00%. Physical Exam  wn wd woman in nad.  A and O x 4.  Mood and affect normal.  EOMI.  Resp unlabored.  Right heel with healthy and intact skin.  No lymphadenopathy.  5/5 strength in PF and DF of the ankle.  Sens to LT intact at the foot.  Assessment/Plan R achilles tendonitis, haglund deformity and tight heelcord - to OR for gastroc recession, Haglund excision and achilles debridement and reconstruction.  The risks and benefits of the alternative treatment options have been discussed in detail.  The patient wishes to proceed with surgery and specifically understands risks of bleeding, infection, nerve damage, blood clots, need for additional  surgery, amputation and death.   Wylene Simmer 2013-07-05, 7:17 AM

## 2013-06-07 NOTE — Anesthesia Procedure Notes (Addendum)
Anesthesia Regional Block:  Popliteal block  Pre-Anesthetic Checklist: ,, timeout performed, Correct Patient, Correct Site, Correct Laterality, Correct Procedure, Correct Position, site marked, Risks and benefits discussed, pre-op evaluation, post-op pain management  Laterality: Right  Prep: Maximum Sterile Barrier Precautions used and chloraprep       Needles:  Injection technique: Single-shot  Needle Type: Echogenic Stimulator Needle     Needle Length: 9cm 9 cm Needle Gauge: 21 and 21 G    Additional Needles:  Procedures: ultrasound guided (picture in chart) and nerve stimulator Popliteal block  Nerve Stimulator or Paresthesia:  Response: Peroneal,  Response: Tibial,   Additional Responses:   Narrative:  Start time: 06/07/2013 7:13 AM End time: 06/07/2013 7:24 AM Injection made incrementally with aspirations every 5 mL. Anesthesiologist: Ola Spurr, MD  Additional Notes: 2% Lidocaine skin wheel. Saphenous block with 10cc of 0.5% Bupivicaine plain.   Procedure Name: Intubation Date/Time: 06/07/2013 7:37 AM Performed by: Carel Carrier Pre-anesthesia Checklist: Patient identified, Emergency Drugs available, Suction available and Patient being monitored Patient Re-evaluated:Patient Re-evaluated prior to inductionOxygen Delivery Method: Circle System Utilized Preoxygenation: Pre-oxygenation with 100% oxygen Intubation Type: IV induction Ventilation: Mask ventilation without difficulty Laryngoscope Size: Mac and 3 Grade View: Grade I Tube type: Oral Tube size: 7.0 mm Number of attempts: 1 Airway Equipment and Method: stylet and LTA kit utilized Placement Confirmation: ETT inserted through vocal cords under direct vision,  positive ETCO2 and breath sounds checked- equal and bilateral Secured at: 21 cm Tube secured with: Tape Dental Injury: Teeth and Oropharynx as per pre-operative assessment

## 2013-06-07 NOTE — Brief Op Note (Signed)
06/07/2013  8:52 AM  PATIENT:  Debbie Miranda  58 y.o. female  PRE-OPERATIVE DIAGNOSIS:  RIGHT ACHILLES TENDONITIS TIGHT HEEL CORD AND HAGLUND DEFORMITY  POST-OPERATIVE DIAGNOSIS:  RIGHT ACHILLES TENDONITIS TIGHT HEEL CORD AND HAGLUND DEFORMITY  Procedure(s): 1.  Right GASTROCNEMIUS RECESSION 2.  RIGHT ACHILLES tendon DEBRIDEMENT and reconstruction 3.  Right HAGLUND deformity EXCISION  SURGEON:  Wylene Simmer, MD  ASSISTANT: n/a  ANESTHESIA:   General, regional  EBL:  minimal   TOURNIQUET:   Total Tourniquet Time Documented: Thigh (Right) - 54 minutes Total: Thigh (Right) - 54 minutes   COMPLICATIONS:  None apparent  DISPOSITION:  Extubated, awake and stable to recovery.  DICTATION ID:  [id number]

## 2013-06-07 NOTE — Anesthesia Preprocedure Evaluation (Addendum)
Anesthesia Evaluation  Patient identified by MRN, date of birth, ID band Patient awake    Reviewed: Allergy & Precautions, H&P , NPO status , Patient's Chart, lab work & pertinent test results  History of Anesthesia Complications (+) PONV and history of anesthetic complications  Airway Mallampati: II TM Distance: >3 FB Neck ROM: Full    Dental no notable dental hx. (+) Teeth Intact, Dental Advisory Given   Pulmonary neg pulmonary ROS,  History of lobectomy for tumor breath sounds clear to auscultation  Pulmonary exam normal       Cardiovascular hypertension, Rhythm:Regular Rate:Normal     Neuro/Psych  Headaches, negative neurological ROS  negative psych ROS   GI/Hepatic Neg liver ROS, GERD-  Medicated and Controlled,  Endo/Other  Hypothyroidism   Renal/GU negative Renal ROS  negative genitourinary   Musculoskeletal   Abdominal   Peds  Hematology negative hematology ROS (+)   Anesthesia Other Findings   Reproductive/Obstetrics negative OB ROS                         Anesthesia Physical Anesthesia Plan  ASA: II  Anesthesia Plan: General and Regional   Post-op Pain Management:    Induction: Intravenous  Airway Management Planned: Oral ETT  Additional Equipment:   Intra-op Plan:   Post-operative Plan: Extubation in OR  Informed Consent: I have reviewed the patients History and Physical, chart, labs and discussed the procedure including the risks, benefits and alternatives for the proposed anesthesia with the patient or authorized representative who has indicated his/her understanding and acceptance.   Dental advisory given  Plan Discussed with: CRNA  Anesthesia Plan Comments:         Anesthesia Quick Evaluation

## 2013-06-07 NOTE — Discharge Instructions (Signed)
Debbie Simmer, MD [place]  Please read the following information regarding your care after surgery.  Medications  You only need a prescription for the narcotic pain medicine (ex. oxycodone, Percocet, Norco).  All of the other medicines listed below are available over the counter. Norco as prescribed for pain. You may also take ibuprofen 800 mg every 8 hours as needed for pain.  Narcotic pain medicine (ex. oxycodone, Percocet, Vicodin) will cause constipation.  To prevent this problem, take the following medicines while you are taking any pain medicine. X docusate sodium (Colace) 100 mg twice a day X senna (Senokot) 2 tablets twice a day  X To help prevent blood clots, take an aspirin (325 mg) once a day for a month after surgery.  You should also get up every hour while you are awake to move around.    Weight Bearing ? Bear weight when you are able on your operated leg or foot. ? Bear weight only on the heel of your operated foot in the post-op shoe. X Do not bear any weight on the operated leg or foot.  Cast / Splint / Dressing X Keep your splint or cast clean and dry.  Dont put anything (coat hanger, pencil, etc) down inside of it.  If it gets damp, use a hair dryer on the cool setting to dry it.  If it gets soaked, call the office to schedule an appointment for a cast change. ? Remove your dressing 3 days after surgery and cover the incisions with dry dressings.    After your dressing, cast or splint is removed; you may shower, but do not soak or scrub the wound.  Allow the water to run over it, and then gently pat it dry.  Swelling It is normal for you to have swelling where you had surgery.  To reduce swelling and pain, keep your toes above your nose for at least 3 days after surgery.  It may be necessary to keep your foot or leg elevated for several weeks.  If it hurts, it should be elevated.  Follow Up Call my office at [phone] when you are discharged from the  hospital or surgery center to schedule an appointment to be seen two weeks after surgery.  Call my office at [phone] if you develop a fever >101.5 F, nausea, vomiting, bleeding from the surgical site or severe pain.    Regional Anesthesia Blocks  1. Numbness or the inability to move the "blocked" extremity may last from 3-48 hours after placement. The length of time depends on the medication injected and your individual response to the medication. If the numbness is not going away after 48 hours, call your surgeon.  2. The extremity that is blocked will need to be protected until the numbness is gone and the  Strength has returned. Because you cannot feel it, you will need to take extra care to avoid injury. Because it may be weak, you may have difficulty moving it or using it. You may not know what position it is in without looking at it while the block is in effect.  3. For blocks in the legs and feet, returning to weight bearing and walking needs to be done carefully. You will need to wait until the numbness is entirely gone and the strength has returned. You should be able to move your leg and foot normally before you try and bear weight or walk. You will need someone to be with you when you first try to  ensure you do not fall and possibly risk injury.  4. Bruising and tenderness at the needle site are common side effects and will resolve in a few days.  5. Persistent numbness or new problems with movement should be communicated to the surgeon or the [facility] [phone] [place] [phone]).    Post Anesthesia Home Care Instructions  Activity: Get plenty of rest for the remainder of the day. A responsible adult should stay with you for 24 hours following the procedure.  For the next 24 hours, DO NOT: -Drive a car -Paediatric nurse -Drink alcoholic beverages -Take any medication unless instructed by your physician -Make any legal decisions  or sign important papers.  Meals: Start with liquid foods such as gelatin or soup. Progress to regular foods as tolerated. Avoid greasy, spicy, heavy foods. If nausea and/or vomiting occur, drink only clear liquids until the nausea and/or vomiting subsides. Call your physician if vomiting continues.  Special Instructions/Symptoms: Your throat may feel dry or sore from the anesthesia or the breathing tube placed in your throat during surgery. If this causes discomfort, gargle with warm salt water. The discomfort should disappear within 24 hours.

## 2013-06-07 NOTE — Transfer of Care (Signed)
Immediate Anesthesia Transfer of Care Note  Patient: AOLANIS CRISPEN  Procedure(s) Performed: Procedure(s): GASTROCNEMIUS RECESSION (Right) RIGHT ACHILLES DEBRIDEMENT; HAGLUND EXCISION (Right)  Patient Location: PACU  Anesthesia Type:GA combined with regional for post-op pain  Level of Consciousness: awake, alert , oriented and patient cooperative  Airway & Oxygen Therapy: Patient Spontanous Breathing and Patient connected to face mask oxygen  Post-op Assessment: Report given to PACU RN and Post -op Vital signs reviewed and stable  Post vital signs: Reviewed and stable  Complications: No apparent anesthesia complications

## 2013-06-08 ENCOUNTER — Encounter (HOSPITAL_BASED_OUTPATIENT_CLINIC_OR_DEPARTMENT_OTHER): Payer: Self-pay | Admitting: Orthopedic Surgery

## 2013-06-08 NOTE — Op Note (Signed)
Debbie Miranda, Debbie Miranda                ACCOUNT NO.:  [id number]  MEDICAL RECORD NO.:  [id number]  LOCATION:                                 FACILITY:  PHYSICIAN:  Wylene Simmer, MD             DATE OF BIRTH:  DATE OF PROCEDURE:  06/07/2013 DATE OF DISCHARGE:                              OPERATIVE REPORT   PREOPERATIVE DIAGNOSES: 1. Right Achilles insertional tendinopathy. 2. Tight right heel cord. 3. Right Haglund deformity.  POSTOPERATIVE DIAGNOSES: 1. Right Achilles insertional tendinopathy. 2. Tight right heel cord. 3. Right Haglund deformity.  PROCEDURE: 1. Right gastrocnemius recession. 2. Right Achilles tendon debridement and reconstruction. 3. Excision of right Haglund deformity.  SURGEON:  Wylene Simmer, MD  ANESTHESIA:  General, regional.  ESTIMATED BLOOD LOSS:  Minimal.  TOURNIQUET TIME:  54 minutes at 250 mmHg.  COMPLICATIONS:  None apparent.  DISPOSITION:  Extubated, awake and stable to recovery.  INDICATIONS FOR PROCEDURE:  The patient is a 58 year old woman with a long history of insertional Achilles tendinopathy.  She has a prominent Haglund deformity as well as a tight right heel cord.  She presents now for operative treatment for this painful conditions having failed nonoperative treatment.  She understands specifically the risks of bleeding, infection, nerve damage, blood clots, need for additional surgery, continued pain, amputation, and death.  PROCEDURE IN DETAIL:  After preoperative consent was obtained, the correct operative site was identified.  The patient was brought to the operating room supine on a stretcher.  General anesthesia was induced. Preoperative antibiotics were administered.  Surgical time-out was taken.  The right lower extremity was exsanguinated and a thigh tourniquet was inflated to 250 mmHg.  The patient was then turned into the prone position on the operating table.  Midline longitudinal incision was made at the calf.  Sharp  dissection was carried down through skin and subcutaneous tissue taking care to protect the sural nerve and lesser saphenous vein.  The superficial fascia was incised. The gastrocnemius tendon was identified.  The tendon was transected medial and lateral under direct vision.  The plantaris tendon was also divided under direct vision.  Wound was irrigated and closed with Monocryl and nylon.  Attention was then turned to the posterior aspect of the heel where a longitudinal incision was made in the midline.  Sharp dissection was carried down through the skin, subcutaneous tissue, and paratenon.  The paratenon was elevated off of the Achilles tendon.  The Achilles was then split longitudinally in its midline.  It was released from its insertion on the calcaneus.  There was a large Haglund deformity noted as well as significant insertional enthesophytes.  The tendon itself was debrided of all diseased tissue.  This left more than 50% of the tendon intact.  An oscillating saw was then used to resect the insertional enthesophytes and Haglund deformity in its entirety.  The cut surface of bone was smoothed with a rasp.  The wound was irrigated copiously.  The Arthrex suture bridge construct was then used to repair the tendon back down to bone with 4 anchors in an hourglass pattern of suture.  The wound was  again irrigated.  The paratenon was repaired with inverted simple sutures of 3-0 Monocryl.  The longitudinal split tear and the tendon had been repaired with horizontal mattress suture of 0 Vicryl. The skin incision was closed with a running 3-0 nylon.  Sterile dressings were applied followed by a well-padded short-leg splint. Tourniquet was released at 54 minutes after application of the dressings.  The patient was awakened from anesthesia and transported to the recovery room in stable condition.  FOLLOWUP PLAN:  The patient will be nonweightbearing on the right lower extremity.  She will  follow up with me in 3 weeks for suture removal and conversion to a CAM walker boot with a heel lift.  She will take aspirin for DVT prophylaxis.     Wylene Simmer, MD     JH/MEDQ  D:  06/07/2013  T:  06/08/2013  Job:  [id number]

## 2013-07-01 ENCOUNTER — Other Ambulatory Visit: Payer: Self-pay | Admitting: Internal Medicine

## 2013-07-02 ENCOUNTER — Telehealth: Payer: Self-pay | Admitting: Family Medicine

## 2013-07-02 NOTE — Telephone Encounter (Signed)
error 

## 2013-07-10 ENCOUNTER — Encounter: Payer: Self-pay | Admitting: Family Medicine

## 2013-07-10 ENCOUNTER — Ambulatory Visit (INDEPENDENT_AMBULATORY_CARE_PROVIDER_SITE_OTHER): Payer: 59 | Admitting: Family Medicine

## 2013-07-10 VITALS — BP 122/80 | Ht 68.0 in | Wt 244.0 lb

## 2013-07-10 DIAGNOSIS — E785 Hyperlipidemia, unspecified: Secondary | ICD-10-CM | POA: Insufficient documentation

## 2013-07-10 DIAGNOSIS — E039 Hypothyroidism, unspecified: Secondary | ICD-10-CM

## 2013-07-10 DIAGNOSIS — R7301 Impaired fasting glucose: Secondary | ICD-10-CM

## 2013-07-10 DIAGNOSIS — E559 Vitamin D deficiency, unspecified: Secondary | ICD-10-CM

## 2013-07-10 DIAGNOSIS — R109 Unspecified abdominal pain: Secondary | ICD-10-CM

## 2013-07-10 NOTE — Progress Notes (Signed)
   Subjective:    Patient ID: Debbie Miranda, female    DOB: 12/10/1955, 58 y.o.   MRN: [id number]  HPI Patient was supposed to be here today for a 6 month check up. She did not get her blood work done, but now she said she will.  She would like to focus today's visit on her abdominal pain. It has resolved, but she would still like to discuss it with you.   She had violent vomiting a week and a half later after surgery. She realized it could have been from the NSAIDS, so she wuit taking them and the abdominal pain went away as well.pain went on for several days She is trying to watch her diet try to stay physically active unable to lose weight. She had recent surgery on leg and therefore it is reduced her physical activity. No black or tarry stool  Review of Systems    she denies cough wheezing vomiting diarrhea she relates epigastric pain discomfort. Objective:   Physical Exam Neck no masses lungs are clear no crackles heart is regular. Pulses normal blood pressure good abdomen soft mild epigastric tenderness no guarding or rebound extremities no edema.       Assessment & Plan:  PPI for 6 weeks If her epigastric tenderness is not gone when she follows up in 4-6 weeks then may need scope may need scan certainly if patient gets worse she ought to followup or call us immediately. We will go ahead and do lab work await the results of this.  History hyperglycemia check A1c watch starch is in diet  History hyperlipidemia continue Crestor may need adjusting a medicine check lab work  History a carcinoid cancer I find no evidence of reoccurrence at this point

## 2013-07-11 ENCOUNTER — Other Ambulatory Visit: Payer: Self-pay

## 2013-07-11 DIAGNOSIS — Z1231 Encounter for screening mammogram for malignant neoplasm of breast: Secondary | ICD-10-CM

## 2013-07-16 LAB — HEPATIC FUNCTION PANEL
ALT: 16 U/L (ref 0–35)
AST: 15 U/L (ref 0–37)
Albumin: 4.1 g/dL (ref 3.5–5.2)
Alkaline Phosphatase: 61 U/L (ref 39–117)
BILIRUBIN INDIRECT: 0.2 mg/dL (ref 0.2–1.2)
Bilirubin, Direct: 0.1 mg/dL (ref 0.0–0.3)
TOTAL PROTEIN: 6 g/dL (ref 6.0–8.3)
Total Bilirubin: 0.3 mg/dL (ref 0.2–1.2)

## 2013-07-16 LAB — LIPID PANEL
CHOL/HDL RATIO: 3.1 ratio
Cholesterol: 154 mg/dL (ref 0–200)
HDL: 50 mg/dL (ref >39)
LDL Cholesterol: 86 mg/dL (ref 0–99)
Triglycerides: 88 mg/dL (ref <150)
VLDL: 18 mg/dL (ref 0–40)

## 2013-07-16 LAB — CBC WITH DIFFERENTIAL/PLATELET
Basophils Absolute: 0 10*3/uL (ref 0.0–0.1)
Basophils Relative: 0 % (ref 0–1)
EOS ABS: 0.2 10*3/uL (ref 0.0–0.7)
Eosinophils Relative: 3 % (ref 0–5)
HCT: 41.7 % (ref 36.0–46.0)
HEMOGLOBIN: 14 g/dL (ref 12.0–15.0)
LYMPHS ABS: 1.9 10*3/uL (ref 0.7–4.0)
Lymphocytes Relative: 31 % (ref 12–46)
MCH: 31 pg (ref 26.0–34.0)
MCHC: 33.6 g/dL (ref 30.0–36.0)
MCV: 92.5 fL (ref 78.0–100.0)
Monocytes Absolute: 0.4 10*3/uL (ref 0.1–1.0)
Monocytes Relative: 7 % (ref 3–12)
NEUTROS PCT: 59 % (ref 43–77)
Neutro Abs: 3.6 10*3/uL (ref 1.7–7.7)
Platelets: 200 10*3/uL (ref 150–400)
RBC: 4.51 MIL/uL (ref 3.87–5.11)
RDW: 14.2 % (ref 11.5–15.5)
WBC: 6.1 10*3/uL (ref 4.0–10.5)

## 2013-07-16 LAB — HEMOGLOBIN A1C
Hgb A1c MFr Bld: 5.9 % — ABNORMAL HIGH (ref <5.7)
MEAN PLASMA GLUCOSE: 123 mg/dL — AB (ref <117)

## 2013-07-16 LAB — AMYLASE: AMYLASE: 26 U/L (ref 0–105)

## 2013-07-16 LAB — LIPASE: LIPASE: 32 U/L (ref 0–75)

## 2013-07-17 LAB — TSH: TSH: 1.417 u[IU]/mL (ref 0.350–4.500)

## 2013-07-17 LAB — VITAMIN D 25 HYDROXY (VIT D DEFICIENCY, FRACTURES): VIT D 25 HYDROXY: 52 ng/mL (ref 30–89)

## 2013-07-25 ENCOUNTER — Other Ambulatory Visit: Payer: Self-pay | Admitting: Family Medicine

## 2013-08-16 ENCOUNTER — Encounter (INDEPENDENT_AMBULATORY_CARE_PROVIDER_SITE_OTHER): Payer: Self-pay

## 2013-08-16 ENCOUNTER — Ambulatory Visit
Admission: RE | Admit: 2013-08-16 | Discharge: 2013-08-16 | Disposition: A | Discharge status: Home / Self Care | Payer: 59 | Source: Ambulatory Visit

## 2013-08-16 DIAGNOSIS — Z1231 Encounter for screening mammogram for malignant neoplasm of breast: Secondary | ICD-10-CM

## 2013-08-21 ENCOUNTER — Encounter: Payer: Self-pay | Admitting: Family Medicine

## 2013-08-21 ENCOUNTER — Ambulatory Visit (INDEPENDENT_AMBULATORY_CARE_PROVIDER_SITE_OTHER): Payer: 59 | Admitting: Family Medicine

## 2013-08-21 VITALS — BP 130/76 | Ht 68.0 in | Wt 259.0 lb

## 2013-08-21 DIAGNOSIS — K2100 Gastro-esophageal reflux disease with esophagitis, without bleeding: Secondary | ICD-10-CM

## 2013-08-21 DIAGNOSIS — E039 Hypothyroidism, unspecified: Secondary | ICD-10-CM

## 2013-08-21 DIAGNOSIS — E785 Hyperlipidemia, unspecified: Secondary | ICD-10-CM

## 2013-08-21 MED ORDER — PANTOPRAZOLE SODIUM 40 MG PO TBEC: DELAYED_RELEASE_TABLET | ORAL | Status: DC

## 2013-08-21 NOTE — Progress Notes (Signed)
   Subjective:    Patient ID: Debbie Miranda, female    DOB: 10/03/1955, 58 y.o.   MRN: [id number]  Abdominal Pain The current episode started more than 1 month ago. The onset quality is gradual. The problem has been gradually improving. The pain is at a severity of 0/10. The patient is experiencing no pain. Nothing aggravates the pain. Treatments tried: Protonix. The treatment provided significant relief.  This is a follow up visit. Patient states she is having a flare up of costochondritis. She would like to discuss this today.     Review of Systems  Gastrointestinal: Positive for abdominal pain.       Objective:   Physical Exam        Assessment & Plan:  #1 abdominal pain-this is resolved she is no longer having any tenderness she will complete 3 full months of PPI. Then at that point, do not feel that she will need ongoing.  #2 hyperlipidemia she is doing well on the Crestor she is does state she has some body aches but she does not feel it's related to the medicine. She feels that for the most part she is doing a good job with eating properly and taking her medicine we'll recheck this again at her wellness in January, she states that her last cholesterol where the LDL was very high she was taking the medicine but she does relate that she stopped it when a personal trainer told her the medicine was bad for her. I believe that's the reason LDL was high but the patient feels that she was on medicine at that time. I have no medical explanation for that.  #3 carcinoid tumor-she will see [facility] in December have a CAT scan at that time  #4 intermittent sharp chest pains probable musculoskeletal I don't feel this is a sign of reoccurrence of tumor  #5 hypothyroidism level stable Followup by January 2016

## 2013-10-08 ENCOUNTER — Ambulatory Visit (INDEPENDENT_AMBULATORY_CARE_PROVIDER_SITE_OTHER): Payer: 59 | Admitting: Family Medicine

## 2013-10-08 ENCOUNTER — Encounter: Payer: Self-pay | Admitting: Family Medicine

## 2013-10-08 VITALS — BP 126/82 | Ht 68.0 in | Wt 264.0 lb

## 2013-10-08 DIAGNOSIS — K5732 Diverticulitis of large intestine without perforation or abscess without bleeding: Secondary | ICD-10-CM

## 2013-10-08 DIAGNOSIS — K5792 Diverticulitis of intestine, part unspecified, without perforation or abscess without bleeding: Secondary | ICD-10-CM

## 2013-10-08 MED ORDER — CIPROFLOXACIN HCL 500 MG PO TABS: 500.0000 mg | ORAL_TABLET | Freq: Two times a day (BID) | ORAL | Status: DC

## 2013-10-08 MED ORDER — METRONIDAZOLE 500 MG PO TABS: 500.0000 mg | ORAL_TABLET | Freq: Three times a day (TID) | ORAL | Status: DC

## 2013-10-08 NOTE — Progress Notes (Signed)
   Subjective:    Patient ID: Debbie Miranda, female    DOB: [birth year], 58 y.o.   MRN: [id number]  Abdominal Pain This is a new problem. The current episode started yesterday. The problem has been gradually worsening. The pain is at a severity of 9/10. The quality of the pain is aching and sharp. The abdominal pain does not radiate. Nothing aggravates the pain. She has tried acetaminophen for the symptoms. The treatment provided no relief.    Has had prominent diverticulitis in the past  Review of Systems  Gastrointestinal: Positive for abdominal pain.   relates pain discomfort denies vomiting diarrhea rectal bleeding     Objective:   Physical Exam  Lungs clear heart regular left lower quadrant abdominal tenderness no guarding or rebound      Assessment & Plan:  Patient has history of diverticulitis. She is tender in the left lower quadrant no guarding or rebound antibiotics prescribed she states she does not need pain medicine she was taught to length if she has high fevers vomiting progressive pain worse she will need lab work and CAT scan.

## 2013-12-20 ENCOUNTER — Ambulatory Visit (INDEPENDENT_AMBULATORY_CARE_PROVIDER_SITE_OTHER): Payer: 59 | Admitting: Family Medicine

## 2013-12-20 ENCOUNTER — Encounter: Payer: Self-pay | Admitting: Family Medicine

## 2013-12-20 VITALS — BP 126/78 | Temp 99.1°F | Ht 68.0 in | Wt 272.0 lb

## 2013-12-20 DIAGNOSIS — J218 Acute bronchiolitis due to other specified organisms: Secondary | ICD-10-CM

## 2013-12-20 DIAGNOSIS — J069 Acute upper respiratory infection, unspecified: Secondary | ICD-10-CM

## 2013-12-20 DIAGNOSIS — B9789 Other viral agents as the cause of diseases classified elsewhere: Secondary | ICD-10-CM

## 2013-12-20 MED ORDER — ALBUTEROL SULFATE HFA 108 (90 BASE) MCG/ACT IN AERS: 2.0000 | INHALATION_SPRAY | Freq: Four times a day (QID) | RESPIRATORY_TRACT | Status: DC | PRN

## 2013-12-20 MED ORDER — BENZONATATE 100 MG PO CAPS: 100.0000 mg | ORAL_CAPSULE | Freq: Four times a day (QID) | ORAL | Status: DC | PRN

## 2013-12-20 MED ORDER — LEVOFLOXACIN 500 MG PO TABS: 500.0000 mg | ORAL_TABLET | Freq: Every day | ORAL | Status: DC

## 2013-12-20 NOTE — Patient Instructions (Signed)
How to Use an Inhaler Proper inhaler technique is very important. Good technique ensures that the medicine reaches the lungs. Poor technique results in depositing the medicine on the tongue and back of the throat rather than in the airways. If you do not use the inhaler with good technique, the medicine will not help you. STEPS TO FOLLOW IF USING AN INHALER WITHOUT AN EXTENSION TUBE 1. Remove the cap from the inhaler. 2. If you are using the inhaler for the first time, you will need to prime it. Shake the inhaler for 5 seconds and release four puffs into the air, away from your face. Ask your health care provider or pharmacist if you have questions about priming your inhaler. 3. Shake the inhaler for 5 seconds before each breath in (inhalation). 4. Position the inhaler so that the top of the canister faces up. 5. Put your index finger on the top of the medicine canister. Your thumb supports the bottom of the inhaler. 6. Open your mouth. 7. Either place the inhaler between your teeth and place your lips tightly around the mouthpiece, or hold the inhaler 1-2 inches away from your open mouth. If you are unsure of which technique to use, ask your health care provider. 8. Breathe out (exhale) normally and as completely as possible. 9. Press the canister down with your index finger to release the medicine. 10. At the same time as the canister is pressed, inhale deeply and slowly until your lungs are completely filled. This should take 4-6 seconds. Keep your tongue down. 11. Hold the medicine in your lungs for 5-10 seconds (10 seconds is best). This helps the medicine get into the small airways of your lungs. 12. Breathe out slowly, through pursed lips. Whistling is an example of pursed lips. 13. Wait at least 15-30 seconds between puffs. Continue with the above steps until you have taken the number of puffs your health care provider has ordered. Do not use the inhaler more than your health care provider  tells you. 14. Replace the cap on the inhaler. 15. Follow the directions from your health care provider or the inhaler insert for cleaning the inhaler. STEPS TO FOLLOW IF USING AN INHALER WITH AN EXTENSION (SPACER) 1. Remove the cap from the inhaler. 2. If you are using the inhaler for the first time, you will need to prime it. Shake the inhaler for 5 seconds and release four puffs into the air, away from your face. Ask your health care provider or pharmacist if you have questions about priming your inhaler. 3. Shake the inhaler for 5 seconds before each breath in (inhalation). 4. Place the open end of the spacer onto the mouthpiece of the inhaler. 5. Position the inhaler so that the top of the canister faces up and the spacer mouthpiece faces you. 6. Put your index finger on the top of the medicine canister. Your thumb supports the bottom of the inhaler and the spacer. 7. Breathe out (exhale) normally and as completely as possible. 8. Immediately after exhaling, place the spacer between your teeth and into your mouth. Close your lips tightly around the spacer. 9. Press the canister down with your index finger to release the medicine. 10. At the same time as the canister is pressed, inhale deeply and slowly until your lungs are completely filled. This should take 4-6 seconds. Keep your tongue down and out of the way. 11. Hold the medicine in your lungs for 5-10 seconds (10 seconds is best). This helps the   medicine get into the small airways of your lungs. Exhale. 12. Repeat inhaling deeply through the spacer mouthpiece. Again hold that breath for up to 10 seconds (10 seconds is best). Exhale slowly. If it is difficult to take this second deep breath through the spacer, breathe normally several times through the spacer. Remove the spacer from your mouth. 13. Wait at least 15-30 seconds between puffs. Continue with the above steps until you have taken the number of puffs your health care provider has  ordered. Do not use the inhaler more than your health care provider tells you. 14. Remove the spacer from the inhaler, and place the cap on the inhaler. 15. Follow the directions from your health care provider or the inhaler insert for cleaning the inhaler and spacer. If you are using different kinds of inhalers, use your quick relief medicine to open the airways 10-15 minutes before using a steroid if instructed to do so by your health care provider. If you are unsure which inhalers to use and the order of using them, ask your health care provider, nurse, or respiratory therapist. If you are using a steroid inhaler, always rinse your mouth with water after your last puff, then gargle and spit out the water. Do not swallow the water. AVOID:  Inhaling before or after starting the spray of medicine. It takes practice to coordinate your breathing with triggering the spray.  Inhaling through the nose (rather than the mouth) when triggering the spray. HOW TO DETERMINE IF YOUR INHALER IS FULL OR NEARLY EMPTY You cannot know when an inhaler is empty by shaking it. A few inhalers are now being made with dose counters. Ask your health care provider for a prescription that has a dose counter if you feel you need that extra help. If your inhaler does not have a counter, ask your health care provider to help you determine the date you need to refill your inhaler. Write the refill date on a calendar or your inhaler canister. Refill your inhaler 7-10 days before it runs out. Be sure to keep an adequate supply of medicine. This includes making sure it is not expired, and that you have a spare inhaler.  SEEK MEDICAL CARE IF:   Your symptoms are only partially relieved with your inhaler.  You are having trouble using your inhaler.  You have some increase in phlegm. SEEK IMMEDIATE MEDICAL CARE IF:   You feel little or no relief with your inhalers. You are still wheezing and are feeling shortness of breath or  tightness in your chest or both.  You have dizziness, headaches, or a fast heart rate.  You have chills, fever, or night sweats.  You have a noticeable increase in phlegm production, or there is blood in the phlegm. MAKE SURE YOU:   Understand these instructions.  Will watch your condition.  Will get help right away if you are not doing well or get worse. Document Released: 12/26/1999 Document Revised: 10/18/2012 Document Reviewed: 07/27/2012 Exit[employer] Patient Information 2015 [employer]. This information is not intended to replace advice given to you by your health care provider. Make sure you discuss any questions you have with your health care provider.  

## 2013-12-20 NOTE — Progress Notes (Signed)
   Subjective:    Patient ID: Debbie Miranda, female    DOB: 19-Sep-1955, 58 y.o.   MRN: [id number]  Cough This is a new problem. The current episode started in the past 7 days. Associated symptoms include rhinorrhea and wheezing. Pertinent negatives include no chest pain, ear pain, fever or shortness of breath. Associated symptoms comments: Sinus drainage, congestion. Treatments tried: advil cold and sinus, humidifier,   started monday Post nasal drip No sinus ache Has sinus pressure No swaets or chill No n or v No DOE PMH benign  Review of Systems  Constitutional: Negative for fever and activity change.  HENT: Positive for congestion and rhinorrhea. Negative for ear pain.   Eyes: Negative for discharge.  Respiratory: Positive for cough and wheezing. Negative for shortness of breath.   Cardiovascular: Negative for chest pain.       Objective:   Physical Exam  Constitutional: She appears well-developed.  HENT:  Head: Normocephalic.  Nose: Nose normal.  Mouth/Throat: Oropharynx is clear and moist. No oropharyngeal exudate.  Neck: Neck supple.  Cardiovascular: Normal rate and normal heart sounds.   No murmur heard. Pulmonary/Chest: Effort normal. She has wheezes.  Lymphadenopathy:    She has no cervical adenopathy.  Skin: Skin is warm and dry.  Nursing note and vitals reviewed.         Assessment & Plan:  Viral illness Possible secondary sinusitis Viral bronchitis Reactive airway related to the bronchitis Albuterol as directed Warning signs discussed Get antibiotic filled if not turning the corner of the next several days Warning signs were discussed.

## 2014-02-24 ENCOUNTER — Other Ambulatory Visit: Payer: Self-pay | Admitting: Family Medicine

## 2014-03-18 ENCOUNTER — Encounter: Payer: 59 | Admitting: Nurse Practitioner

## 2014-03-27 ENCOUNTER — Other Ambulatory Visit: Payer: Self-pay | Admitting: Family Medicine

## 2014-04-25 ENCOUNTER — Encounter: Payer: 59 | Admitting: Nurse Practitioner

## 2014-05-02 ENCOUNTER — Telehealth: Payer: Self-pay | Admitting: Nurse Practitioner

## 2014-05-02 NOTE — Telephone Encounter (Signed)
bw orders please   Last labs 07/16/13  Vit D, TSH, A1C, Lipase, Amylase, Hep fun, CBC, Lipid

## 2014-05-07 ENCOUNTER — Other Ambulatory Visit: Payer: Self-pay | Admitting: Nurse Practitioner

## 2014-05-07 DIAGNOSIS — E785 Hyperlipidemia, unspecified: Secondary | ICD-10-CM

## 2014-05-07 DIAGNOSIS — R7301 Impaired fasting glucose: Secondary | ICD-10-CM

## 2014-05-07 DIAGNOSIS — R5383 Other fatigue: Secondary | ICD-10-CM

## 2014-05-07 DIAGNOSIS — E039 Hypothyroidism, unspecified: Secondary | ICD-10-CM

## 2014-05-07 NOTE — Telephone Encounter (Signed)
Labs ordered. Please tell her about [facility]

## 2014-05-10 NOTE — Telephone Encounter (Signed)
bw orders ready. Pt notified. Pt to hold off on doing the bw until after she talks with carolyn at her visit because she has not taken cholesterol or thyroid med in 2 weeks bc she was out of town and forgot to take with her.

## 2014-05-14 ENCOUNTER — Encounter: Payer: Self-pay | Admitting: Nurse Practitioner

## 2014-05-17 ENCOUNTER — Ambulatory Visit (INDEPENDENT_AMBULATORY_CARE_PROVIDER_SITE_OTHER): Payer: Self-pay | Admitting: Nurse Practitioner

## 2014-05-17 ENCOUNTER — Encounter: Payer: Self-pay | Admitting: Nurse Practitioner

## 2014-05-17 VITALS — BP 118/82 | Ht 68.0 in | Wt 277.6 lb

## 2014-05-17 DIAGNOSIS — Z01419 Encounter for gynecological examination (general) (routine) without abnormal findings: Secondary | ICD-10-CM

## 2014-05-17 DIAGNOSIS — Z0000 Encounter for general adult medical examination without abnormal findings: Secondary | ICD-10-CM

## 2014-05-17 MED ORDER — LEVOTHYROXINE SODIUM 100 MCG PO TABS: ORAL_TABLET | ORAL | Status: DC

## 2014-05-17 MED ORDER — ROSUVASTATIN CALCIUM 10 MG PO TABS: ORAL_TABLET | ORAL | Status: DC

## 2014-05-19 ENCOUNTER — Encounter: Payer: Self-pay | Admitting: Nurse Practitioner

## 2014-05-19 NOTE — Progress Notes (Signed)
   Subjective:    Patient ID: Debbie Miranda, female    DOB: [birth year], 59 y.o.   MRN: [id number]  HPI presents for her wellness exam. Married, same sexual partner. Regular vision and dental exams. Has had one pneumonia vaccine. Activity has been limited due to achilles surgery. Has not done well with her diet. Was off her meds for about 2 weeks, just recently resumed them. Has had a skin cancer screening.     Review of Systems  Constitutional: Negative for activity change, appetite change and fatigue.  HENT: Negative for dental problem, ear pain, sinus pressure and sore throat.   Respiratory: Negative for cough, chest tightness, shortness of breath and wheezing.   Cardiovascular: Negative for chest pain.  Gastrointestinal: Negative for nausea, vomiting, abdominal pain, diarrhea, constipation and abdominal distention.  Genitourinary: Negative for dysuria, urgency, frequency, vaginal discharge, enuresis, difficulty urinating, genital sores and pelvic pain.       Objective:   Physical Exam  Constitutional: She is oriented to person, place, and time. She appears well-developed. No distress.  HENT:  Right Ear: External ear normal.  Left Ear: External ear normal.  Mouth/Throat: Oropharynx is clear and moist.  Neck: Normal range of motion. Neck supple. No tracheal deviation present. No thyromegaly present.  Partial thyroidectomy; no masses or goiter noted. Non tender to palpation.   Cardiovascular: Normal rate, regular rhythm and normal heart sounds.  Exam reveals no gallop.   No murmur heard. Pulmonary/Chest: Effort normal and breath sounds normal.  Abdominal: Soft. She exhibits no distension. There is no tenderness.  Genitourinary: Vagina normal. No vaginal discharge found.  External GU: no rashes or lesions. Vagina: no discharge. Bimanual exam: no tenderness or obvious masses; exam limited due to abd girth. Rectal exam: no masses; no stool for hemoccult.   Musculoskeletal: She exhibits  no edema.  Lymphadenopathy:    She has no cervical adenopathy.  Neurological: She is alert and oriented to person, place, and time.  Skin: Skin is warm and dry. No rash noted.  Psychiatric: She has a normal mood and affect. Her behavior is normal.  Vitals reviewed. breast exam: mild fine nodularity; axillae no adenopathy.         Assessment & Plan:  Well woman exam - Plan: POC Hemoccult Bld/Stl (3-Cd Home Screen), POC Hemoccult Bld/Stl (3-Cd Home Screen)  Recommend increasing daily activity, healthy diet and weight loss. Defers another pneumonia vaccine at this time (including Prevnar 13). Will call for antibiotics for diverticulosis for trip in January. Daily vitamin D and calcium.  Take meds daily and obtain labs in 2 months.  Return in about 6 months (around 11/17/2014) for recheck.

## 2014-05-23 LAB — POC HEMOCCULT BLD/STL (HOME/3-CARD/SCREEN)
CARD #3 DATE: 5102016
Card #1 Date: 5082016
Card #2 Date: 5092016
Card #2 Fecal Occult Blod, POC: NEGATIVE
FECAL OCCULT BLD: NEGATIVE
FECAL OCCULT BLD: NEGATIVE

## 2014-06-23 ENCOUNTER — Other Ambulatory Visit: Payer: Self-pay | Admitting: Nurse Practitioner

## 2014-06-26 ENCOUNTER — Ambulatory Visit (INDEPENDENT_AMBULATORY_CARE_PROVIDER_SITE_OTHER): Payer: 59 | Admitting: Nurse Practitioner

## 2014-06-26 ENCOUNTER — Encounter: Payer: Self-pay | Admitting: Nurse Practitioner

## 2014-06-26 VITALS — BP 120/76 | Ht 68.0 in | Wt 267.0 lb

## 2014-06-26 DIAGNOSIS — J0120 Acute ethmoidal sinusitis, unspecified: Secondary | ICD-10-CM | POA: Diagnosis not present

## 2014-06-26 MED ORDER — CEFDINIR 300 MG PO CAPS: 300.0000 mg | ORAL_CAPSULE | Freq: Two times a day (BID) | ORAL | Status: DC

## 2014-06-26 MED ORDER — METHYLPREDNISOLONE ACETATE 40 MG/ML IJ SUSP
40.0000 mg | Freq: Once | INTRAMUSCULAR | Status: AC
  Administered 2014-06-26: 40 mg via INTRAMUSCULAR

## 2014-06-26 NOTE — Patient Instructions (Signed)
Nasacort AQ as directed 

## 2014-06-28 ENCOUNTER — Encounter: Payer: Self-pay | Admitting: Nurse Practitioner

## 2014-06-28 NOTE — Progress Notes (Signed)
Subjective:  Presents complaints of sinus symptoms over the past 3 weeks. No fever. Ethmoid sinus area headache. Pressure radiating into the teeth. Occasional cough. Postnasal drainage. No wheezing. Ear pressure. Mild dizziness.  Objective:   BP 120/76 mmHg  Ht 5\' 8"  (1.727 m)  Wt 267 lb (121.11 kg)  BMI 40.61 kg/m2 NAD. Alert, oriented. TMs significant clear effusion, no erythema. Pharynx mildly injected with PND noted. Neck supple with mild soft anterior adenopathy. Lungs clear. Heart regular rate rhythm.  Assessment: Acute ethmoidal sinusitis, recurrence not specified - Plan: methylPREDNISolone acetate (DEPO-MEDROL) injection 40 mg  Plan:  Meds ordered this encounter  Medications  . cefdinir (OMNICEF) 300 MG capsule    Sig: Take 1 capsule (300 mg total) by mouth 2 (two) times daily.    Dispense:  20 capsule    Refill:  0    Order Specific Question:  Supervising Provider    Answer:  Mikey Kirschner [[id number]]  . methylPREDNISolone acetate (DEPO-MEDROL) injection 40 mg    Sig:    Add Nasacort AQ to regimen. Continue OTC meds. Call back in 48 hours if no relief and pressure, may need oral steroids at that time. Patient is planning a trip involving flying in the next couple of weeks.

## 2014-07-02 ENCOUNTER — Telehealth: Payer: Self-pay | Admitting: Nurse Practitioner

## 2014-07-02 MED ORDER — LEVOFLOXACIN 500 MG PO TABS
ORAL_TABLET | ORAL | Status: DC
Start: 2014-07-02 — End: 2015-01-09

## 2014-07-02 NOTE — Telephone Encounter (Signed)
Spoke with patient and informed the patient that Levaquin 500 MG was called into the Pharmacy per Dr.Scott for sinusitis. Patient verbalized understanding.

## 2014-07-02 NOTE — Telephone Encounter (Signed)
Levaquin 1 daily for 10 days, call if ongoing trouble

## 2014-07-02 NOTE — Telephone Encounter (Signed)
How many milligrams of Levaquinn

## 2014-07-02 NOTE — Telephone Encounter (Signed)
Pt is requesting a stronger antibiotic to be called in was seen last week by carolyn.

## 2014-07-02 NOTE — Telephone Encounter (Signed)
Patient seen 6/15 by carolyn for sinus sx for 3 weeks- dx with sinusitis and given Depo Medrol shot and Omnicef

## 2014-07-17 ENCOUNTER — Other Ambulatory Visit: Payer: Self-pay

## 2014-07-17 DIAGNOSIS — Z1231 Encounter for screening mammogram for malignant neoplasm of breast: Secondary | ICD-10-CM

## 2014-08-19 ENCOUNTER — Ambulatory Visit
Admission: RE | Admit: 2014-08-19 | Discharge: 2014-08-19 | Disposition: A | Discharge status: Home / Self Care | Payer: 59 | Source: Ambulatory Visit

## 2014-08-19 DIAGNOSIS — Z1231 Encounter for screening mammogram for malignant neoplasm of breast: Secondary | ICD-10-CM

## 2014-08-20 ENCOUNTER — Other Ambulatory Visit: Payer: Self-pay | Admitting: Family Medicine

## 2014-08-20 DIAGNOSIS — R928 Other abnormal and inconclusive findings on diagnostic imaging of breast: Secondary | ICD-10-CM

## 2014-08-22 ENCOUNTER — Ambulatory Visit
Admission: RE | Admit: 2014-08-22 | Discharge: 2014-08-22 | Disposition: A | Discharge status: Home / Self Care | Payer: 59 | Source: Ambulatory Visit | Attending: Family Medicine | Admitting: Family Medicine

## 2014-08-22 DIAGNOSIS — R928 Other abnormal and inconclusive findings on diagnostic imaging of breast: Secondary | ICD-10-CM

## 2014-09-24 LAB — BASIC METABOLIC PANEL
BUN/Creatinine Ratio: 14 (ref 9–23)
BUN: 11 mg/dL (ref 6–24)
CO2: 25 mmol/L (ref 18–29)
CREATININE: 0.76 mg/dL (ref 0.57–1.00)
Calcium: 8.9 mg/dL (ref 8.7–10.2)
Chloride: 99 mmol/L (ref 97–108)
GFR calc Af Amer: 100 mL/min/{1.73_m2} (ref >59)
GFR calc non Af Amer: 87 mL/min/{1.73_m2} (ref >59)
GLUCOSE: 92 mg/dL (ref 65–99)
Potassium: 4.2 mmol/L (ref 3.5–5.2)
Sodium: 140 mmol/L (ref 134–144)

## 2014-09-24 LAB — LIPID PANEL
Chol/HDL Ratio: 2.7 ratio units (ref 0.0–4.4)
Cholesterol, Total: 143 mg/dL (ref 100–199)
HDL: 53 mg/dL (ref >39)
LDL Calculated: 73 mg/dL (ref 0–99)
Triglycerides: 87 mg/dL (ref 0–149)
VLDL Cholesterol Cal: 17 mg/dL (ref 5–40)

## 2014-09-24 LAB — CBC WITH DIFFERENTIAL/PLATELET
Basophils Absolute: 0 10*3/uL (ref 0.0–0.2)
Basos: 1 %
EOS (ABSOLUTE): 0.1 10*3/uL (ref 0.0–0.4)
EOS: 1 %
HEMATOCRIT: 42.3 % (ref 34.0–46.6)
HEMOGLOBIN: 14.6 g/dL (ref 11.1–15.9)
Immature Grans (Abs): 0 10*3/uL (ref 0.0–0.1)
Immature Granulocytes: 0 %
Lymphocytes Absolute: 1.8 10*3/uL (ref 0.7–3.1)
Lymphs: 28 %
MCH: 32.6 pg (ref 26.6–33.0)
MCHC: 34.5 g/dL (ref 31.5–35.7)
MCV: 94 fL (ref 79–97)
MONOCYTES: 8 %
MONOS ABS: 0.5 10*3/uL (ref 0.1–0.9)
Neutrophils Absolute: 4 10*3/uL (ref 1.4–7.0)
Neutrophils: 62 %
Platelets: 222 10*3/uL (ref 150–379)
RBC: 4.48 x10E6/uL (ref 3.77–5.28)
RDW: 13.8 % (ref 12.3–15.4)
WBC: 6.4 10*3/uL (ref 3.4–10.8)

## 2014-09-24 LAB — HEPATIC FUNCTION PANEL
ALBUMIN: 4.2 g/dL (ref 3.5–5.5)
ALT: 19 IU/L (ref 0–32)
AST: 20 IU/L (ref 0–40)
Alkaline Phosphatase: 65 IU/L (ref 39–117)
BILIRUBIN TOTAL: 0.4 mg/dL (ref 0.0–1.2)
Bilirubin, Direct: 0.12 mg/dL (ref 0.00–0.40)
Total Protein: 6.2 g/dL (ref 6.0–8.5)

## 2014-09-24 LAB — TSH: TSH: 0.712 u[IU]/mL (ref 0.450–4.500)

## 2014-09-24 LAB — VITAMIN D 25 HYDROXY (VIT D DEFICIENCY, FRACTURES): Vit D, 25-Hydroxy: 32.7 ng/mL (ref 30.0–100.0)

## 2014-09-26 ENCOUNTER — Encounter: Payer: Self-pay | Admitting: Nurse Practitioner

## 2014-12-30 ENCOUNTER — Ambulatory Visit (INDEPENDENT_AMBULATORY_CARE_PROVIDER_SITE_OTHER): Payer: 59 | Admitting: Family Medicine

## 2014-12-30 ENCOUNTER — Encounter: Payer: Self-pay | Admitting: Family Medicine

## 2014-12-30 VITALS — BP 140/84 | Temp 97.9°F | Ht 68.0 in | Wt 253.4 lb

## 2014-12-30 DIAGNOSIS — H8110 Benign paroxysmal vertigo, unspecified ear: Secondary | ICD-10-CM

## 2014-12-30 DIAGNOSIS — J0120 Acute ethmoidal sinusitis, unspecified: Secondary | ICD-10-CM

## 2014-12-30 MED ORDER — AMOXICILLIN-POT CLAVULANATE 875-125 MG PO TABS: 1.0000 | ORAL_TABLET | Freq: Two times a day (BID) | ORAL | Status: DC

## 2014-12-30 MED ORDER — MECLIZINE HCL 25 MG PO TABS: 25.0000 mg | ORAL_TABLET | Freq: Three times a day (TID) | ORAL | Status: DC | PRN

## 2014-12-30 NOTE — Progress Notes (Signed)
   Subjective:    Patient ID: Debbie Miranda, female    DOB: [birth year], 59 y.o.   MRN: [id number]  Sinusitis This is a new problem. The current episode started 1 to 4 weeks ago. Associated symptoms include congestion, coughing, ear pain and headaches. Pertinent negatives include no shortness of breath. (Dizziness) Treatments tried: Cold & Sinus HBP. The treatment provided mild relief.   Patient states no other concerns this visit. Patient relates that she's had a few episodes of dizziness over the past week or 2 where she woke up in the morning rolled over felt the room spinning then it would settle down she denied any other particular troubles  Review of Systems  Constitutional: Negative for fever and activity change.  HENT: Positive for congestion, ear pain and rhinorrhea.   Eyes: Negative for discharge.  Respiratory: Positive for cough. Negative for shortness of breath and wheezing.   Cardiovascular: Negative for chest pain.  Neurological: Positive for headaches.       Objective:   Physical Exam  Constitutional: She appears well-developed.  HENT:  Head: Normocephalic.  Nose: Nose normal.  Mouth/Throat: Oropharynx is clear and moist. No oropharyngeal exudate.  Neck: Neck supple.  Cardiovascular: Normal rate and normal heart sounds.   No murmur heard. Pulmonary/Chest: Effort normal and breath sounds normal. She has no wheezes.  Lymphadenopathy:    She has no cervical adenopathy.  Skin: Skin is warm and dry.  Nursing note and vitals reviewed.   Patient with tenderness in the ethmoid sinus region      Assessment & Plan:  Patient was seen today for upper respiratory illness. It is felt that the patient is dealing with sinusitis. Antibiotics were prescribed today. Importance of compliance with medication was discussed. Symptoms should gradually resolve over the course of the next several days. If high fevers, progressive illness, difficulty breathing, worsening condition or  failure for symptoms to improve over the next several days then the patient is to follow-up. If any emergent conditions the patient is to follow-up in the emergency department otherwise to follow-up in the office.  Prescription for scopolamine patches written patient going on long boat ride in several weeks cautions given regarding side effects  Inner ear dysfunction-Epley maneuver was given to use should symptoms get worse  Antivert as needed if ongoing troubles possible referral to ENT

## 2015-01-09 ENCOUNTER — Telehealth: Payer: Self-pay | Admitting: Family Medicine

## 2015-01-09 MED ORDER — LEVOFLOXACIN 500 MG PO TABS: ORAL_TABLET | ORAL | Status: DC

## 2015-01-09 NOTE — Telephone Encounter (Signed)
Called patient and informed her per Dr.Scott Luking-I would recommend Levaquin 500 mg 1 daily for the next 10 days. May stop Augmentin. If not having significant improvement with the dizziness by Tuesday the patient will need to call us and we will help set up with ENT-we will be happy to see the patient back should she prefer as well. Patient verbalized understanding.

## 2015-01-09 NOTE — Telephone Encounter (Signed)
Patient states still sick was seen on Dec. 19 has dizziness, headache.Wanting know if you can call in something else or does she need to be seen again. She states has a trip in two weeks out of the country and she hopes to feel better before then. She uses [facility]

## 2015-01-09 NOTE — Telephone Encounter (Signed)
I would recommend Levaquin 500 mg 1 daily for the next 10 days. May stop Augmentin. If not having significant improvement with the dizziness by Tuesday the patient will need to call us and we will help set up with ENT-we will be happy to see the patient back should she prefer as well.

## 2015-01-09 NOTE — Telephone Encounter (Signed)
Nurse's-please call patient discussed with patient what is going on with her. The more detailed regarding her current symptoms the better I know how to help her

## 2015-01-09 NOTE — Telephone Encounter (Signed)
Seen 12/19  For vertigo and sinusitis. Prescribed antivert and augmentin. Having mild dizziness all the time. About the same as when seen. Taking antivert once daily. Helps a little.  Makes her groggy. Doesn't feel sick. Takes last augmentin tonight. Bilateral ear pain off and on yesterday and today. No fever. Bad headache yesterday. None today.

## 2015-01-16 ENCOUNTER — Ambulatory Visit (INDEPENDENT_AMBULATORY_CARE_PROVIDER_SITE_OTHER): Payer: 59 | Admitting: Family Medicine

## 2015-01-16 ENCOUNTER — Encounter: Payer: Self-pay | Admitting: Family Medicine

## 2015-01-16 VITALS — BP 112/78 | Temp 99.6°F | Ht 68.0 in | Wt 251.0 lb

## 2015-01-16 DIAGNOSIS — J0120 Acute ethmoidal sinusitis, unspecified: Secondary | ICD-10-CM | POA: Diagnosis not present

## 2015-01-16 MED ORDER — CYCLOBENZAPRINE HCL 10 MG PO TABS
10.0000 mg | ORAL_TABLET | Freq: Three times a day (TID) | ORAL | Status: DC | PRN
Start: 2015-01-16 — End: 2015-07-07

## 2015-01-16 MED ORDER — LEVOFLOXACIN 500 MG PO TABS: ORAL_TABLET | ORAL | Status: DC

## 2015-01-16 NOTE — Progress Notes (Signed)
   Subjective:    Patient ID: Debbie Miranda, female    DOB: 29-Mar-1955, 60 y.o.   MRN: [id number]  Cough This is a new problem. Episode onset: 6 weeks ago. Associated symptoms include ear pain, a fever, headaches, nasal congestion, a sore throat and wheezing. Treatments tried:  levaquin, amoxil, mucinex, corcidin, advil cold and sinus.   Pt is going out of country and would like to get rx for a muscle relaxer. Flexeril. States she has taken in the past. Not on med list. Pt takes rarely. Would like to have incase she needs for her back.  Recently seen for sinusitis and treated. Dizziness much better.  Review of Systems  Constitutional: Positive for fever.  HENT: Positive for ear pain and sore throat.   Respiratory: Positive for cough and wheezing.   Neurological: Positive for headaches.       Objective:   Physical Exam  Constitutional: She appears well-developed.  HENT:  Head: Normocephalic.  Nose: Nose normal.  Mouth/Throat: Oropharynx is clear and moist. No oropharyngeal exudate.  Neck: Neck supple.  Cardiovascular: Normal rate and normal heart sounds.   No murmur heard. Pulmonary/Chest: Effort normal and breath sounds normal. She has no wheezes.  Lymphadenopathy:    She has no cervical adenopathy.  Skin: Skin is warm and dry.  Nursing note and vitals reviewed.         Assessment & Plan:  Significant sinusitis and extension of antibiotics for another 10 days. Warning signs were discussed in detail follow-up if progressive troubles. Vertigo symptoms have gotten much better no further intervention necessary may use decongestant nasal spray when flying to [place] and went flying back. If ongoing troubles or problems let us know  Flexeril was prescribed caution drowsiness home use only when the patient is having muscle spasms

## 2015-01-17 ENCOUNTER — Other Ambulatory Visit: Payer: Self-pay | Admitting: *Deleted

## 2015-01-17 MED ORDER — ROSUVASTATIN CALCIUM 10 MG PO TABS: ORAL_TABLET | ORAL | Status: DC

## 2015-04-21 ENCOUNTER — Other Ambulatory Visit: Payer: Self-pay

## 2015-04-21 DIAGNOSIS — Z1231 Encounter for screening mammogram for malignant neoplasm of breast: Secondary | ICD-10-CM

## 2015-05-02 ENCOUNTER — Encounter: Payer: Self-pay | Admitting: Internal Medicine

## 2015-05-07 ENCOUNTER — Telehealth: Payer: Self-pay | Admitting: Nurse Practitioner

## 2015-05-07 ENCOUNTER — Other Ambulatory Visit: Payer: Self-pay | Admitting: Nurse Practitioner

## 2015-05-07 DIAGNOSIS — E559 Vitamin D deficiency, unspecified: Secondary | ICD-10-CM

## 2015-05-07 DIAGNOSIS — E785 Hyperlipidemia, unspecified: Secondary | ICD-10-CM

## 2015-05-07 DIAGNOSIS — R5383 Other fatigue: Secondary | ICD-10-CM

## 2015-05-07 DIAGNOSIS — E039 Hypothyroidism, unspecified: Secondary | ICD-10-CM

## 2015-05-07 DIAGNOSIS — Z79899 Other long term (current) drug therapy: Secondary | ICD-10-CM

## 2015-05-07 NOTE — Telephone Encounter (Signed)
Pt is requesting lab orders to be sent over for an upcoming appt. Last labs per epic were: bmp,cbc,hepatic,lipid,tsh and vit d on 09/23/14

## 2015-05-07 NOTE — Telephone Encounter (Signed)
Nurses: please reorder labs from September. Thanks. Looks like she did not get them done as least not through [facility]

## 2015-05-08 NOTE — Telephone Encounter (Signed)
Left message on voicemail notifying patient that blood work has been sent to pharmacy.

## 2015-05-17 LAB — BASIC METABOLIC PANEL
BUN/Creatinine Ratio: 19 (ref 9–23)
BUN: 13 mg/dL (ref 6–24)
CO2: 25 mmol/L (ref 18–29)
Calcium: 8.9 mg/dL (ref 8.7–10.2)
Chloride: 98 mmol/L (ref 96–106)
Creatinine, Ser: 0.69 mg/dL (ref 0.57–1.00)
GFR calc Af Amer: 110 mL/min/{1.73_m2} (ref >59)
GFR calc non Af Amer: 96 mL/min/{1.73_m2} (ref >59)
GLUCOSE: 95 mg/dL (ref 65–99)
POTASSIUM: 4.1 mmol/L (ref 3.5–5.2)
SODIUM: 139 mmol/L (ref 134–144)

## 2015-05-17 LAB — CBC WITH DIFFERENTIAL/PLATELET
Basophils Absolute: 0 10*3/uL (ref 0.0–0.2)
Basos: 0 %
EOS (ABSOLUTE): 0.1 10*3/uL (ref 0.0–0.4)
Eos: 1 %
Hematocrit: 44.3 % (ref 34.0–46.6)
Hemoglobin: 14.9 g/dL (ref 11.1–15.9)
IMMATURE GRANULOCYTES: 0 %
Immature Grans (Abs): 0 10*3/uL (ref 0.0–0.1)
LYMPHS ABS: 2.1 10*3/uL (ref 0.7–3.1)
Lymphs: 34 %
MCH: 32.3 pg (ref 26.6–33.0)
MCHC: 33.6 g/dL (ref 31.5–35.7)
MCV: 96 fL (ref 79–97)
MONOS ABS: 0.3 10*3/uL (ref 0.1–0.9)
Monocytes: 5 %
NEUTROS PCT: 60 %
Neutrophils Absolute: 3.8 10*3/uL (ref 1.4–7.0)
PLATELETS: 223 10*3/uL (ref 150–379)
RBC: 4.61 x10E6/uL (ref 3.77–5.28)
RDW: 13.5 % (ref 12.3–15.4)
WBC: 6.3 10*3/uL (ref 3.4–10.8)

## 2015-05-17 LAB — VITAMIN D 25 HYDROXY (VIT D DEFICIENCY, FRACTURES): VIT D 25 HYDROXY: 43.2 ng/mL (ref 30.0–100.0)

## 2015-05-17 LAB — HEPATIC FUNCTION PANEL
ALBUMIN: 4.2 g/dL (ref 3.5–5.5)
ALT: 24 IU/L (ref 0–32)
AST: 21 IU/L (ref 0–40)
Alkaline Phosphatase: 67 IU/L (ref 39–117)
Bilirubin Total: 0.4 mg/dL (ref 0.0–1.2)
Bilirubin, Direct: 0.11 mg/dL (ref 0.00–0.40)
TOTAL PROTEIN: 6.7 g/dL (ref 6.0–8.5)

## 2015-05-17 LAB — LIPID PANEL
Chol/HDL Ratio: 2.8 ratio units (ref 0.0–4.4)
Cholesterol, Total: 160 mg/dL (ref 100–199)
HDL: 58 mg/dL (ref >39)
LDL CALC: 83 mg/dL (ref 0–99)
Triglycerides: 93 mg/dL (ref 0–149)
VLDL CHOLESTEROL CAL: 19 mg/dL (ref 5–40)

## 2015-05-17 LAB — TSH: TSH: 2.24 u[IU]/mL (ref 0.450–4.500)

## 2015-05-19 ENCOUNTER — Other Ambulatory Visit: Payer: Self-pay | Admitting: Nurse Practitioner

## 2015-05-21 ENCOUNTER — Encounter: Payer: 59 | Admitting: Nurse Practitioner

## 2015-05-26 ENCOUNTER — Encounter: Payer: Self-pay | Admitting: Nurse Practitioner

## 2015-06-16 ENCOUNTER — Other Ambulatory Visit: Payer: Self-pay | Admitting: Family Medicine

## 2015-06-25 ENCOUNTER — Telehealth: Payer: Self-pay | Admitting: Family Medicine

## 2015-06-25 MED ORDER — ATORVASTATIN CALCIUM 20 MG PO TABS: 20.0000 mg | ORAL_TABLET | Freq: Every day | ORAL | Status: DC

## 2015-06-25 NOTE — Telephone Encounter (Signed)
Not seen since jan? Who prescribed and when?

## 2015-06-25 NOTE — Telephone Encounter (Signed)
Dr.Scott prescribed originally in January 2016. Patient insurance coverage has changed and medication is no longer covered.

## 2015-06-25 NOTE — Telephone Encounter (Signed)
Spoke with patient and informed her per Dr.Steve Luking- We are changing medication to generic Lipitor generic 20 MG. Patient verbalized understanding.

## 2015-06-25 NOTE — Telephone Encounter (Signed)
Patient prescribed Crestor 10 MG  on 06/16/15. Received letter from [employer] stating that the medication is a plan exclusion meaning that the medication is not covered at all by patient current plan. What would you like to prescribe in place of Crestor?

## 2015-06-25 NOTE — Telephone Encounter (Signed)
lipitor generic 20

## 2015-06-30 ENCOUNTER — Ambulatory Visit (INDEPENDENT_AMBULATORY_CARE_PROVIDER_SITE_OTHER): Payer: 59 | Admitting: Nurse Practitioner

## 2015-06-30 ENCOUNTER — Encounter: Payer: Self-pay | Admitting: Nurse Practitioner

## 2015-06-30 ENCOUNTER — Telehealth: Payer: Self-pay | Admitting: Internal Medicine

## 2015-06-30 ENCOUNTER — Other Ambulatory Visit: Payer: Self-pay | Admitting: Nurse Practitioner

## 2015-06-30 ENCOUNTER — Telehealth: Payer: Self-pay | Admitting: Nurse Practitioner

## 2015-06-30 VITALS — BP 130/82 | Ht 68.0 in | Wt 260.4 lb

## 2015-06-30 DIAGNOSIS — Z0000 Encounter for general adult medical examination without abnormal findings: Secondary | ICD-10-CM

## 2015-06-30 DIAGNOSIS — Z01419 Encounter for gynecological examination (general) (routine) without abnormal findings: Secondary | ICD-10-CM

## 2015-06-30 MED ORDER — ROSUVASTATIN CALCIUM 10 MG PO TABS: 10.0000 mg | ORAL_TABLET | Freq: Every day | ORAL | Status: DC

## 2015-06-30 NOTE — Telephone Encounter (Signed)
Patient notified

## 2015-06-30 NOTE — Telephone Encounter (Signed)
rosuvastatin (CRESTOR) 10 MG tablet    Pt is wanting to know if she can go ahead an go with this med That she was taking, her insurance does cover the generic version of  Crestor, cancel the Lipitor and go back to the rosuvastatin as per your Conversation this morning    [facility]

## 2015-06-30 NOTE — Progress Notes (Signed)
   Subjective:    Patient ID: Debbie Miranda, female    DOB: 23-Nov-1955, 60 y.o.   MRN: [id number]  HPI Presents for her wellness exam. Married, same sexual partner. Hysterectomy. Still has one ovary. No pelvic pain. Due for colonoscopy. Recommend recheck with Dr. Carlean Purl her GI specialist. Patient plans to make her own appointment. Regular vision and dental exams. Walking 30-40 minutes 4-5 times per week. Has not used her albuterol inhaler.     Review of Systems  Constitutional: Negative for activity change, appetite change and fatigue.  HENT: Negative for dental problem, ear pain, sinus pressure and sore throat.   Respiratory: Negative for cough, chest tightness, shortness of breath and wheezing.   Cardiovascular: Negative for chest pain.  Gastrointestinal: Positive for diarrhea. Negative for nausea, vomiting, abdominal pain, constipation and abdominal distention.       Off/on diarrhea. Can last for a couple of days then resolves. Has not identified any specific triggers.   Genitourinary: Negative for dysuria, urgency, frequency, vaginal discharge, enuresis, difficulty urinating, genital sores and pelvic pain.       Objective:   Physical Exam  Constitutional: She is oriented to person, place, and time. She appears well-developed. No distress.  HENT:  Right Ear: External ear normal.  Left Ear: External ear normal.  Mouth/Throat: Oropharynx is clear and moist.  Neck: Normal range of motion. Neck supple. No tracheal deviation present. No thyromegaly present.  Part of thyroid surgically absent.   Cardiovascular: Normal rate, regular rhythm and normal heart sounds.  Exam reveals no gallop.   No murmur heard. Pulmonary/Chest: Effort normal and breath sounds normal.  Abdominal: Soft. She exhibits no distension. There is no tenderness.  Genitourinary: Vagina normal. No vaginal discharge found.  External GU: no rashes or lesions. Vagina: mildly injected; no discharge. Bimanual exam: no  tenderness or obvious masses; limited due to extreme abd girth. Rectal exam and hemoccult cards deferred due to GI consult.   Musculoskeletal: She exhibits no edema.  Lymphadenopathy:    She has no cervical adenopathy.  Neurological: She is alert and oriented to person, place, and time.  Skin: Skin is warm and dry. No rash noted.  Psychiatric: She has a normal mood and affect. Her behavior is normal.  Vitals reviewed. Breast exam: chronic tenderness right breast; no masses; axillae no adenopathy. Reviewed recent labs with patient and addressed concerns.        Assessment & Plan:  Well woman exam  Was switched to Lipitor recently due to insurance. Has done well on Crestor. Patient to check with insurance to see if they will cover this and get back with office. Continue daily vit D and calcium in MVI.  Return in about 1 year (around 06/29/2016) for physical.

## 2015-06-30 NOTE — Telephone Encounter (Signed)
Pt states she has been having issues with her IBS-D and she is due for a colon soon. Pt states it does not seem to matter what she eats and she never knows when she is going to have to run to the bathroom. Pt scheduled to see Amy Esterwood PA 07/07/15@11am . Pt aware of appt.

## 2015-06-30 NOTE — Telephone Encounter (Signed)
done

## 2015-07-02 ENCOUNTER — Other Ambulatory Visit: Payer: Self-pay | Admitting: Family Medicine

## 2015-07-07 ENCOUNTER — Encounter: Payer: Self-pay | Admitting: Physician Assistant

## 2015-07-07 ENCOUNTER — Ambulatory Visit (INDEPENDENT_AMBULATORY_CARE_PROVIDER_SITE_OTHER): Payer: 59 | Admitting: Physician Assistant

## 2015-07-07 VITALS — BP 108/76 | HR 76 | Ht 67.75 in | Wt 259.5 lb

## 2015-07-07 DIAGNOSIS — R197 Diarrhea, unspecified: Secondary | ICD-10-CM | POA: Diagnosis not present

## 2015-07-07 DIAGNOSIS — Z1211 Encounter for screening for malignant neoplasm of colon: Secondary | ICD-10-CM | POA: Diagnosis not present

## 2015-07-07 MED ORDER — HYOSCYAMINE SULFATE 0.125 MG SL SUBL: SUBLINGUAL_TABLET | SUBLINGUAL | Status: DC

## 2015-07-07 NOTE — Progress Notes (Signed)
Patient ID: Debbie Miranda, female   DOB: [birth year], 60 y.o.   MRN: [id number]   Subjective:    Patient ID: Debbie Miranda, female    DOB: [birth year], 60 y.o.   MRN: [id number]  HPI  Debbie Miranda is a pleasant 60 year old white female known to Dr. Carlean Purl from prior endoscopy done in 2013 which time she was found to have reflux esophagitis and moderate gastritis. She is referred today by Dr. Nicki Reaper lLuking for possible colonoscopy and evaluation of complaints of recurrent diarrhea. Patient states she's had at least 1 prior colonoscopy and believes that was done by Dr. Laural Golden., Records were reviewed and she did have colonoscopy in 2007 per Dr. Mignon Pine finding of a hyperplastic polyp. She says for the past year or more she has been having very sporadic bouts of diarrhea area she says he's frequently occur at night waking her up and may last for a couple of hours. She says she has multiple episodes of diarrhea and then feels fine. She had 1 episode of diarrhea within the past year that lasted for 3 days. In between episodes she generally does not have any symptoms and says these only occur about once every 6-8 weeks. She's not been able to sort out any known triggers. No melena or hematochezia. Appetite is been fine and weight has been stable. 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  Review of Systems Pertinent positive and negative review of systems were noted in the above HPI section.  All other review of systems was otherwise negative.  Outpatient Encounter Prescriptions as of 07/07/2015  Medication Sig  . aspirin 81 MG tablet Take 81 mg by mouth daily.  Marland Kitchen BIOTIN 5000 PO Take 1 capsule by mouth.  . cholecalciferol (VITAMIN D) 1000 UNITS tablet Take 1,000 Units by mouth daily.  Marland Kitchen co-enzyme Q-10 50 MG capsule Take 50 mg by mouth daily.  Marland Kitchen levothyroxine (SYNTHROID, LEVOTHROID) 100 MCG tablet TAKE ONE (1) TABLET EACH DAY  . Multiple  Vitamins-Minerals (CENTRUM SILVER) tablet Take 1 tablet by mouth daily.  . rosuvastatin (CRESTOR) 10 MG tablet Take 1 tablet (10 mg total) by mouth daily.  . hyoscyamine (LEVSIN/SL) 0.125 MG SL tablet Place 1 tablet on the tongue as needed for diarrhea episodes.  . [DISCONTINUED] albuterol (PROVENTIL HFA;VENTOLIN HFA) 108 (90 BASE) MCG/ACT inhaler Inhale 2 puffs into the lungs every 6 (six) hours as needed for wheezing.  . [DISCONTINUED] cyclobenzaprine (FLEXERIL) 10 MG tablet Take 1 tablet (10 mg total) by mouth 3 (three) times daily as needed for muscle spasms.  . [DISCONTINUED] meclizine (ANTIVERT) 25 MG tablet Take 1 tablet (25 mg total) by mouth 3 (three) times daily as needed for dizziness or nausea. (Patient not taking: Reported on 01/16/2015)   No facility-administered encounter medications on file as of 07/07/2015.   Allergies  Allergen Reactions  . Codeine Nausea And Vomiting   Patient Active Problem List   Diagnosis Date Noted  . Hyperlipidemia 07/10/2013  . Mycotic toenails   . Corn of toe 5th right 08/16/2012  . 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 Impaired fasting glucose 05/16/2012  . Thyroid nodule 05/16/2012  . GERD (gastroesophageal reflux disease) 05/16/2012  . Hypothyroidism 05/16/2012  . Anal itching 10/27/2011  . EXTERNAL HEMORRHOIDS 10/23/2008   Social History   Social History  . Marital Status: Married    Spouse Name: N/A  . Number of Children: 1  . Years  of Education: N/A   Occupational History  . homemaker    Social History Main Topics  . Smoking status: Never Smoker   . Smokeless tobacco: Never Used  . Alcohol Use: 0.0 oz/week    0 Standard drinks or equivalent per week     Comment: wine-occ  . Drug Use: No  . Sexual Activity: Yes    Birth Control/ Protection: Surgical   Other Topics Concern  . Not on file   Social History Narrative    Ms. Corti's family history includes Breast cancer in her maternal grandmother; Cancer in her maternal  grandmother; Heart disease in her maternal grandfather; Kidney cancer in her father. There is no history of Colon cancer.      Objective:    Filed Vitals:   07/07/15 1059  BP: 108/76  Pulse: 76    Physical Exam  well-developed white female in no acute distress, pleasant blood pressure 108/76 pulse 76 height 5 foot 7 weight 259 BMI 39.7. HEENT; nontraumatic, cephalic EOMI PERRLA sclera anicteric, Cardiovascular; regular rate and rhythm with S1-S2 no murmur or gallop, Pulmonary; clear bilaterally Abdomen; large soft nontender nondistended bowel sounds are active there is no palpable mass or hepatosplenomegaly bowel sounds are present, Rectal ;exam not done, GExt;no clubbing cyanosis or edema skin warm and dry, Neuropsych; mood and affect appropriate     Assessment & Plan:   #60 60 year old female with very sporadic bouts of diarrhea occurring every 6-8 weeks over the past year or so, generally occurring at night . Etiology not clear, complete lack of symptoms in between these episodes favors IBS//food intolerance type symptoms #2Colon  Neoplasia screening-last colonoscopy 2007/1 hyperplastic polyp #3 obesity #4 hypothyroidism #5 hyperlipidemia  Plan; Will schedule for colonoscopy with Dr. Carlean Purl. Procedure discussed in detail with the patient including risks and benefits and she is agreeable to proceed Trial of Levsin sublingual when necessary at onset of episodic diarrhea, and may continue Imodium as needed Further plans pending findings at colonoscopy.  Amy Genia Harold PA-C 07/07/2015   Cc: Kathyrn Drown, MD

## 2015-07-07 NOTE — Patient Instructions (Addendum)
You have been scheduled for a colonoscopy. Please follow written instructions given to you at your visit today.  Please pick up your prep supplies at the pharmacy within the next 1-3 days  If you use inhalers (even only as needed), please bring them with you on the day of your procedure. Your physician has requested that you go to www.startemmi.com and enter the access code given to you at your visit today. This web site gives a general overview about your procedure. However, you should still follow specific instructions given to you by our office regarding your preparation for the procedure.  We sent a prescription to [facility], [place].

## 2015-07-18 ENCOUNTER — Encounter: Payer: Self-pay | Admitting: Internal Medicine

## 2015-07-24 ENCOUNTER — Encounter: Payer: Self-pay | Admitting: Internal Medicine

## 2015-07-24 ENCOUNTER — Ambulatory Visit (AMBULATORY_SURGERY_CENTER): Payer: 59 | Admitting: Internal Medicine

## 2015-07-24 VITALS — BP 139/73 | HR 60 | Temp 97.1°F | Resp 12 | Ht 67.75 in | Wt 259.0 lb

## 2015-07-24 DIAGNOSIS — Z1211 Encounter for screening for malignant neoplasm of colon: Secondary | ICD-10-CM | POA: Diagnosis not present

## 2015-07-24 DIAGNOSIS — D122 Benign neoplasm of ascending colon: Secondary | ICD-10-CM

## 2015-07-24 MED ORDER — SODIUM CHLORIDE 0.9 % IV SOLN: 500.0000 mL | INTRAVENOUS | Status: DC

## 2015-07-24 NOTE — Progress Notes (Signed)
A/ox3 pleased with MAC, report to April RN 

## 2015-07-24 NOTE — Patient Instructions (Addendum)
YOU HAD AN ENDOSCOPIC PROCEDURE TODAY AT [facility]:   Refer to the procedure report that was given to you for any specific questions about what was found during the examination.  If the procedure report does not answer your questions, please call your gastroenterologist to clarify.  If you requested that your care partner not be given the details of your procedure findings, then the procedure report has been included in a sealed envelope for you to review at your convenience later.  YOU SHOULD EXPECT: Some feelings of bloating in the abdomen. Passage of more gas than usual.  Walking can help get rid of the air that was put into your GI tract during the procedure and reduce the bloating. If you had a lower endoscopy (such as a colonoscopy or flexible sigmoidoscopy) you may notice spotting of blood in your stool or on the toilet paper. If you underwent a bowel prep for your procedure, you may not have a normal bowel movement for a few days.  Please Note:  You might notice some irritation and congestion in your nose or some drainage.  This is from the oxygen used during your procedure.  There is no need for concern and it should clear up in a day or so.  SYMPTOMS TO REPORT IMMEDIATELY:   Following lower endoscopy (colonoscopy or flexible sigmoidoscopy):  Excessive amounts of blood in the stool  Significant tenderness or worsening of abdominal pains  Swelling of the abdomen that is new, acute  Fever of 100F or higher   For urgent or emergent issues, a gastroenterologist can be reached at any hour by calling [phone].   DIET: Your first meal following the procedure should be a small meal and then it is ok to progress to your normal diet. Heavy or fried foods are harder to digest and may make you feel nauseous or bloated.  Likewise, meals heavy in dairy and vegetables can increase bloating.  Drink plenty of fluids but you should avoid alcoholic beverages for 24  hours.  ACTIVITY:  You should plan to take it easy for the rest of today and you should NOT DRIVE or use heavy machinery until tomorrow (because of the sedation medicines used during the test).    FOLLOW UP: Our staff will call the number listed on your records the next business day following your procedure to check on you and address any questions or concerns that you may have regarding the information given to you following your procedure. If we do not reach you, we will leave a message.  However, if you are feeling well and you are not experiencing any problems, there is no need to return our call.  We will assume that you have returned to your regular daily activities without incident.  If any biopsies were taken you will be contacted by phone or by letter within the next 1-3 weeks.  Please call us at [phone] if you have not heard about the biopsies in 3 weeks.    SIGNATURES/CONFIDENTIALITY: You and/or your care partner have signed paperwork which will be entered into your electronic medical record.  These signatures attest to the fact that that the information above on your After Visit Summary has been reviewed and is understood.  Full responsibility of the confidentiality of this discharge information lies with you and/or your care-partner.  Polyps, diverticulosis, hemorrhoids-handouts given  Repeat colonoscopy will be determined by pathology.

## 2015-07-24 NOTE — Op Note (Signed)
[facility] Patient Name: Debbie Miranda Procedure Date: 07/24/2015 7:52 AM MRN: [id number] Endoscopist: Gatha Mayer , MD Age: 60 Referring MD:  Date of Birth: 26-Sep-1955 Gender: Female Account #: [id number] Procedure:                Colonoscopy Indications:              Screening for colorectal malignant neoplasm (last                            colonoscopy was 10 years ago) Medicines:                Propofol per Anesthesia, Monitored Anesthesia Care Procedure:                Pre-Anesthesia Assessment:                           - Prior to the procedure, a History and Physical                            was performed, and patient medications and                            allergies were reviewed. The patient's tolerance of                            previous anesthesia was also reviewed. The risks                            and benefits of the procedure and the sedation                            options and risks were discussed with the patient.                            All questions were answered, and informed consent                            was obtained. Prior Anticoagulants: The patient                            last took aspirin 1 day prior to the procedure. ASA                            Grade Assessment: II - A patient with mild systemic                            disease. After reviewing the risks and benefits,                            the patient was deemed in satisfactory condition to                            undergo the procedure.  After obtaining informed consent, the colonoscope                            was passed under direct vision. Throughout the                            procedure, the patient's blood pressure, pulse, and                            oxygen saturations were monitored continuously. The                            Model CF-HQ190L [id number]) scope was introduced                            through the anus  and advanced to the the cecum,                            identified by appendiceal orifice and ileocecal                            valve. The colonoscopy was performed without                            difficulty. The patient tolerated the procedure                            well. The quality of the bowel preparation was                            adequate. The bowel preparation used was Miralax.                            The appendiceal orifice and the rectum were                            photographed. Scope In: 8:39:22 AM Scope Out: 8:54:36 AM Scope Withdrawal Time: 0 hours 12 minutes 47 seconds  Total Procedure Duration: 0 hours 15 minutes 14 seconds  Findings:                 The perianal and digital rectal examinations were                            normal.                           A 6 mm polyp was found in the ascending colon. The                            polyp was sessile. The polyp was removed with a                            cold snare. Resection and retrieval were complete.  Verification of patient identification for the                            specimen was done. Estimated blood loss was minimal.                           Multiple small and large-mouthed diverticula were                            found in the sigmoid colon. There was no evidence                            of diverticular bleeding.                           The exam was otherwise without abnormality on                            direct and retroflexion views. Complications:            No immediate complications. Estimated Blood Loss:     Estimated blood loss was minimal. Impression:               - One 6 mm polyp in the ascending colon, removed                            with a cold snare. Resected and retrieved.                           - Diverticulosis in the sigmoid colon. There was no                            evidence of diverticular bleeding.                            - The examination was otherwise normal on direct                            and retroflexion views. Recommendation:           - Patient has a contact number available for                            emergencies. The signs and symptoms of potential                            delayed complications were discussed with the                            patient. Return to normal activities tomorrow.                            Written discharge instructions were provided to the  patient.                           - Resume previous diet.                           - Continue present medications.                           - Repeat colonoscopy is recommended. The                            colonoscopy date will be determined after pathology                            results from today's exam become available for                            review. Gatha Mayer, MD 07/24/2015 9:07:48 AM This report has been signed electronically.

## 2015-07-24 NOTE — Progress Notes (Signed)
Called to room to assist during endoscopic procedure.  Patient ID and intended procedure confirmed with present staff. Received instructions for my participation in the procedure from the performing physician.  

## 2015-07-25 ENCOUNTER — Telehealth: Payer: Self-pay

## 2015-07-25 NOTE — Telephone Encounter (Signed)
  Follow up Call-  Call back number 07/24/2015  Post procedure Call Back phone  # [phone]  Permission to leave phone message Yes     Patient questions:  Do you have a fever, pain , or abdominal swelling? No. Pain Score  0 *  Have you tolerated food without any problems? Yes.    Have you been able to return to your normal activities? Yes.    Do you have any questions about your discharge instructions: Diet   No. Medications  No. Follow up visit  No.  Do you have questions or concerns about your Care? No.  Actions: * If pain score is 4 or above: No action needed, pain <4.

## 2015-07-25 NOTE — Telephone Encounter (Signed)
  Follow up Call-  Call back number 07/24/2015  Post procedure Call Back phone  # [phone]  Permission to leave phone message Yes     Patient questions:  Do you have a fever, pain , or abdominal swelling? No. Pain Score  0 *  Have you tolerated food without any problems? Yes.    Have you been able to return to your normal activities? Yes.    Do you have any questions about your discharge instructions: Diet   No. Medications  No. Follow up visit  No.  Do you have questions or concerns about your Care? No.  Actions: * If pain score is 4 or above: No action needed, pain <4.

## 2015-07-28 NOTE — Progress Notes (Signed)
Agree with Ms. Esterwood's assessment and plan. Carl E. Gessner, MD, FACG   

## 2015-07-30 ENCOUNTER — Encounter: Payer: Self-pay | Admitting: Internal Medicine

## 2015-07-30 DIAGNOSIS — Z860100 Personal history of colon polyps, unspecified: Secondary | ICD-10-CM

## 2015-07-30 HISTORY — DX: Personal history of colonic polyps: Z86.010

## 2015-07-30 HISTORY — DX: Personal history of colon polyps, unspecified: Z86.0100

## 2015-07-30 NOTE — Progress Notes (Signed)
Quick Note:  6 mm ssp - recall 2022 ______

## 2015-08-13 ENCOUNTER — Encounter: Payer: Self-pay | Admitting: Family Medicine

## 2015-09-08 ENCOUNTER — Ambulatory Visit: Payer: 59 | Admitting: Internal Medicine

## 2015-11-25 ENCOUNTER — Other Ambulatory Visit: Payer: Self-pay | Admitting: Family Medicine

## 2015-11-25 DIAGNOSIS — Z1231 Encounter for screening mammogram for malignant neoplasm of breast: Secondary | ICD-10-CM

## 2015-12-01 ENCOUNTER — Ambulatory Visit
Admission: RE | Admit: 2015-12-01 | Discharge: 2015-12-01 | Disposition: A | Discharge status: Home / Self Care | Payer: BLUE CROSS/BLUE SHIELD | Source: Ambulatory Visit

## 2015-12-01 DIAGNOSIS — Z1231 Encounter for screening mammogram for malignant neoplasm of breast: Secondary | ICD-10-CM | POA: Diagnosis not present

## 2015-12-08 DIAGNOSIS — Z23 Encounter for immunization: Secondary | ICD-10-CM | POA: Diagnosis not present

## 2015-12-29 DIAGNOSIS — Z902 Acquired absence of lung [part of]: Secondary | ICD-10-CM | POA: Diagnosis not present

## 2015-12-29 DIAGNOSIS — Z9889 Other specified postprocedural states: Secondary | ICD-10-CM | POA: Diagnosis not present

## 2015-12-29 DIAGNOSIS — R918 Other nonspecific abnormal finding of lung field: Secondary | ICD-10-CM | POA: Diagnosis not present

## 2015-12-29 DIAGNOSIS — D3A090 Benign carcinoid tumor of the bronchus and lung: Secondary | ICD-10-CM | POA: Diagnosis not present

## 2016-02-14 ENCOUNTER — Other Ambulatory Visit: Payer: Self-pay | Admitting: Family Medicine

## 2016-03-13 ENCOUNTER — Other Ambulatory Visit: Payer: Self-pay | Admitting: Nurse Practitioner

## 2016-03-22 ENCOUNTER — Other Ambulatory Visit: Payer: Self-pay | Admitting: Family Medicine

## 2016-03-29 ENCOUNTER — Ambulatory Visit (INDEPENDENT_AMBULATORY_CARE_PROVIDER_SITE_OTHER): Payer: BLUE CROSS/BLUE SHIELD | Admitting: Family Medicine

## 2016-03-29 ENCOUNTER — Encounter: Payer: Self-pay | Admitting: Family Medicine

## 2016-03-29 VITALS — BP 110/70 | Ht 68.0 in | Wt 202.4 lb

## 2016-03-29 DIAGNOSIS — R449 Unspecified symptoms and signs involving general sensations and perceptions: Secondary | ICD-10-CM

## 2016-03-29 DIAGNOSIS — I872 Venous insufficiency (chronic) (peripheral): Secondary | ICD-10-CM

## 2016-03-29 DIAGNOSIS — R4189 Other symptoms and signs involving cognitive functions and awareness: Secondary | ICD-10-CM

## 2016-03-29 HISTORY — DX: Venous insufficiency (chronic) (peripheral): I87.2

## 2016-03-29 MED ORDER — ROSUVASTATIN CALCIUM 10 MG PO TABS: ORAL_TABLET | ORAL | 1 refills | Status: DC

## 2016-03-29 MED ORDER — LEVOTHYROXINE SODIUM 100 MCG PO TABS
ORAL_TABLET | ORAL | 1 refills | Status: DC
Start: 2016-03-29 — End: 2016-11-01

## 2016-03-29 NOTE — Progress Notes (Signed)
Subjective:     Patient ID: Debbie Miranda, female   DOB: [birth year], 61 y.o.   MRN: [id number]  HPI Left hip 3 weeks ago intense pain- then numb-Patient relates intense pain in the left hip region lasted for about 3 weeks then went away he had a localized numbness. Denied any other particular troubles. Now is having localized numbness air does not go down the leg no loss of leg strength. No weakness.  Areas are still numb-over the past few years she's had some intermittent numbness in various small spots that gone numb but no weakness associated with it no pain or discomfort.   #2-swelling in the left leg-patient relates then some days her left leg is bigger than the right leg. No pain in the calf no pitting edema sometimes it's more toward the end of the day. Denies any other particular troubles. Denies any injury.  #3- left eye blood shot intermittent times where the eye is got bloodshot on the left side saw her ophthalmologist they really didn't think anything was wrong no blurriness no itching or watering  Review of Systems Denies fever chills sweats denies chest tightness pressure pain denies sciatica down the leg. Denies injury    Objective:   Physical Exam Lungs clear no crackles heart regular pulse normal extremities the left calf is 1/2 inch greater on the left side than the right side strength in both legs is good negative straight leg raise reflexes brisk on the left side    Assessment:     Sensory nerve compression-I do not feel the patient needs an MRI currently if she starts developing pain or weakness we will need to do an MRI  Probable venous insufficiency in the left calf should gradually stay about the same may need compression stocking if progressive troubles or worse may need other intervention  Left eye bloodshot intermittently probably a variation in blood flow to the surface of the eye not associated with any particular disease    Plan:     Please see  above  Wellness recommended in the summer if her numbness symptoms progress I would recommend MRI

## 2016-05-31 ENCOUNTER — Telehealth: Payer: Self-pay | Admitting: Family Medicine

## 2016-05-31 NOTE — Telephone Encounter (Signed)
Requesting order for blood work for appointment on 06/30/16 with Hoyle Sauer.  Also, patient requests to put on the labs that it is for a physical.

## 2016-06-02 NOTE — Telephone Encounter (Signed)
Please be sure to mark lab orders "for a physical" Please call pt when orders are ready

## 2016-06-03 ENCOUNTER — Other Ambulatory Visit: Payer: Self-pay | Admitting: *Deleted

## 2016-06-03 ENCOUNTER — Other Ambulatory Visit: Payer: Self-pay | Admitting: Nurse Practitioner

## 2016-06-03 DIAGNOSIS — Z0000 Encounter for general adult medical examination without abnormal findings: Secondary | ICD-10-CM

## 2016-06-03 DIAGNOSIS — E039 Hypothyroidism, unspecified: Secondary | ICD-10-CM

## 2016-06-03 DIAGNOSIS — R7301 Impaired fasting glucose: Secondary | ICD-10-CM

## 2016-06-03 DIAGNOSIS — E785 Hyperlipidemia, unspecified: Secondary | ICD-10-CM

## 2016-06-03 DIAGNOSIS — Z79899 Other long term (current) drug therapy: Secondary | ICD-10-CM

## 2016-06-03 NOTE — Telephone Encounter (Signed)
Please order: Lipid, Met 7, CBC with diff, liver, TSH and Hgb A1C. Include physical diagnosis with each one per patient's request. I am not sure if we should include problems from her problem list as well. You may want to check with her. Thanks.  

## 2016-06-03 NOTE — Telephone Encounter (Signed)
Orders ready. Pt notified.  

## 2016-06-04 DIAGNOSIS — Z0000 Encounter for general adult medical examination without abnormal findings: Secondary | ICD-10-CM | POA: Diagnosis not present

## 2016-06-04 DIAGNOSIS — E039 Hypothyroidism, unspecified: Secondary | ICD-10-CM | POA: Diagnosis not present

## 2016-06-04 DIAGNOSIS — R7301 Impaired fasting glucose: Secondary | ICD-10-CM | POA: Diagnosis not present

## 2016-06-04 DIAGNOSIS — E785 Hyperlipidemia, unspecified: Secondary | ICD-10-CM | POA: Diagnosis not present

## 2016-06-04 DIAGNOSIS — Z79899 Other long term (current) drug therapy: Secondary | ICD-10-CM | POA: Diagnosis not present

## 2016-06-05 LAB — BASIC METABOLIC PANEL
BUN/Creatinine Ratio: 13 (ref 12–28)
BUN: 10 mg/dL (ref 8–27)
CALCIUM: 9.2 mg/dL (ref 8.7–10.3)
CHLORIDE: 104 mmol/L (ref 96–106)
CO2: 26 mmol/L (ref 18–29)
Creatinine, Ser: 0.75 mg/dL (ref 0.57–1.00)
GFR, EST AFRICAN AMERICAN: 100 mL/min/{1.73_m2} (ref >59)
GFR, EST NON AFRICAN AMERICAN: 87 mL/min/{1.73_m2} (ref >59)
Glucose: 86 mg/dL (ref 65–99)
POTASSIUM: 4.6 mmol/L (ref 3.5–5.2)
Sodium: 142 mmol/L (ref 134–144)

## 2016-06-05 LAB — CBC WITH DIFFERENTIAL/PLATELET
BASOS ABS: 0 10*3/uL (ref 0.0–0.2)
BASOS: 1 %
EOS (ABSOLUTE): 0.1 10*3/uL (ref 0.0–0.4)
Eos: 2 %
Hematocrit: 43.2 % (ref 34.0–46.6)
Hemoglobin: 14.7 g/dL (ref 11.1–15.9)
Immature Grans (Abs): 0 10*3/uL (ref 0.0–0.1)
Immature Granulocytes: 0 %
Lymphocytes Absolute: 1.8 10*3/uL (ref 0.7–3.1)
Lymphs: 34 %
MCH: 32.5 pg (ref 26.6–33.0)
MCHC: 34 g/dL (ref 31.5–35.7)
MCV: 95 fL (ref 79–97)
MONOS ABS: 0.3 10*3/uL (ref 0.1–0.9)
Monocytes: 5 %
NEUTROS ABS: 3.1 10*3/uL (ref 1.4–7.0)
NEUTROS PCT: 58 %
PLATELETS: 180 10*3/uL (ref 150–379)
RBC: 4.53 x10E6/uL (ref 3.77–5.28)
RDW: 14 % (ref 12.3–15.4)
WBC: 5.3 10*3/uL (ref 3.4–10.8)

## 2016-06-05 LAB — LIPID PANEL
CHOL/HDL RATIO: 2.7 ratio (ref 0.0–4.4)
CHOLESTEROL TOTAL: 171 mg/dL (ref 100–199)
HDL: 63 mg/dL (ref >39)
LDL CALC: 92 mg/dL (ref 0–99)
Triglycerides: 78 mg/dL (ref 0–149)
VLDL CHOLESTEROL CAL: 16 mg/dL (ref 5–40)

## 2016-06-05 LAB — HEPATIC FUNCTION PANEL
ALT: 21 IU/L (ref 0–32)
AST: 21 IU/L (ref 0–40)
Albumin: 4.3 g/dL (ref 3.6–4.8)
Alkaline Phosphatase: 73 IU/L (ref 39–117)
Bilirubin Total: 0.4 mg/dL (ref 0.0–1.2)
Bilirubin, Direct: 0.1 mg/dL (ref 0.00–0.40)
TOTAL PROTEIN: 6.1 g/dL (ref 6.0–8.5)

## 2016-06-05 LAB — TSH: TSH: 1.42 u[IU]/mL (ref 0.450–4.500)

## 2016-06-05 LAB — HEMOGLOBIN A1C
ESTIMATED AVERAGE GLUCOSE: 108 mg/dL
HEMOGLOBIN A1C: 5.4 % (ref 4.8–5.6)

## 2016-06-06 ENCOUNTER — Encounter: Payer: Self-pay | Admitting: Family Medicine

## 2016-06-08 ENCOUNTER — Other Ambulatory Visit: Payer: Self-pay | Admitting: Family Medicine

## 2016-06-08 DIAGNOSIS — Z1231 Encounter for screening mammogram for malignant neoplasm of breast: Secondary | ICD-10-CM

## 2016-06-29 DIAGNOSIS — H2513 Age-related nuclear cataract, bilateral: Secondary | ICD-10-CM | POA: Diagnosis not present

## 2016-06-29 DIAGNOSIS — H52203 Unspecified astigmatism, bilateral: Secondary | ICD-10-CM | POA: Diagnosis not present

## 2016-06-30 ENCOUNTER — Encounter: Payer: Self-pay | Admitting: Nurse Practitioner

## 2016-06-30 ENCOUNTER — Encounter: Payer: 59 | Admitting: Family Medicine

## 2016-06-30 ENCOUNTER — Ambulatory Visit (INDEPENDENT_AMBULATORY_CARE_PROVIDER_SITE_OTHER): Payer: BLUE CROSS/BLUE SHIELD | Admitting: Nurse Practitioner

## 2016-06-30 VITALS — BP 124/78 | Ht 68.0 in | Wt 187.0 lb

## 2016-06-30 DIAGNOSIS — Z01419 Encounter for gynecological examination (general) (routine) without abnormal findings: Secondary | ICD-10-CM

## 2016-06-30 DIAGNOSIS — Z0000 Encounter for general adult medical examination without abnormal findings: Secondary | ICD-10-CM | POA: Diagnosis not present

## 2016-06-30 DIAGNOSIS — M792 Neuralgia and neuritis, unspecified: Secondary | ICD-10-CM

## 2016-06-30 HISTORY — DX: Neuralgia and neuritis, unspecified: M79.2

## 2016-06-30 MED ORDER — AMITRIPTYLINE HCL 10 MG PO TABS: 10.0000 mg | ORAL_TABLET | Freq: Every day | ORAL | 2 refills | Status: DC

## 2016-06-30 NOTE — Progress Notes (Signed)
   Subjective:    Patient ID: Debbie Miranda, female    DOB: 04-06-1955, 61 y.o.   MRN: [id number]  HPI presents for her wellness exam. Doing well with weight loss. Regular exercise. Same sexual partner. No pelvic pain. Still has right ovary. Vision exam yesterday. Regular dental exams. Yearly skin cancer screening. Has intermittent nerve pain in various parts of the body which she states she has discussed with Dr. Nicki Reaper. Has sporadic severe pain at the tip of the right great toe. Occurs every 2-3 months. Very brief lasting a couple of seconds. No specific triggers. Severe to the point that she yells with pain.     Review of Systems  Constitutional: Negative for activity change, appetite change and fatigue.  HENT: Negative for dental problem, ear pain, sinus pressure and sore throat.   Respiratory: Negative for cough, chest tightness, shortness of breath and wheezing.   Cardiovascular: Negative for chest pain.  Gastrointestinal: Negative for abdominal distention, abdominal pain, constipation, diarrhea, nausea and vomiting.  Genitourinary: Negative for difficulty urinating, dysuria, enuresis, frequency, genital sores, pelvic pain, urgency and vaginal discharge.       Objective:   Physical Exam  Constitutional: She is oriented to person, place, and time. She appears well-developed. No distress.  HENT:  Right Ear: External ear normal.  Left Ear: External ear normal.  Mouth/Throat: Oropharynx is clear and moist.  Neck: Normal range of motion. Neck supple. No tracheal deviation present. No thyromegaly present.  Cardiovascular: Normal rate, regular rhythm and normal heart sounds.  Exam reveals no gallop.   No murmur heard. Pulmonary/Chest: Effort normal and breath sounds normal.  Abdominal: Soft. She exhibits no distension. There is no tenderness.  Genitourinary: Vagina normal. No vaginal discharge found.  Genitourinary Comments: External GU: no rashes or lesions. Vagina: no discharge.  Slightly pale. Bimanual exam: no tenderness or obvious masses.   Musculoskeletal: She exhibits no edema.  Lymphadenopathy:    She has no cervical adenopathy.  Neurological: She is alert and oriented to person, place, and time.  Skin: Skin is warm and dry. No rash noted.  Psychiatric: She has a normal mood and affect. Her behavior is normal.  Vitals reviewed. Breast exam: no masses; axillae no adenopathy.         Assessment & Plan:   Problem List Items Addressed This Visit      Other   Neuropathic pain    Other Visit Diagnoses    Well woman exam    -  Primary     Meds ordered this encounter  Medications  . amitriptyline (ELAVIL) 10 MG tablet    Sig: Take 1 tablet (10 mg total) by mouth at bedtime.    Dispense:  30 tablet    Refill:  2    Order Specific Question:   Supervising Provider    Answer:   Mikey Kirschner [[id number]]   Discussed options for neuropathic pain. Trial of low dose nortriptyline. Call back if any adverse effects or if no improvement. Continue weight loss and activity.  Return in about 1 year (around 06/30/2017) for physical.

## 2016-07-23 ENCOUNTER — Telehealth: Payer: Self-pay | Admitting: *Deleted

## 2016-07-23 ENCOUNTER — Other Ambulatory Visit: Payer: Self-pay | Admitting: Nurse Practitioner

## 2016-07-23 MED ORDER — CIPROFLOXACIN HCL 500 MG PO TABS: 500.0000 mg | ORAL_TABLET | Freq: Two times a day (BID) | ORAL | 0 refills | Status: DC

## 2016-07-23 MED ORDER — METRONIDAZOLE 500 MG PO TABS
500.0000 mg | ORAL_TABLET | Freq: Three times a day (TID) | ORAL | 0 refills | Status: DC
Start: 2016-07-23 — End: 2016-08-16

## 2016-07-23 NOTE — Telephone Encounter (Signed)
Patient called requesting a refill on her diverticulitis medication. Please advise [phone]

## 2016-07-23 NOTE — Telephone Encounter (Signed)
Done. Nurse notified patient. Call back if any problems.

## 2016-07-23 NOTE — Telephone Encounter (Signed)
Pt states she is getting ready to go out of town. Seen for physical in June. States dr Nicki Reaper usually calls her something in for diverticulitis. Advised pt she may need appt bc dr Nicki Reaper is out of office. States she usually gets cipro and flagyl. Started having pain on left side yesterday. No fever, no vomiting, no diarrhea. Some nausea. States it is the same way as always. Wanted message to go to [facility] since she saw her recently.  [facility].

## 2016-07-28 DIAGNOSIS — D485 Neoplasm of uncertain behavior of skin: Secondary | ICD-10-CM | POA: Diagnosis not present

## 2016-07-28 DIAGNOSIS — D225 Melanocytic nevi of trunk: Secondary | ICD-10-CM | POA: Diagnosis not present

## 2016-07-28 DIAGNOSIS — Z85828 Personal history of other malignant neoplasm of skin: Secondary | ICD-10-CM | POA: Diagnosis not present

## 2016-07-28 DIAGNOSIS — D2262 Melanocytic nevi of left upper limb, including shoulder: Secondary | ICD-10-CM | POA: Diagnosis not present

## 2016-07-28 DIAGNOSIS — L570 Actinic keratosis: Secondary | ICD-10-CM | POA: Diagnosis not present

## 2016-07-28 DIAGNOSIS — L814 Other melanin hyperpigmentation: Secondary | ICD-10-CM | POA: Diagnosis not present

## 2016-07-28 DIAGNOSIS — D2261 Melanocytic nevi of right upper limb, including shoulder: Secondary | ICD-10-CM | POA: Diagnosis not present

## 2016-08-03 ENCOUNTER — Telehealth: Payer: Self-pay | Admitting: Family Medicine

## 2016-08-03 NOTE — Telephone Encounter (Signed)
Atypical nevi not cancer followed by dermatology

## 2016-08-03 NOTE — Telephone Encounter (Signed)
Please see dermatopathology report in red folder in office. 

## 2016-08-16 ENCOUNTER — Encounter: Payer: Self-pay | Admitting: Nurse Practitioner

## 2016-08-16 ENCOUNTER — Ambulatory Visit (INDEPENDENT_AMBULATORY_CARE_PROVIDER_SITE_OTHER): Payer: BLUE CROSS/BLUE SHIELD | Admitting: Nurse Practitioner

## 2016-08-16 VITALS — BP 110/64 | Temp 98.6°F | Ht 68.0 in | Wt 178.2 lb

## 2016-08-16 DIAGNOSIS — L237 Allergic contact dermatitis due to plants, except food: Secondary | ICD-10-CM | POA: Diagnosis not present

## 2016-08-16 DIAGNOSIS — R600 Localized edema: Secondary | ICD-10-CM

## 2016-08-16 DIAGNOSIS — R609 Edema, unspecified: Secondary | ICD-10-CM

## 2016-08-16 MED ORDER — CIPROFLOXACIN HCL 500 MG PO TABS: 500.0000 mg | ORAL_TABLET | Freq: Two times a day (BID) | ORAL | 0 refills | Status: DC

## 2016-08-16 MED ORDER — HYDROCHLOROTHIAZIDE 25 MG PO TABS: 25.0000 mg | ORAL_TABLET | Freq: Every day | ORAL | 2 refills | Status: DC

## 2016-08-16 MED ORDER — METHYLPREDNISOLONE ACETATE 40 MG/ML IJ SUSP
40.0000 mg | Freq: Once | INTRAMUSCULAR | Status: AC
  Administered 2016-08-16: 40 mg via INTRAMUSCULAR

## 2016-08-16 MED ORDER — METRONIDAZOLE 500 MG PO TABS: 500.0000 mg | ORAL_TABLET | Freq: Three times a day (TID) | ORAL | 0 refills | Status: DC

## 2016-08-16 MED ORDER — PREDNISONE 20 MG PO TABS: ORAL_TABLET | ORAL | 0 refills | Status: DC

## 2016-08-16 NOTE — Patient Instructions (Addendum)
Loratadine 10 mg in the morning Benadryl 25 mg at night Pepcid or Zantac daily   Poison Ivy Dermatitis Poison ivy dermatitis is inflammation of the skin that is caused by the allergens on the leaves of the poison ivy plant. The skin reaction often involves redness, swelling, blisters, and extreme itching. What are the causes? This condition is caused by a specific chemical (urushiol) found in the sap of the poison ivy plant. This chemical is sticky and can be easily spread to people, animals, and objects. You can get poison ivy dermatitis by:  Having direct contact with a poison ivy plant.  Touching animals, other people, or objects that have come in contact with poison ivy and have the chemical on them.  What increases the risk? This condition is more likely to develop in:  People who are outdoors often.  People who go outdoors without wearing protective clothing, such as closed shoes, long pants, and a long-sleeved shirt.  What are the signs or symptoms? Symptoms of this condition include:  Redness and itching.  A rash that often includes bumps and blisters. The rash usually appears 48 hours after exposure.  Swelling. This may occur if the reaction is more severe.  Symptoms usually last for 1-2 weeks. However, the first time you develop this condition, symptoms may last 3-4 weeks. How is this diagnosed? This condition may be diagnosed based on your symptoms and a physical exam. Your health care provider may also ask you about any recent outdoor activity. How is this treated? Treatment for this condition will vary depending on how severe it is. Treatment may include:  Hydrocortisone creams or calamine lotions to relieve itching.  Oatmeal baths to soothe the skin.  Over-the-counter antihistamine tablets.  Oral steroid medicine for more severe outbreaks.  Follow these instructions at home:  Take or apply over-the-counter and prescription medicines only as told by your  health care provider.  Wash exposed skin as soon as possible with soap and cold water.  Use hydrocortisone creams or calamine lotion as needed to soothe the skin and relieve itching.  Take oatmeal baths as needed. Use colloidal oatmeal. You can get this at your local pharmacy or grocery store. Follow the instructions on the packaging.  Do not scratch or rub your skin.  While you have the rash, wash clothes right after you wear them. How is this prevented?  Learn to identify the poison ivy plant and avoid contact with the plant. This plant can be recognized by the number of leaves. Generally, poison ivy has three leaves with flowering branches on a single stem. The leaves are typically glossy, and they have jagged edges that come to a point at the front.  If you have been exposed to poison ivy, thoroughly wash with soap and water right away. You have about 30 minutes to remove the plant resin before it will cause the rash. Be sure to wash under your fingernails because any plant resin there will continue to spread the rash.  When hiking or camping, wear clothes that will help you to avoid exposure on the skin. This includes long pants, a long-sleeved shirt, tall socks, and hiking boots. You can also apply preventive lotion to your skin to help limit exposure.  If you suspect that your clothes or outdoor gear came in contact with poison ivy, rinse them off outside with a garden hose before you bring them inside your house. Contact a health care provider if:  You have open sores in the  rash area.  You have more redness, swelling, or pain in the affected area.  You have redness that spreads beyond the rash area.  You have fluid, blood, or pus coming from the affected area.  You have a fever.  You have a rash over a large area of your body.  You have a rash on your eyes, mouth, or genitals.  Your rash does not improve after a few days. Get help right away if:  Your face swells or  your eyes swell shut.  You have trouble breathing.  You have trouble swallowing. This information is not intended to replace advice given to you by your health care provider. Make sure you discuss any questions you have with your health care provider. Document Released: 12/26/1999 Document Revised: 06/05/2015 Document Reviewed: 06/05/2014 Elsevier Interactive Patient Education  [employer].

## 2016-08-18 ENCOUNTER — Encounter: Payer: Self-pay | Admitting: Nurse Practitioner

## 2016-08-18 NOTE — Progress Notes (Signed)
Subjective:  Presents for complaints of poison oak rash that began about 4 days ago. Minimal relief with calamine lotion. Has been slowly spreading. Started on the lower legs after working in the yard. Has spread slightly on the upper thighs and arms. Very pruritic. No fever. Also having mild swelling in the lower legs after taking a recent trip lasting 5 hours plus each way. No chest pain shortness of breath or orthopnea.  Objective:   BP 110/64   Temp 98.6 F (37 C) (Oral)   Ht 5\' 8"  (1.727 m)   Wt 178 lb 4 oz (80.9 kg)   BMI 27.10 kg/m  NAD. Alert, oriented. Lungs clear. Heart regular rate rhythm. Multiple fine patches of papular/vesicular rash noted mainly on the lower legs with scattered areas on the upper legs and lower arms. One small patch on the left side of the face near the chin. Trace pitting edema both ankles.  Assessment:  Allergic contact dermatitis due to plants, except food - Plan: methylPREDNISolone acetate (DEPO-MEDROL) injection 40 mg  Mild peripheral edema    Plan:   Meds ordered this encounter  Medications  . hydrochlorothiazide (HYDRODIURIL) 25 MG tablet    Sig: Take 1 tablet (25 mg total) by mouth daily.    Dispense:  30 tablet    Refill:  2    Order Specific Question:   Supervising Provider    Answer:   Mikey Kirschner [[id number]]  . ciprofloxacin (CIPRO) 500 MG tablet    Sig: Take 1 tablet (500 mg total) by mouth 2 (two) times daily.    Dispense:  20 tablet    Refill:  0    Order Specific Question:   Supervising Provider    Answer:   Mikey Kirschner [[id number]]  . metroNIDAZOLE (FLAGYL) 500 MG tablet    Sig: Take 1 tablet (500 mg total) by mouth 3 (three) times daily.    Dispense:  30 tablet    Refill:  0    Order Specific Question:   Supervising Provider    Answer:   Mikey Kirschner [[id number]]  . predniSONE (DELTASONE) 20 MG tablet    Sig: 3 po qd x 3 d then 2 po qd x 3 d then 1 po qd x 2 d; Start 8/7 am    Dispense:  17 tablet    Refill:  0    Order  Specific Question:   Supervising Provider    Answer:   Mikey Kirschner [[id number]]  . methylPREDNISolone acetate (DEPO-MEDROL) injection 40 mg   Depo-Medrol 40 mg IM now. Start oral prednisone tomorrow. OTC meds as directed for itching. Given HCTZ to take daily as needed for swelling. Current edema most likely related to recent prolonged trip. Given one-time prescription for Cipro and Flagyl if needed for diverticulitis flareup per patient request. Patient to notify the office if she has to take medications. Call back by the end of the week if no improvement in rash, sooner if worse.

## 2016-09-08 ENCOUNTER — Other Ambulatory Visit: Payer: Self-pay | Admitting: *Deleted

## 2016-09-08 MED ORDER — HYDROCHLOROTHIAZIDE 25 MG PO TABS: 25.0000 mg | ORAL_TABLET | Freq: Every day | ORAL | 0 refills | Status: DC

## 2016-10-02 ENCOUNTER — Other Ambulatory Visit: Payer: Self-pay | Admitting: Nurse Practitioner

## 2016-10-05 ENCOUNTER — Other Ambulatory Visit: Payer: Self-pay

## 2016-10-13 DIAGNOSIS — Z23 Encounter for immunization: Secondary | ICD-10-CM | POA: Diagnosis not present

## 2016-11-01 ENCOUNTER — Other Ambulatory Visit: Payer: Self-pay | Admitting: Family Medicine

## 2016-12-07 ENCOUNTER — Ambulatory Visit
Admission: RE | Admit: 2016-12-07 | Discharge: 2016-12-07 | Disposition: A | Discharge status: Home / Self Care | Payer: BLUE CROSS/BLUE SHIELD | Source: Ambulatory Visit | Attending: Family Medicine | Admitting: Family Medicine

## 2016-12-07 DIAGNOSIS — Z1231 Encounter for screening mammogram for malignant neoplasm of breast: Secondary | ICD-10-CM | POA: Diagnosis not present

## 2016-12-13 DIAGNOSIS — Z902 Acquired absence of lung [part of]: Secondary | ICD-10-CM | POA: Diagnosis not present

## 2016-12-13 DIAGNOSIS — R918 Other nonspecific abnormal finding of lung field: Secondary | ICD-10-CM | POA: Diagnosis not present

## 2016-12-13 DIAGNOSIS — D3A090 Benign carcinoid tumor of the bronchus and lung: Secondary | ICD-10-CM | POA: Diagnosis not present

## 2017-01-17 ENCOUNTER — Other Ambulatory Visit: Payer: Self-pay | Admitting: Family Medicine

## 2017-02-07 ENCOUNTER — Other Ambulatory Visit: Payer: Self-pay | Admitting: Family Medicine

## 2017-02-14 ENCOUNTER — Other Ambulatory Visit: Payer: Self-pay | Admitting: Family Medicine

## 2017-03-15 DIAGNOSIS — M7542 Impingement syndrome of left shoulder: Secondary | ICD-10-CM | POA: Diagnosis not present

## 2017-03-15 DIAGNOSIS — M25512 Pain in left shoulder: Secondary | ICD-10-CM | POA: Diagnosis not present

## 2017-03-29 DIAGNOSIS — M25512 Pain in left shoulder: Secondary | ICD-10-CM | POA: Diagnosis not present

## 2017-04-30 ENCOUNTER — Other Ambulatory Visit: Payer: Self-pay | Admitting: Family Medicine

## 2017-05-14 ENCOUNTER — Other Ambulatory Visit: Payer: Self-pay | Admitting: Family Medicine

## 2017-07-12 DIAGNOSIS — H5203 Hypermetropia, bilateral: Secondary | ICD-10-CM | POA: Diagnosis not present

## 2017-07-12 DIAGNOSIS — H2513 Age-related nuclear cataract, bilateral: Secondary | ICD-10-CM | POA: Diagnosis not present

## 2017-08-08 ENCOUNTER — Other Ambulatory Visit: Payer: Self-pay

## 2017-08-08 ENCOUNTER — Telehealth: Payer: Self-pay | Admitting: Family Medicine

## 2017-08-08 MED ORDER — AMITRIPTYLINE HCL 10 MG PO TABS: 10.0000 mg | ORAL_TABLET | Freq: Every day | ORAL | 1 refills | Status: DC

## 2017-08-08 NOTE — Telephone Encounter (Signed)
Requesting refill on amitriptyline (ELAVIL) 10 MG tablet to her new pharmacy [facility] in [place], [place].

## 2017-08-08 NOTE — Telephone Encounter (Signed)
May have 30-day with 1 refill needs a follow-up office visit explained to the patient we must see her at least on a yearly basis for medication purposes

## 2017-08-08 NOTE — Telephone Encounter (Signed)
Refill sent and pt notified. Pt is setting up appointment.

## 2017-08-08 NOTE — Telephone Encounter (Signed)
Last med check June 2018

## 2017-08-29 ENCOUNTER — Other Ambulatory Visit: Payer: Self-pay | Admitting: Family Medicine

## 2017-08-29 DIAGNOSIS — Z1231 Encounter for screening mammogram for malignant neoplasm of breast: Secondary | ICD-10-CM

## 2017-09-01 DIAGNOSIS — M25512 Pain in left shoulder: Secondary | ICD-10-CM | POA: Diagnosis not present

## 2017-09-06 ENCOUNTER — Encounter: Payer: BLUE CROSS/BLUE SHIELD | Admitting: Family Medicine

## 2017-09-13 ENCOUNTER — Telehealth: Payer: Self-pay | Admitting: Family Medicine

## 2017-09-13 DIAGNOSIS — E039 Hypothyroidism, unspecified: Secondary | ICD-10-CM

## 2017-09-13 DIAGNOSIS — Z1159 Encounter for screening for other viral diseases: Secondary | ICD-10-CM

## 2017-09-13 DIAGNOSIS — Z79899 Other long term (current) drug therapy: Secondary | ICD-10-CM

## 2017-09-13 DIAGNOSIS — R7301 Impaired fasting glucose: Secondary | ICD-10-CM

## 2017-09-13 DIAGNOSIS — Z114 Encounter for screening for human immunodeficiency virus [HIV]: Secondary | ICD-10-CM

## 2017-09-13 DIAGNOSIS — E785 Hyperlipidemia, unspecified: Secondary | ICD-10-CM

## 2017-09-13 NOTE — Telephone Encounter (Signed)
Last labs 06/04/16 a1c, tsh, liver, cbc, bmp, lipid

## 2017-09-13 NOTE — Telephone Encounter (Signed)
Blood work ordered in Epic. Left message to return call to notify patient. 

## 2017-09-13 NOTE — Telephone Encounter (Signed)
Lipid, liver, metabolic 7, TSH Diuretic use, hyperlipidemia, hypothyroidism HIV antibody, hepatitis C antibody-per CDC guidelines

## 2017-09-13 NOTE — Telephone Encounter (Signed)
Pt has a physical scheduled with Ria Comment on 11/01/17. She would like to know if blood work can be done before the appt.

## 2017-09-14 NOTE — Telephone Encounter (Signed)
Pt.notified

## 2017-09-22 ENCOUNTER — Other Ambulatory Visit: Payer: Self-pay | Admitting: Family Medicine

## 2017-09-26 DIAGNOSIS — Z85828 Personal history of other malignant neoplasm of skin: Secondary | ICD-10-CM | POA: Diagnosis not present

## 2017-09-26 DIAGNOSIS — D2261 Melanocytic nevi of right upper limb, including shoulder: Secondary | ICD-10-CM | POA: Diagnosis not present

## 2017-09-26 DIAGNOSIS — L281 Prurigo nodularis: Secondary | ICD-10-CM | POA: Diagnosis not present

## 2017-10-28 DIAGNOSIS — R7301 Impaired fasting glucose: Secondary | ICD-10-CM | POA: Diagnosis not present

## 2017-10-28 DIAGNOSIS — E785 Hyperlipidemia, unspecified: Secondary | ICD-10-CM | POA: Diagnosis not present

## 2017-10-28 DIAGNOSIS — Z79899 Other long term (current) drug therapy: Secondary | ICD-10-CM | POA: Diagnosis not present

## 2017-10-28 DIAGNOSIS — E039 Hypothyroidism, unspecified: Secondary | ICD-10-CM | POA: Diagnosis not present

## 2017-10-29 LAB — LIPID PANEL
CHOLESTEROL TOTAL: 196 mg/dL (ref 100–199)
Chol/HDL Ratio: 2.5 ratio (ref 0.0–4.4)
HDL: 77 mg/dL (ref >39)
LDL Calculated: 103 mg/dL — ABNORMAL HIGH (ref 0–99)
TRIGLYCERIDES: 79 mg/dL (ref 0–149)
VLDL CHOLESTEROL CAL: 16 mg/dL (ref 5–40)

## 2017-10-29 LAB — HEPATIC FUNCTION PANEL
ALT: 25 IU/L (ref 0–32)
AST: 25 IU/L (ref 0–40)
Albumin: 4.3 g/dL (ref 3.6–4.8)
Alkaline Phosphatase: 66 IU/L (ref 39–117)
BILIRUBIN, DIRECT: 0.14 mg/dL (ref 0.00–0.40)
Bilirubin Total: 0.5 mg/dL (ref 0.0–1.2)
TOTAL PROTEIN: 6.4 g/dL (ref 6.0–8.5)

## 2017-10-29 LAB — TSH: TSH: 4.95 u[IU]/mL — AB (ref 0.450–4.500)

## 2017-10-29 LAB — HEPATITIS C ANTIBODY: Hep C Virus Ab: <0.1 s/co ratio (ref 0.0–0.9)

## 2017-10-29 LAB — BASIC METABOLIC PANEL
BUN / CREAT RATIO: 18 (ref 12–28)
BUN: 13 mg/dL (ref 8–27)
CO2: 26 mmol/L (ref 20–29)
CREATININE: 0.74 mg/dL (ref 0.57–1.00)
Calcium: 9.3 mg/dL (ref 8.7–10.3)
Chloride: 100 mmol/L (ref 96–106)
GFR calc Af Amer: 101 mL/min/{1.73_m2} (ref >59)
GFR calc non Af Amer: 88 mL/min/{1.73_m2} (ref >59)
GLUCOSE: 84 mg/dL (ref 65–99)
POTASSIUM: 4 mmol/L (ref 3.5–5.2)
Sodium: 141 mmol/L (ref 134–144)

## 2017-10-29 LAB — HIV ANTIBODY (ROUTINE TESTING W REFLEX): HIV Screen 4th Generation wRfx: NONREACTIVE

## 2017-11-01 ENCOUNTER — Ambulatory Visit (INDEPENDENT_AMBULATORY_CARE_PROVIDER_SITE_OTHER): Payer: BLUE CROSS/BLUE SHIELD | Admitting: Family Medicine

## 2017-11-01 ENCOUNTER — Encounter: Payer: Self-pay | Admitting: Family Medicine

## 2017-11-01 VITALS — BP 110/80 | Ht 68.0 in | Wt 200.0 lb

## 2017-11-01 DIAGNOSIS — E785 Hyperlipidemia, unspecified: Secondary | ICD-10-CM | POA: Diagnosis not present

## 2017-11-01 DIAGNOSIS — R002 Palpitations: Secondary | ICD-10-CM

## 2017-11-01 DIAGNOSIS — Z0000 Encounter for general adult medical examination without abnormal findings: Secondary | ICD-10-CM

## 2017-11-01 DIAGNOSIS — E039 Hypothyroidism, unspecified: Secondary | ICD-10-CM | POA: Diagnosis not present

## 2017-11-01 MED ORDER — ROSUVASTATIN CALCIUM 10 MG PO TABS: ORAL_TABLET | ORAL | 1 refills | Status: DC

## 2017-11-01 MED ORDER — LEVOTHYROXINE SODIUM 100 MCG PO TABS: ORAL_TABLET | ORAL | 1 refills | Status: DC

## 2017-11-01 NOTE — Progress Notes (Signed)
Subjective:    Patient ID: Debbie Miranda, female    DOB: 1956-01-01, 62 y.o.   MRN: [id number]  HPI The patient comes in today for a wellness visit.  A review of their health history was completed. A review of medications was also completed.  Any needed refills; yes  Eating habits: good   Falls/  MVA accidents in past few months: no  Regular exercise: walking- avg 1.5 miles per day  Specialist pt sees on regular basis: no  Preventative health issues were discussed.   Additional concerns: wants to discuss coming off of elavil, and Hctz. Was started on elavil about 1 year ago for possible nerve pain in her toes bilaterally at night. Reports elavil has been helpful but would like to try coming off of medication. Was started on HCTZ for leg edema, but would like to come off now as swelling has improved.   Thyroid: Has missed about 25% of doses in the last few months due to a recent move. Asymptomatic, denies any adverse effects.  Hyperlipidemia: Takes crestor sporadically, denies any adverse effects.  Review of Systems  Constitutional: Negative for chills, fatigue, fever and unexpected weight change.  HENT: Negative for congestion, ear pain, sinus pressure, sinus pain and sore throat.   Eyes: Negative for discharge and visual disturbance.  Respiratory: Negative for cough, shortness of breath and wheezing.   Cardiovascular: Positive for palpitations. Negative for chest pain and leg swelling.       Long hx of palpitations, pt reports has been seen by cardiologist in the past. Denies any associated symptoms of chest pain, shortness of breath, dizziness, or syncope. Reports lasts for about 10 seconds, occurs sometimes 2-3 times per week and other times not for a couple months.   Gastrointestinal: Negative for abdominal pain, blood in stool, constipation, diarrhea, nausea and vomiting.  Genitourinary: Negative for difficulty urinating, hematuria, vaginal bleeding and vaginal discharge.   Musculoskeletal: Negative for myalgias.  Skin: Negative for color change.  Neurological: Negative for dizziness, weakness, light-headedness and headaches.  Hematological: Negative for adenopathy.  Psychiatric/Behavioral: Negative for suicidal ideas.  All other systems reviewed and are negative.      Objective:   Physical Exam  Constitutional: She is oriented to person, place, and time. She appears well-developed and well-nourished. No distress.  HENT:  Head: Normocephalic and atraumatic.  Right Ear: Tympanic membrane normal.  Left Ear: Tympanic membrane normal.  Nose: Nose normal.  Mouth/Throat: Uvula is midline and oropharynx is clear and moist.  Eyes: Pupils are equal, round, and reactive to light. Conjunctivae and EOM are normal. Right eye exhibits no discharge. Left eye exhibits no discharge.  Neck: Neck supple. No thyromegaly present.  Cardiovascular: Normal rate and normal heart sounds.  No murmur heard. During auscultation noted about 10 seconds of increased heart rate, sounded regularly irregular. Converted back to normal rate/rhythm without problem. Pt was aware of the change and denies any associated symptoms.  Pulmonary/Chest: Effort normal and breath sounds normal. No respiratory distress. She has no wheezes. Right breast exhibits no inverted nipple, no mass, no nipple discharge, no skin change and no tenderness. Left breast exhibits no inverted nipple, no mass, no nipple discharge, no skin change and no tenderness.  Abdominal: Soft. Bowel sounds are normal. She exhibits no distension and no mass. There is no tenderness.  Genitourinary: Vagina normal. There is no rash, tenderness or lesion on the right labia. There is no rash, tenderness or lesion on the left labia. Right adnexum  displays no mass and no tenderness.  Genitourinary Comments: Hysterectomy and left oophorectomy   Musculoskeletal: She exhibits no edema or deformity.  Lymphadenopathy:    She has no cervical  adenopathy.  Neurological: She is alert and oriented to person, place, and time. Coordination normal.  Skin: Skin is warm and dry.  Psychiatric: She has a normal mood and affect.  Nursing note and vitals reviewed.     Assessment & Plan:  1. Wellness examination  Adult wellness-complete.wellness physical was conducted today. Importance of diet and exercise were discussed in detail.  In addition to this a discussion regarding safety was also covered. We also reviewed over immunizations and gave recommendations regarding current immunization needed for age.   -UTD on immunizations In addition to this additional areas were also touched on including: Preventative health exams needed:  Colonoscopy: due in 2022 Mammogram: scheduled in December Pap Smear: N/A hysterectomy  Patient was advised yearly wellness exam  2. Hypothyroidism, unspecified type - Plan: TSH  Will continue current dose of levothyroxine, encouraged regular daily administration.  Will recheck TSH in 2-3 months to ensure improvement of TSH with consistent use of medication. F/u in 6 months.  3. Palpitations  Encouraged patient to check her pulse when she is feeling the change in rhythm, monitor for rate and regularity and to notify us if this increases in frequency or duration or if she develops any associated symptoms. Will monitor for now.  4. Hyperlipidemia, unspecified hyperlipidemia type  Doing well on current dose of crestor, will refill. Encouraged to take daily. No adverse effects.  Will stop amitriptyline and HCTZ per patient request. Will monitor her symptoms and if she feels she needs to restart she is to notify us.   Dr. Nicki Reaper was consulted on this visit and is in agreement with the above treatment plan.

## 2017-11-28 DIAGNOSIS — M21621 Bunionette of right foot: Secondary | ICD-10-CM | POA: Diagnosis not present

## 2017-11-28 DIAGNOSIS — M2041 Other hammer toe(s) (acquired), right foot: Secondary | ICD-10-CM | POA: Diagnosis not present

## 2017-12-12 ENCOUNTER — Ambulatory Visit: Payer: BLUE CROSS/BLUE SHIELD

## 2017-12-27 ENCOUNTER — Ambulatory Visit
Admission: RE | Admit: 2017-12-27 | Discharge: 2017-12-27 | Disposition: A | Discharge status: Home / Self Care | Payer: BLUE CROSS/BLUE SHIELD | Source: Ambulatory Visit | Attending: Family Medicine | Admitting: Family Medicine

## 2017-12-27 DIAGNOSIS — M170 Bilateral primary osteoarthritis of knee: Secondary | ICD-10-CM | POA: Diagnosis not present

## 2017-12-27 DIAGNOSIS — Z1231 Encounter for screening mammogram for malignant neoplasm of breast: Secondary | ICD-10-CM

## 2017-12-27 DIAGNOSIS — M25561 Pain in right knee: Secondary | ICD-10-CM | POA: Diagnosis not present

## 2018-03-06 ENCOUNTER — Other Ambulatory Visit: Payer: Self-pay | Admitting: Family Medicine

## 2018-05-02 ENCOUNTER — Ambulatory Visit: Payer: BLUE CROSS/BLUE SHIELD | Admitting: Family Medicine

## 2018-06-06 ENCOUNTER — Telehealth: Payer: Self-pay | Admitting: Family Medicine

## 2018-06-06 DIAGNOSIS — R7301 Impaired fasting glucose: Secondary | ICD-10-CM

## 2018-06-06 DIAGNOSIS — Z79899 Other long term (current) drug therapy: Secondary | ICD-10-CM

## 2018-06-06 DIAGNOSIS — E785 Hyperlipidemia, unspecified: Secondary | ICD-10-CM

## 2018-06-06 DIAGNOSIS — E039 Hypothyroidism, unspecified: Secondary | ICD-10-CM

## 2018-06-06 NOTE — Telephone Encounter (Signed)
Last labs 11/01/17- Lipid, Liver, Met 7, TSH, HIV, and Hep C

## 2018-06-06 NOTE — Telephone Encounter (Signed)
Pt has 6 month follow up scheduled for June 24th and would like to know if Dr. Nicki Reaper recommends her having lab work done for this appt.

## 2018-06-07 NOTE — Telephone Encounter (Signed)
Patient is aware of all. 

## 2018-06-07 NOTE — Telephone Encounter (Signed)
TSH lipid liver glucose Hypothyroidism hyperlipidemia screening for diabetes

## 2018-06-13 ENCOUNTER — Ambulatory Visit: Payer: BLUE CROSS/BLUE SHIELD | Admitting: Family Medicine

## 2018-06-16 DIAGNOSIS — R7301 Impaired fasting glucose: Secondary | ICD-10-CM | POA: Diagnosis not present

## 2018-06-16 DIAGNOSIS — Z79899 Other long term (current) drug therapy: Secondary | ICD-10-CM | POA: Diagnosis not present

## 2018-06-16 DIAGNOSIS — E039 Hypothyroidism, unspecified: Secondary | ICD-10-CM | POA: Diagnosis not present

## 2018-06-16 DIAGNOSIS — E785 Hyperlipidemia, unspecified: Secondary | ICD-10-CM | POA: Diagnosis not present

## 2018-06-17 LAB — LIPID PANEL
Chol/HDL Ratio: 2.5 ratio (ref 0.0–4.4)
Cholesterol, Total: 160 mg/dL (ref 100–199)
HDL: 65 mg/dL (ref >39)
LDL Calculated: 76 mg/dL (ref 0–99)
Triglycerides: 95 mg/dL (ref 0–149)
VLDL Cholesterol Cal: 19 mg/dL (ref 5–40)

## 2018-06-17 LAB — HEPATIC FUNCTION PANEL
ALT: 19 IU/L (ref 0–32)
AST: 22 IU/L (ref 0–40)
Albumin: 4.2 g/dL (ref 3.8–4.8)
Alkaline Phosphatase: 65 IU/L (ref 39–117)
Bilirubin Total: 0.5 mg/dL (ref 0.0–1.2)
Bilirubin, Direct: 0.11 mg/dL (ref 0.00–0.40)
Total Protein: 6 g/dL (ref 6.0–8.5)

## 2018-06-17 LAB — GLUCOSE, RANDOM: Glucose: 88 mg/dL (ref 65–99)

## 2018-06-17 LAB — TSH: TSH: 8.48 u[IU]/mL — ABNORMAL HIGH (ref 0.450–4.500)

## 2018-06-18 ENCOUNTER — Encounter: Payer: Self-pay | Admitting: Family Medicine

## 2018-07-05 ENCOUNTER — Ambulatory Visit (INDEPENDENT_AMBULATORY_CARE_PROVIDER_SITE_OTHER): Payer: BC Managed Care – PPO | Admitting: Family Medicine

## 2018-07-05 ENCOUNTER — Other Ambulatory Visit: Payer: Self-pay

## 2018-07-05 DIAGNOSIS — E039 Hypothyroidism, unspecified: Secondary | ICD-10-CM | POA: Diagnosis not present

## 2018-07-05 DIAGNOSIS — E785 Hyperlipidemia, unspecified: Secondary | ICD-10-CM

## 2018-07-05 DIAGNOSIS — J301 Allergic rhinitis due to pollen: Secondary | ICD-10-CM

## 2018-07-05 DIAGNOSIS — N3941 Urge incontinence: Secondary | ICD-10-CM

## 2018-07-05 DIAGNOSIS — R200 Anesthesia of skin: Secondary | ICD-10-CM | POA: Diagnosis not present

## 2018-07-05 DIAGNOSIS — R252 Cramp and spasm: Secondary | ICD-10-CM

## 2018-07-05 DIAGNOSIS — R5383 Other fatigue: Secondary | ICD-10-CM

## 2018-07-05 HISTORY — DX: Urge incontinence: N39.41

## 2018-07-05 MED ORDER — LEVOTHYROXINE SODIUM 125 MCG PO TABS: ORAL_TABLET | ORAL | 1 refills | Status: DC

## 2018-07-05 MED ORDER — ROSUVASTATIN CALCIUM 10 MG PO TABS: ORAL_TABLET | ORAL | 1 refills | Status: DC

## 2018-07-05 MED ORDER — MIRABEGRON ER 25 MG PO TB24: 25.0000 mg | ORAL_TABLET | Freq: Every day | ORAL | 1 refills | Status: DC

## 2018-07-05 NOTE — Progress Notes (Signed)
Subjective:    Patient ID: Debbie Miranda, female    DOB: Jul 07, 1955, 63 y.o.   MRN: [id number]  Hyperlipidemia This is a chronic problem. The current episode started more than 1 year ago. Pertinent negatives include no chest pain or shortness of breath. Treatments tried: rosuvastatin 10mg . There are no compliance problems (takes meds every day, walks for exercise, eats healthy but has gained weight).    Sinus headache, congestion at night, sore throat. Started about one week ago.  To some degree having allergies with stuffiness postnasal drainage no wheezing no difficulty breathing no vomiting or diarrhea.  No severe sinus pain or mucoid drainage  Spasms in abdomen. Moves around. Upper right or lower left side. Happens a lot in the bathtub. Seems to be with movement. Started about 4 months ago.  Intermittent cramping in abdominal muscles sometimes with movement sometimes out of the blue only lasts a few moments at a time no ongoing abdominal pain no sweats fever chills  Neuropathy in left 2nd toe. Has had for some time.  She has had this for period of time but she states she has older sister has peripheral neuropathy and have any use a walker she is concerned about this but she states that has not progressed and stays in the second toe  Urinary urgency and incontinence. Started about one year ago.  She has frequent times where she feels like she suddenly has to go to the bathroom and if she does not get there she has an accident.  She denies any hematuria.  She does also have some evidence of stress incontinence when she sneezes or coughs she does urinate some  Feels tired all the time.  Significant fatigue tiredness that is been going on for the past several months not sure why it is she feels tired rundown all the time denies any bleeding issues does have thyroid issues.  Virtual Visit via Telephone Note  I connected with Debbie Miranda on 07/05/18 at 10:00 AM EDT by telephone and verified  that I am speaking with the correct person using two identifiers.  Location: Patient: home Provider: office   I discussed the limitations, risks, security and privacy concerns of performing an evaluation and management service by telephone and the availability of in person appointments. I also discussed with the patient that there may be a patient responsible charge related to this service. The patient expressed understanding and agreed to proceed.   History of Present Illness:    Observations/Objective:   Assessment and Plan:   Follow Up Instructions:    I discussed the assessment and treatment plan with the patient. The patient was provided an opportunity to ask questions and all were answered. The patient agreed with the plan and demonstrated an understanding of the instructions.   The patient was advised to call back or seek an in-person evaluation if the symptoms worsen or if the condition fails to improve as anticipated.  I provided 25 minutes of non-face-to-face time during this encounter.       Review of Systems  Constitutional: Negative for activity change, appetite change and fatigue.  HENT: Negative for congestion and rhinorrhea.   Respiratory: Negative for cough and shortness of breath.   Cardiovascular: Negative for chest pain and leg swelling.  Gastrointestinal: Negative for abdominal pain and diarrhea.  Endocrine: Negative for polydipsia and polyphagia.  Skin: Negative for color change.  Neurological: Negative for dizziness and weakness.  Psychiatric/Behavioral: Negative for behavioral problems and confusion.  Objective:   Physical Exam Today's visit was via telephone Physical exam was not possible for this visit        Assessment & Plan:  1. Hypothyroidism, unspecified type Subpar control increase levothyroxine new dose 125 mcg daily recheck TSH in 4 to 6 weeks  2. Hyperlipidemia, unspecified hyperlipidemia type Continue medication watch  diet lab work looks good  3. Numbness of toes She has numbness of the second toe unlikely that this is peripheral neuropathy monitor closely if it progresses nerve conduction study could be due to a Morton's neuroma  4. Seasonal allergic rhinitis due to pollen I recommend over-the-counter allergy medicine such as Allegra Claritin and Flonase should gradually get better  5. Muscle cramps Intermittent muscle cramps in the abdomen could be related to the thyroid but we will go ahead and check potassium and magnesium as well  6. Other fatigue Her other fatigue more than likely related to the thyroid but could also be anemia check CBC  7. Urge incontinence Urge incontinence going on along with stress incontinence will try it Myrbetriq Patient will give [place] feedback how that is doing  Wellness checkup in the fall

## 2018-07-06 NOTE — Progress Notes (Signed)
Orders put in and mailed to pt to do first week in august

## 2018-07-06 NOTE — Addendum Note (Signed)
Addended by: Carmelina Noun on: 07/06/2018 05:09 PM   Modules accepted: Orders

## 2018-07-13 DIAGNOSIS — H2513 Age-related nuclear cataract, bilateral: Secondary | ICD-10-CM | POA: Diagnosis not present

## 2018-07-13 DIAGNOSIS — H5203 Hypermetropia, bilateral: Secondary | ICD-10-CM | POA: Diagnosis not present

## 2018-07-21 DIAGNOSIS — Z20828 Contact with and (suspected) exposure to other viral communicable diseases: Secondary | ICD-10-CM | POA: Diagnosis not present

## 2018-08-03 DIAGNOSIS — M25512 Pain in left shoulder: Secondary | ICD-10-CM | POA: Diagnosis not present

## 2018-08-03 DIAGNOSIS — M25511 Pain in right shoulder: Secondary | ICD-10-CM | POA: Diagnosis not present

## 2018-08-09 ENCOUNTER — Encounter (HOSPITAL_COMMUNITY): Payer: Self-pay | Admitting: Physician Assistant

## 2018-08-09 ENCOUNTER — Telehealth: Payer: Self-pay | Admitting: Internal Medicine

## 2018-08-09 ENCOUNTER — Observation Stay (HOSPITAL_COMMUNITY)
Admission: EM | Admit: 2018-08-09 | Discharge: 2018-08-10 | Disposition: A | Discharge status: Home / Self Care | Payer: BC Managed Care – PPO | Attending: Internal Medicine | Admitting: Internal Medicine

## 2018-08-09 ENCOUNTER — Emergency Department (HOSPITAL_COMMUNITY): Payer: BC Managed Care – PPO

## 2018-08-09 ENCOUNTER — Other Ambulatory Visit: Payer: Self-pay

## 2018-08-09 DIAGNOSIS — I10 Essential (primary) hypertension: Secondary | ICD-10-CM

## 2018-08-09 DIAGNOSIS — E039 Hypothyroidism, unspecified: Secondary | ICD-10-CM | POA: Diagnosis not present

## 2018-08-09 DIAGNOSIS — M1990 Unspecified osteoarthritis, unspecified site: Secondary | ICD-10-CM | POA: Diagnosis not present

## 2018-08-09 DIAGNOSIS — R197 Diarrhea, unspecified: Secondary | ICD-10-CM

## 2018-08-09 DIAGNOSIS — Z79899 Other long term (current) drug therapy: Secondary | ICD-10-CM | POA: Diagnosis not present

## 2018-08-09 DIAGNOSIS — K222 Esophageal obstruction: Secondary | ICD-10-CM | POA: Diagnosis present

## 2018-08-09 DIAGNOSIS — K5730 Diverticulosis of large intestine without perforation or abscess without bleeding: Secondary | ICD-10-CM | POA: Diagnosis not present

## 2018-08-09 DIAGNOSIS — K219 Gastro-esophageal reflux disease without esophagitis: Secondary | ICD-10-CM | POA: Insufficient documentation

## 2018-08-09 DIAGNOSIS — Z1159 Encounter for screening for other viral diseases: Secondary | ICD-10-CM | POA: Insufficient documentation

## 2018-08-09 DIAGNOSIS — Z79890 Hormone replacement therapy (postmenopausal): Secondary | ICD-10-CM | POA: Diagnosis not present

## 2018-08-09 DIAGNOSIS — K55039 Acute (reversible) ischemia of large intestine, extent unspecified: Secondary | ICD-10-CM

## 2018-08-09 DIAGNOSIS — E785 Hyperlipidemia, unspecified: Secondary | ICD-10-CM | POA: Diagnosis not present

## 2018-08-09 DIAGNOSIS — K529 Noninfective gastroenteritis and colitis, unspecified: Principal | ICD-10-CM | POA: Insufficient documentation

## 2018-08-09 DIAGNOSIS — Z8249 Family history of ischemic heart disease and other diseases of the circulatory system: Secondary | ICD-10-CM | POA: Diagnosis not present

## 2018-08-09 DIAGNOSIS — R0600 Dyspnea, unspecified: Secondary | ICD-10-CM

## 2018-08-09 DIAGNOSIS — K922 Gastrointestinal hemorrhage, unspecified: Secondary | ICD-10-CM

## 2018-08-09 DIAGNOSIS — R109 Unspecified abdominal pain: Secondary | ICD-10-CM

## 2018-08-09 DIAGNOSIS — R0609 Other forms of dyspnea: Secondary | ICD-10-CM

## 2018-08-09 DIAGNOSIS — Z03818 Encounter for observation for suspected exposure to other biological agents ruled out: Secondary | ICD-10-CM | POA: Diagnosis not present

## 2018-08-09 DIAGNOSIS — Z86010 Personal history of colonic polyps: Secondary | ICD-10-CM | POA: Diagnosis not present

## 2018-08-09 DIAGNOSIS — Z8719 Personal history of other diseases of the digestive system: Secondary | ICD-10-CM | POA: Diagnosis not present

## 2018-08-09 DIAGNOSIS — Z7982 Long term (current) use of aspirin: Secondary | ICD-10-CM | POA: Insufficient documentation

## 2018-08-09 DIAGNOSIS — K559 Vascular disorder of intestine, unspecified: Secondary | ICD-10-CM | POA: Diagnosis not present

## 2018-08-09 DIAGNOSIS — K625 Hemorrhage of anus and rectum: Secondary | ICD-10-CM

## 2018-08-09 DIAGNOSIS — R935 Abnormal findings on diagnostic imaging of other abdominal regions, including retroperitoneum: Secondary | ICD-10-CM | POA: Diagnosis not present

## 2018-08-09 DIAGNOSIS — E669 Obesity, unspecified: Secondary | ICD-10-CM | POA: Diagnosis not present

## 2018-08-09 HISTORY — DX: Essential (primary) hypertension: I10

## 2018-08-09 HISTORY — DX: Acute (reversible) ischemia of large intestine, extent unspecified: K55.039

## 2018-08-09 LAB — CBC
HCT: 47.2 % — ABNORMAL HIGH (ref 36.0–46.0)
Hemoglobin: 15.9 g/dL — ABNORMAL HIGH (ref 12.0–15.0)
MCH: 31.9 pg (ref 26.0–34.0)
MCHC: 33.7 g/dL (ref 30.0–36.0)
MCV: 94.6 fL (ref 80.0–100.0)
Platelets: 242 10*3/uL (ref 150–400)
RBC: 4.99 MIL/uL (ref 3.87–5.11)
RDW: 13 % (ref 11.5–15.5)
WBC: 13.6 10*3/uL — ABNORMAL HIGH (ref 4.0–10.5)
nRBC: 0 % (ref 0.0–0.2)

## 2018-08-09 LAB — COMPREHENSIVE METABOLIC PANEL WITH GFR
ALT: 20 U/L (ref 0–44)
AST: 18 U/L (ref 15–41)
Albumin: 4 g/dL (ref 3.5–5.0)
Alkaline Phosphatase: 72 U/L (ref 38–126)
Anion gap: 10 (ref 5–15)
BUN: 15 mg/dL (ref 8–23)
CO2: 24 mmol/L (ref 22–32)
Calcium: 9 mg/dL (ref 8.9–10.3)
Chloride: 104 mmol/L (ref 98–111)
Creatinine, Ser: 0.77 mg/dL (ref 0.44–1.00)
GFR calc Af Amer: >60 mL/min
GFR calc non Af Amer: >60 mL/min
Glucose, Bld: 104 mg/dL — ABNORMAL HIGH (ref 70–99)
Potassium: 4 mmol/L (ref 3.5–5.1)
Sodium: 138 mmol/L (ref 135–145)
Total Bilirubin: 0.7 mg/dL (ref 0.3–1.2)
Total Protein: 6.8 g/dL (ref 6.5–8.1)

## 2018-08-09 LAB — TYPE AND SCREEN
ABO/RH(D): A POS
Antibody Screen: NEGATIVE

## 2018-08-09 LAB — ABO/RH: ABO/RH(D): A POS

## 2018-08-09 LAB — POC OCCULT BLOOD, ED: Fecal Occult Bld: POSITIVE — AB

## 2018-08-09 LAB — SARS CORONAVIRUS 2 BY RT PCR (HOSPITAL ORDER, PERFORMED IN ~~LOC~~ HOSPITAL LAB): SARS Coronavirus 2: NEGATIVE

## 2018-08-09 LAB — LACTIC ACID, PLASMA: Lactic Acid, Venous: 0.7 mmol/L (ref 0.5–1.9)

## 2018-08-09 LAB — LIPASE, BLOOD: Lipase: 38 U/L (ref 11–51)

## 2018-08-09 MED ORDER — ONDANSETRON HCL 4 MG/2ML IJ SOLN
4.0000 mg | Freq: Once | INTRAMUSCULAR | Status: AC
  Administered 2018-08-09: 4 mg via INTRAVENOUS
  Filled 2018-08-09: qty 2

## 2018-08-09 MED ORDER — AMOXICILLIN-POT CLAVULANATE 875-125 MG PO TABS
1.0000 | ORAL_TABLET | Freq: Two times a day (BID) | ORAL | Status: DC
  Administered 2018-08-09 – 2018-08-10 (×2): 1 via ORAL
  Filled 2018-08-09 (×2): qty 1

## 2018-08-09 MED ORDER — VITAMIN D 25 MCG (1000 UNIT) PO TABS
1000.0000 [IU] | ORAL_TABLET | Freq: Every day | ORAL | Status: DC
  Administered 2018-08-10: 1000 [IU] via ORAL
  Filled 2018-08-09: qty 1

## 2018-08-09 MED ORDER — SODIUM CHLORIDE 0.9 % BOLUS PEDS: 1000.0000 mL | Freq: Once | INTRAVENOUS | Status: DC

## 2018-08-09 MED ORDER — ONDANSETRON HCL 4 MG/2ML IJ SOLN: 4.0000 mg | Freq: Four times a day (QID) | INTRAMUSCULAR | Status: DC | PRN

## 2018-08-09 MED ORDER — SODIUM CHLORIDE 0.9 % IV SOLN
INTRAVENOUS | Status: AC
  Administered 2018-08-09: 22:00:00 via INTRAVENOUS

## 2018-08-09 MED ORDER — ACETAMINOPHEN 325 MG PO TABS: 650.0000 mg | ORAL_TABLET | Freq: Four times a day (QID) | ORAL | Status: DC | PRN

## 2018-08-09 MED ORDER — ROSUVASTATIN CALCIUM 5 MG PO TABS
10.0000 mg | ORAL_TABLET | Freq: Every day | ORAL | Status: DC
  Administered 2018-08-09: 10 mg via ORAL
  Filled 2018-08-09: qty 2

## 2018-08-09 MED ORDER — BIOTIN 5000 MCG PO CAPS: 5000.0000 ug | ORAL_CAPSULE | Freq: Every day | ORAL | Status: DC

## 2018-08-09 MED ORDER — FENTANYL CITRATE (PF) 100 MCG/2ML IJ SOLN
50.0000 ug | Freq: Once | INTRAMUSCULAR | Status: AC | PRN
  Administered 2018-08-09: 50 ug via INTRAVENOUS
  Filled 2018-08-09: qty 2

## 2018-08-09 MED ORDER — LEVOTHYROXINE SODIUM 25 MCG PO TABS
125.0000 ug | ORAL_TABLET | Freq: Every day | ORAL | Status: DC
  Administered 2018-08-10: 125 ug via ORAL
  Filled 2018-08-09: qty 1

## 2018-08-09 MED ORDER — ADULT MULTIVITAMIN W/MINERALS CH
1.0000 | ORAL_TABLET | Freq: Every day | ORAL | Status: DC
  Administered 2018-08-10: 1 via ORAL
  Filled 2018-08-09: qty 1

## 2018-08-09 MED ORDER — SODIUM CHLORIDE 0.9 % IV SOLN: Freq: Once | INTRAVENOUS | Status: DC

## 2018-08-09 MED ORDER — IOHEXOL 300 MG/ML  SOLN
100.0000 mL | Freq: Once | INTRAMUSCULAR | Status: AC | PRN
  Administered 2018-08-09: 100 mL via INTRAVENOUS

## 2018-08-09 MED ORDER — SODIUM CHLORIDE 0.9 % IV BOLUS: 500.0000 mL | Freq: Once | INTRAVENOUS | Status: DC

## 2018-08-09 MED ORDER — MORPHINE SULFATE (PF) 2 MG/ML IV SOLN: 1.0000 mg | INTRAVENOUS | Status: DC | PRN

## 2018-08-09 MED ORDER — ACETAMINOPHEN 650 MG RE SUPP: 650.0000 mg | Freq: Four times a day (QID) | RECTAL | Status: DC | PRN

## 2018-08-09 MED ORDER — SODIUM CHLORIDE 0.9 % IV BOLUS
500.0000 mL | Freq: Once | INTRAVENOUS | Status: AC
  Administered 2018-08-09: 500 mL via INTRAVENOUS

## 2018-08-09 MED ORDER — PANTOPRAZOLE SODIUM 40 MG PO TBEC
40.0000 mg | DELAYED_RELEASE_TABLET | Freq: Every day | ORAL | Status: DC
  Administered 2018-08-09 – 2018-08-10 (×2): 40 mg via ORAL
  Filled 2018-08-09 (×2): qty 1

## 2018-08-09 MED ORDER — MIRABEGRON ER 25 MG PO TB24
25.0000 mg | ORAL_TABLET | Freq: Every day | ORAL | Status: DC
  Administered 2018-08-09 – 2018-08-10 (×2): 25 mg via ORAL
  Filled 2018-08-09 (×2): qty 1

## 2018-08-09 MED ORDER — SODIUM CHLORIDE 0.9 % IV BOLUS
1000.0000 mL | Freq: Once | INTRAVENOUS | Status: AC
  Administered 2018-08-09: 1000 mL via INTRAVENOUS

## 2018-08-09 NOTE — Progress Notes (Signed)
New Admission Note:  Arrival Method: Via wheelchair from ED Mental Orientation: Alert & Oriented x4 Telemetry: CCMD verified Assessment: Completed Skin: Refer to flowsheet IV: Right AC Pain: 5/10 Safety Measures: Safety Fall Prevention Plan discussed with patient. Admission: Completed [facility] [place] Orientation: Patient has been orientated to the room, unit and the staff.  Orders have been reviewed and are being implemented. Will continue to monitor the patient. Call light has been placed within reach.   Vassie Moselle, RN  Phone Number: [phone]

## 2018-08-09 NOTE — ED Notes (Signed)
[place] sandwich bag provided to patient prior to ED departure-Monique,RN

## 2018-08-09 NOTE — Progress Notes (Signed)
PHARMACIST - PHYSICIAN ORDER COMMUNICATION  CONCERNING: P&T Medication Policy on Herbal Medications  DESCRIPTION:  This patient's order for:  Biotin  has been noted.  This product(s) is classified as an "herbal" or natural product. Due to a lack of definitive safety studies or FDA approval, nonstandard manufacturing practices, plus the potential risk of unknown drug-drug interactions while on inpatient medications, the Pharmacy and Therapeutics Committee does not permit the use of "herbal" or natural products of this type within [facility].   ACTION TAKEN: The pharmacy department is unable to verify this order at this time. Please reevaluate patient's clinical condition at discharge and address if the herbal or natural product(s) should be resumed at that time.   Arty Baumgartner, [place] Pager: [phone] or phone: [phone] 08/09/2018 9:36 PM

## 2018-08-09 NOTE — ED Triage Notes (Signed)
Pt reports onset yesterday of lower abd pain with diarrhea, nausea and followed by heavy rectal bleeding.

## 2018-08-09 NOTE — Plan of Care (Signed)
  Problem: Nutrition: Goal: Adequate nutrition will be maintained Outcome: Progressing   

## 2018-08-09 NOTE — Telephone Encounter (Signed)
Patient reports that she had severe abdominal pain that started last night, then she had diarrhea for several hours, now she is passing large amounts of blood.  . She does report that the bleeding has slowed some but still having bloody BM.  Her voice is strained and reports continued pain. She is advised that she needs to proceed to the ED immediately for evaluationn.  She is advised that she should not drive herself. She verbalized understanding.

## 2018-08-09 NOTE — H&P (Signed)
History and Physical    Debbie Miranda:[id number] DOB: 10/12/1955 DOA: 08/09/2018  PCP: Kathyrn Drown, MD Patient coming from: Home  Chief Complaint: Abdominal pain, rectal bleeding  HPI: Debbie Miranda is a 63 y.o. female with medical history significant of arthritis, diverticulosis, diverticulitis, esophageal reflux, hypertension, hyperlipidemia, hypothyroidism presenting to the hospital for evaluation of abdominal pain and rectal bleeding.  Patient states since around 11 PM last night she has been having sharp, intermittent, severe left upper quadrant abdominal pain.  She has been feeling nauseous but has not vomited.  Last night she started having diarrhea soon after her abdominal pain started, then later it turned into just bright red blood per rectum.  She has continued to have several episodes of bright red blood per rectum throughout the day today.  The bouts of her abdominal pain have decreased in frequency today.  Denies any fevers or chills.  Denies any history of GI bleed.  She takes a baby aspirin daily but no other blood thinners.  Denies any history of A. fib.  No other complaints.  ED Course: Blood pressure slightly elevated, remainder of vitals stable.  Bright red blood per rectum noted on exam.  Hemoccult positive.  White count 13.6.  Hemoglobin 15.9.  Lipase and LFTs normal.  COVID-19 rapid test negative.  CT abdomen pelvis showing bowel thickening extending from the splenic flexure to the descending colon measuring approximately 17 cm in length.  Surrounding mesenteric fat stranding changes are seen surrounding this portion of the colon.  No pericolonic loculated fluid collection or free air.  Patient was seen by Dr. Rush Landmark from GI who has reviewed the CT scan and feels the changes are consistent with ischemic colitis.  His recommendation is to give a 5-day course of Augmentin 875 mg twice daily for prophylaxis against translocation of gut bacteria.  If patient has further  bouts of true diarrhea, then should send for stool culture/O&P/C. difficile. Patient received Zofran, Augmentin, 1.5 L fluid bolus, and fentanyl in the ED.  Review of Systems:  All systems reviewed and apart from history of presenting illness, are negative.  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 pylori test     Past Surgical History:  Procedure Laterality Date   ABDOMINAL HYSTERECTOMY     APPENDECTOMY     COLONOSCOPY     ESOPHAGOGASTRODUODENOSCOPY     EXCISION HAGLUND'S DEFORMITY WITH ACHILLES TENDON REPAIR Right 06/07/2013   Procedure: RIGHT ACHILLES DEBRIDEMENT; MCNOBSJ EXCISION;  Surgeon: Wylene Simmer, MD;  Location: [place];  Service: Orthopedics;  Laterality: Right;   EXCISION OF SKIN TAG  11/18/2011   Procedure: EXCISION OF SKIN TAG;  Surgeon: Leighton Ruff, MD;  Location: [facility];  Service: General;  Laterality: N/A;  posterior   GASTROCNEMIUS RECESSION Right 06/07/2013   Procedure: GASTROCNEMIUS  RECESSION;  Surgeon: Wylene Simmer, MD;  Location: [facility];  Service: Orthopedics;  Laterality: Right;   HEMORRHOID SURGERY  11/18/2011   Procedure: HEMORRHOIDECTOMY;  Surgeon: Leighton Ruff, MD;  Location: [facility];  Service: General;;   LEFT OOPHORECTOMY     LUNG LOBECTOMY  12/2011   [place] removed for  carcinoid   PLANTAR FASCIA SURGERY     rectal fissure repair     THYROID SURGERY     nodule and partial thyroidectomy   TONSILLECTOMY       reports that she has never smoked. She has never used smokeless tobacco. She reports current alcohol use. She reports that she does not use drugs.  Allergies  Allergen Reactions   Codeine Nausea And Vomiting    Family History  Problem Relation Age of Onset   Kidney cancer Father    Breast cancer Maternal Grandmother    Cancer Maternal Grandmother        breast   Heart disease Maternal Grandfather    Colon cancer Neg Hx     Prior to Admission medications   Medication Sig Start Date End Date Taking? Authorizing Provider  aspirin 81 MG tablet Take 81 mg by mouth daily.    [provider]  BIOTIN 5000 PO Take 1 capsule by mouth.    [provider]  cholecalciferol (VITAMIN D) 1000 UNITS tablet Take 1,000 Units by mouth daily.    [provider]  levothyroxine (SYNTHROID) 125 MCG tablet 1 q am 07/05/18   Kathyrn Drown, MD  mirabegron ER (MYRBETRIQ) 25 MG TB24 tablet Take 1 tablet (25 mg total) by mouth daily. 07/05/18   Kathyrn Drown, MD  Multiple Vitamins-Minerals (CENTRUM SILVER) tablet Take 1 tablet by mouth daily.    [provider]  PREVIDENT 5000 SENSITIVE 1.1-5 % PSTE  05/05/15   [provider]  rosuvastatin (CRESTOR) 10 MG tablet TAKE ONE TABLET BY MOUTH EVERY DAY. 07/05/18   Kathyrn Drown, MD    Physical Exam: Vitals:   08/09/18 1400 08/09/18 1415 08/09/18 1559 08/09/18 1837  BP: (!) 160/84 (!) 142/82 (!) 148/64 (!) 159/84  Pulse: 64 61 78 71  Resp:   18 18  Temp:      TempSrc:      SpO2: 97% 96% 98% 97%    Physical Exam  Constitutional: She is oriented to person, place, and time. She appears well-developed and well-nourished. No distress.  HENT:  Head: Normocephalic.  Mouth/Throat: Oropharynx is clear and moist.  Eyes: Right eye exhibits no discharge. Left eye  exhibits no discharge.  Neck: Neck supple.  Cardiovascular: Normal rate, regular rhythm and intact distal pulses.  Pulmonary/Chest: Effort normal and breath sounds normal. No respiratory distress. She has no wheezes. She has no rales.  Abdominal: Soft. Bowel sounds are normal. She exhibits no distension. There is abdominal tenderness. There is no rebound and no guarding.  Left upper quadrant mildly tender to palpation  Musculoskeletal:        General: No edema.  Neurological: She is alert and oriented to person, place, and time.  Skin: Skin is warm and dry. She is not diaphoretic.     Labs on Admission: I have personally reviewed following labs and imaging studies  CBC: Recent Labs  Lab 08/09/18 1103  WBC 13.6*  HGB 15.9*  HCT 47.2*  MCV 94.6  PLT 017   Basic Metabolic Panel: Recent Labs  Lab 08/09/18 1103  NA  138  K 4.0  CL 104  CO2 24  GLUCOSE 104*  BUN 15  CREATININE 0.77  CALCIUM 9.0   GFR: CrCl cannot be calculated (Unknown ideal weight.). Liver Function Tests: Recent Labs  Lab 08/09/18 1103  AST 18  ALT 20  ALKPHOS 72  BILITOT 0.7  PROT 6.8  ALBUMIN 4.0   Recent Labs  Lab 08/09/18 1540  LIPASE 38   No results for input(s): AMMONIA in the last 168 hours. Coagulation Profile: No results for input(s): INR, PROTIME in the last 168 hours. Cardiac Enzymes: No results for input(s): CKTOTAL, CKMB, CKMBINDEX, TROPONINI in the last 168 hours. BNP (last 3 results) No results for input(s): PROBNP in the last 8760 hours. HbA1C: No results for input(s): HGBA1C in the last 72 hours. CBG: No results for input(s): GLUCAP in the last 168 hours. Lipid Profile: No results for input(s): CHOL, HDL, LDLCALC, TRIG, CHOLHDL, LDLDIRECT in the last 72 hours. Thyroid Function Tests: No results for input(s): TSH, T4TOTAL, FREET4, T3FREE, THYROIDAB in the last 72 hours. Anemia Panel: No results for input(s): VITAMINB12, FOLATE, FERRITIN, TIBC, IRON, RETICCTPCT in the  last 72 hours. Urine analysis: No results found for: COLORURINE, APPEARANCEUR, LABSPEC, [place], GLUCOSEU, HGBUR, BILIRUBINUR, KETONESUR, PROTEINUR, UROBILINOGEN, NITRITE, LEUKOCYTESUR  Radiological Exams on Admission: Ct Abdomen Pelvis W Contrast  Result Date: 08/09/2018 CLINICAL DATA:  GI bleed abdominal pain, colitis left abdomen EXAM: CT ABDOMEN AND PELVIS WITH CONTRAST TECHNIQUE: Multidetector CT imaging of the abdomen and pelvis was performed using the standard protocol following bolus administration of intravenous contrast. CONTRAST:  162mL OMNIPAQUE IOHEXOL 300 MG/ML  SOLN COMPARISON:  August 10, 2010 FINDINGS: Lower chest: There is trace pericardial thickening/fluid. The heart is normal in size. No hiatal hernia. The visualized portions of the lungs are clear. Hepatobiliary: Again noted in the left liver lobe a 8 mm fat containing lesion as on the prior exam. The remainder of the liver parenchyma is unremarkable.The main portal vein is patent. No evidence of calcified gallstones, gallbladder wall thickening or biliary dilatation. Pancreas: Unremarkable. No pancreatic ductal dilatation or surrounding inflammatory changes. Spleen: Normal in size without focal abnormality. Adrenals/Urinary Tract: Both adrenal glands appear normal. There is a 1 cm low-density lesion seen in the lower of the right kidney, likely simple renal cysts. Stomach/Bowel: The stomach is normal appearance. The small bowel is unremarkable without evidence of obstruction or bowel wall thickening. There is bowel thickening seen extending from the splenic flexure to the descending colon measuring approximately 17 cm in length. Surrounding mesenteric fat stranding changes are seen surrounding this portion of the colon. No pericolonic loculated fluid collection or free air. There are scattered sigmoid colonic diverticula. The appendix is normal appearance. Vascular/Lymphatic: There are no enlarged mesenteric, retroperitoneal, or pelvic  lymph nodes. No significant vascular findings are present. Reproductive: The patient is status post hysterectomy. No adnexal masses or collections seen. Other: No evidence of abdominal wall mass or hernia. Musculoskeletal: No acute or significant osseous findings. IMPRESSION: 1. Splenic flexure and descending colitis, this could be due to infectious or inflammatory process. No evidence of pericolonic abscess or free air. 2. Unchanged probable tiny lipoma in the left liver  lobe 3. Trace pericardial thickening/effusion Electronically Signed   By: Prudencio Pair M.D.   On: 08/09/2018 16:02    Assessment/Plan Principal Problem:   Ischemic colitis ([place]) Active Problems:   GERD (gastroesophageal reflux disease)   Hypothyroidism   Hyperlipidemia   HTN (hypertension)   Ischemic colitis Hemodynamically stable.  Bright red blood per rectum noted on exam.  Hemoccult positive.  Hemoglobin reassuring at 15.9.  White count 13.6.  CT abdomen pelvis showing bowel thickening extending from the splenic flexure to the descending colon measuring approximately 17 cm in length.  Surrounding mesenteric fat stranding changes are seen surrounding this portion of the colon.  No pericolonic loculated fluid collection or free air.   -Patient was seen by Dr. Rush Landmark from GI who has reviewed the CT scan and feels the changes are consistent with ischemic colitis.  He is recommending Augmentin 875 mg twice daily for prophylaxis against translocation of gut bacteria. -If patient has further bouts of true diarrhea, then should send for stool culture/O&P/C. Difficile. -IV fluid hydration -Diet okay per GI -Protonix 40 mg daily -Morphine PRN pain -Zofran PRN nausea -Type and screen -GI following; appreciate recommendations -Continue to monitor CBC -Check lactic acid level  Hypertension Blood pressure slightly elevated with systolic currently in the 140s to 150s. -Continue to monitor, avoid giving antihypertensives given  current GI bleed  Hypothyroidism -Continue home Synthroid  GERD -PPI  Hyperlipidemia -Continue Crestor  DVT prophylaxis: SCDs Code Status: Patient wishes to be full code. Family Communication: Husband at bedside. Disposition Plan: Anticipate discharge after clinical improvement. Consults called: GI Admission status: It is my clinical opinion that referral for OBSERVATION is reasonable and necessary in this patient based on the above information provided. The aforementioned taken together are felt to place the patient at high risk for further clinical deterioration. However it is anticipated that the patient may be medically stable for discharge from the hospital within 24 to 48 hours.  The medical decision making on this patient was of high complexity and the patient is at high risk for clinical deterioration, therefore this is a level 3 visit.  Shela Leff MD [facility] Pager [phone]  If 7PM-7AM, please contact night-coverage [email or web address] Password [email or web address]  08/09/2018, 9:01 PM

## 2018-08-09 NOTE — Consult Note (Signed)
[facility] Consult: 12:17 PM 08/09/2018  LOS: 0 days    Referring Provider: Dr Kathrynn Humble  Primary Care Physician:  Kathyrn Drown, MD Primary Gastroenterologist:  Dr. Carlean Purl    Reason for Consultation: Painless hematochezia.   HPI: Debbie Miranda is a 63 y.o. female.  Hx Carcinoid of lung LLL, s/p resection. Intermittent diarrhea.  IBS.  Diverticulitis 07/2010.  Obesity.  Hypothyroidism.  Hyperlipidemia.  2007 Colonoscopy.  Dr Laural Golden. For change bowel habits.  Small, proximal sigmoid, sessile polyp (hyperplastic).  2 small anal papillae.  External hemorrhoids.  Suspect constipation is either idiopathic (colonic dysmotility) or due to dietary changes. 11/2008 EGD.  Dr Carlean Purl for eval epigastric pain.  Distal esophageal erosions consistent with reflux.  Antral gastritis.  Pathology: minor, chronic gastric inflammation. No H. pylori. 07/2015 Colonoscopy.  Average risk screening study, Dr. Carlean Purl.  6 mm, sessile polyp (SSA) resected from ascending colon.  Sigmoid diverticulosis.  Recall colonoscopy due 07/2020.     Patient has been having normal, brown, formed stools daily.  No GI distress.  Had some mostly solid food dysphagia for a few weeks at the level of the upper esophageal sphincter but no regurgitation.  She was asleep and awakened at about 11 PM last night with an urge to defecate and LLQ pain.  She had several brown, diarrheal stools.  After that the stools became purely bloody and she has had at least 3 or 4 large volume episodes and may be up to 20 smaller episodes.  There is some nausea but she has not vomited.  Pain persists, bloody diarrhea persists.  No fever or chills.  The only recent medication changes for her are Myrbetriq which was started about a month ago for irritable bladder.  She takes at most  a couple of Aleve maybe once a week.  Rarely drinks alcohol.  Denies other episodes of bloody stool and denies any excessive or unusual bleeding/bruising.  No history of anemia or transfusions.  Patient has been not been taking any PPI or H2 blockers, and frequently has limited heartburn.   She received 50 mg of fentanyl at 1230, about an hour ago and pain as well as tenderness to palpation has improved.     Hgb 15.9, MCV 94.  WBCs 13.6. Platelets normal.  BUN normal.  LFTs normal. FOBT positive.    Initial presenting BP 168/101, heart rate 89.  No fever.     Past Medical History:  Diagnosis Date  . Arthritis   . Cancer [facility]) 2014   carcinoid lung tumor  . 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 Contact lens/glasses fitting    wares contacts or glasses  . Costochondritis   . Diverticulitis   . Esophageal reflux   . External hemorrhoids without mention of complication   . Headache(784.0)   . Hyperlipidemia   . Hypertension    pt denies  . Hypothyroidism   . IFG (impaired fasting glucose)   . Mycotic toenails 12/10/2010   1st toenail right-lasered  nail 12/10/2010-present (last on 06/15/2012)  . Obesity   . Personal history of colonic polyps 07/30/2015  . PONV (postoperative nausea and vomiting)   . Positive H. pylori test     Past Surgical History:  Procedure Laterality Date  . ABDOMINAL HYSTERECTOMY    . APPENDECTOMY    . COLONOSCOPY    . ESOPHAGOGASTRODUODENOSCOPY    . EXCISION HAGLUND'S DEFORMITY WITH ACHILLES TENDON REPAIR Right 06/07/2013   Procedure: RIGHT ACHILLES DEBRIDEMENT; [id number] EXCISION;  Surgeon: Wylene Simmer, MD;  Location: [place];  Service: Orthopedics;  Laterality: Right;  . EXCISION OF SKIN TAG  11/18/2011   Procedure: EXCISION OF SKIN TAG;  Surgeon: Leighton Ruff, MD;  Location: [place];  Service: General;  Laterality: N/A;  posterior  . GASTROCNEMIUS RECESSION Right 06/07/2013    Procedure: GASTROCNEMIUS RECESSION;  Surgeon: Wylene Simmer, MD;  Location: [place];  Service: Orthopedics;  Laterality: Right;  . HEMORRHOID SURGERY  11/18/2011   Procedure: HEMORRHOIDECTOMY;  Surgeon: Leighton Ruff, MD;  Location: [facility];  Service: General;;  . LEFT OOPHORECTOMY    . LUNG LOBECTOMY  12/2011   [place] removed for carcinoid  . PLANTAR FASCIA SURGERY    . rectal fissure repair    . THYROID SURGERY     nodule and partial thyroidectomy  . TONSILLECTOMY      Prior to Admission medications   Medication Sig Start Date End Date Taking? Authorizing Provider  aspirin 81 MG tablet Take 81 mg by mouth daily.    [provider]  BIOTIN 5000 PO Take 1 capsule by mouth.    [provider]  cholecalciferol (VITAMIN D) 1000 UNITS tablet Take 1,000 Units by mouth daily.    [provider]  levothyroxine (SYNTHROID) 125 MCG tablet 1 q am 07/05/18   Kathyrn Drown, MD  mirabegron ER (MYRBETRIQ) 25 MG TB24 tablet Take 1 tablet (25 mg total) by mouth daily. 07/05/18   Kathyrn Drown, MD  Multiple Vitamins-Minerals (CENTRUM SILVER) tablet Take 1 tablet by mouth daily.    [provider]  PREVIDENT 5000 SENSITIVE 1.1-5 % PSTE  05/05/15   [provider]  rosuvastatin (CRESTOR) 10 MG tablet TAKE ONE TABLET BY MOUTH EVERY DAY. 07/05/18   Kathyrn Drown, MD    Scheduled Meds: . ondansetron (ZOFRAN) IV  4 mg Intravenous Once   Infusions: . sodium chloride     Followed by  . sodium chloride     PRN Meds: fentaNYL (SUBLIMAZE) injection   Allergies as of 08/09/2018 - Review Complete 08/09/2018  Allergen Reaction Noted  . Codeine Nausea And Vomiting     Family History  Problem Relation Age of Onset  . Kidney cancer Father   . Breast cancer Maternal Grandmother   . Cancer Maternal Grandmother        breast  . Heart disease Maternal Grandfather   . Colon cancer Neg Hx     Social History    Socioeconomic History  . Marital status: Married    Spouse name: Not on file  . Number of children: 1  . Years of education: Not on file  . Highest education level: Not on file  Occupational History  . Occupation: homemaker  Social Needs  . Financial resource strain: Not on file  . Food insecurity    Worry: Not on file    Inability: Not on file  . Transportation needs    Medical: Not  on file    Non-medical: Not on file  Tobacco Use  . Smoking status: Never Smoker  . Smokeless tobacco: Never Used  Substance and Sexual Activity  . Alcohol use: Yes    Alcohol/week: 0.0 standard drinks    Comment: wine-occ  . Drug use: No  . Sexual activity: Yes    Birth control/protection: Surgical  Lifestyle  . Physical activity    Days per week: Not on file    Minutes per session: Not on file  . Stress: Not on file  Relationships  . Social Herbalist on phone: Not on file    Gets together: Not on file    Attends religious service: Not on file    Active member of club or organization: Not on file    Attends meetings of clubs or organizations: Not on file    Relationship status: Not on file  . Intimate partner violence    Fear of current or ex partner: Not on file    Emotionally abused: Not on file    Physically abused: Not on file    Forced sexual activity: Not on file  Other Topics Concern  . Not on file  Social History Narrative  . Not on file    REVIEW OF SYSTEMS: Constitutional: Patient has been feeling fatigued, sluggish which she attributed to not doing much physically. ENT:  No nose bleeds Pulm: Some, non-profound, dyspnea on exertion.  No cough. CV:  No palpitations, no LE edema.  No chest pain GU:  No hematuria, no frequency GI: See HPI. Heme: See HPI. Transfusions: None ever. Neuro:  No headaches, no peripheral tingling or numbness.  No dizziness, no syncope. Derm:  No itching, no rash or sores.  Endocrine:  No sweats or chills.  No polyuria or dysuria  Immunization: Exudation history reviewed and multiple vaccines are up-to-date. Travel:  None beyond local counties in last few months.    PHYSICAL EXAM: Vital signs in last 24 hours: Vitals:   08/09/18 1034  BP: (!) 168/101  Pulse: 89  Resp: 16  Temp: 98.4 F (36.9 C)  SpO2: 96%   Wt Readings from Last 3 Encounters:  11/01/17 90.7 kg  08/16/16 80.9 kg  06/30/16 84.8 kg    General: Pleasant, well-appearing, comfortable Head: No facial asymmetry or swelling. Eyes: No conjunctival pallor.  EOMI. Ears: Not hard of hearing Nose: No discharge, no congestion Mouth: Good dentition.  Tongue midline.  Oral mucosa pink, moist, clear. Neck: No JVD, no thyromegaly, no masses. Lungs: Good breath sounds bilaterally.  No labored breathing or cough. Heart: RRR.  No MRG.  S1, S2 present. Abdomen: Soft.  Tenderness without guarding or rebound in the left upper mid and lower abdomen.  Bowel sounds scant but not tinkling or tympanitic.Marland Kitchen   Rectal: Did not perform.  Right red blood on DRE per ED PA Ward Musc/Skeltl: No joint redness, swelling or gross deformity. Extremities: No CCE. Neurologic: Oriented x3.  Good historian.  No tremors, no limb weakness. Skin: No rash, no sores, no purpura/bruising. Tattoos: None Nodes: No cervical adenopathy. Psych: Calm, pleasant, cooperative.  Intake/Output from previous day: No intake/output data recorded. Intake/Output this shift: No intake/output data recorded.  LAB RESULTS: Recent Labs    08/09/18 1103  WBC 13.6*  HGB 15.9*  HCT 47.2*  PLT 242   BMET Lab Results  Component Value Date   NA 141 10/28/2017   NA 142 06/04/2016   NA 139 05/16/2015   K  4.0 10/28/2017   K 4.6 06/04/2016   K 4.1 05/16/2015   CL 100 10/28/2017   CL 104 06/04/2016   CL 98 05/16/2015   CO2 26 10/28/2017   CO2 26 06/04/2016   CO2 25 05/16/2015   GLUCOSE 88 06/16/2018   GLUCOSE 84 10/28/2017   GLUCOSE 86 06/04/2016   BUN 13 10/28/2017   BUN 10 06/04/2016    BUN 13 05/16/2015   CREATININE 0.74 10/28/2017   CREATININE 0.75 06/04/2016   CREATININE 0.69 05/16/2015   CALCIUM 9.3 10/28/2017   CALCIUM 9.2 06/04/2016   CALCIUM 8.9 05/16/2015     RADIOLOGY STUDIES: No results found.     IMPRESSION:   *    Left abdominal pain, hematochezia lowing several episodes of nonbloody stool at the start..  Hemodynamically stable. Suspect colitis, ischemic specifically. Previous colonoscopies showing hyperplastic and sessile serrated adenomatous colon polyps, sigmoid diverticulosis, hemorrhoids.  Low suspicion that this is diverticulitis  *    Recent occurrences of dysphagia in patient with history of GERD and not taking PPI.     PLAN:     *     Discussed case with Dr. Rush Landmark.  Ordering CTAP with oral and IV contrast to assess for colitis.  No plans for colonoscopy at present.   Azucena Freed  08/09/2018, 12:17 PM Phone [phone]

## 2018-08-09 NOTE — Progress Notes (Addendum)
CT looks like this is an ischemic colitis; lengthy segment involving splenic flexure and descending colon.   D/w Dr Jerilynn Mages Adding 5 days of Augmentin 875 BID for prophylaxis against translocation of gut bacteria.   If patient has further bouts of true diarrhea, then should send for Stool culture/O&P/C. Difficile, though most likely consistent with Ischemic colitis.  Azucena Freed PA-C  Justice Britain, MD [facility] Advanced [facility]oscopy Office # [phone]

## 2018-08-09 NOTE — ED Provider Notes (Signed)
[facility] EMERGENCY DEPARTMENT Provider Note   CSN: [id number] Arrival date & time: 08/09/18  1032    History   Chief Complaint Chief Complaint  Patient presents with   Abdominal Pain    HPI Debbie Miranda is a 63 y.o. female.     The history is provided by the patient and medical records. No language interpreter was used.  Abdominal Pain Associated symptoms: diarrhea and nausea   Associated symptoms: no vomiting      Debbie Miranda is a 63 y.o. female  with a PMH as listed below who presents to the Emergency Department complaining of rectal bleeding.  Patient states that at 11 PM last night, she had acute onset of generalized abdominal pain, but hurting most significantly to the left lower quadrant.  She went to the restroom and had 5 episodes of loose, nonbloody stool initially.  On the sixth episode, she wiped and noticed blood on the toilet paper.  Few minutes later, she felt like she had another bowel movement, but when she went to use the bathroom, nothing but blood came out.  She reports it was like turning on the kitchen sink and red blood just poured out.  This occurred 3 times.  She feels as if it is the bleeding is started to slow down, but if she sits on the toilet and bears down, even with no bowel movement, she still will have some blood come out.  She did notice a few clots intermittently throughout this as well.  Her abdominal pain is much improved, but does still have some discomfort to the left lower quadrant.  Associated with nausea, but no vomiting.  No lightheadedness, dizziness or syncope.  She sees Dr. Carlean Purl with lower GI, but unsure exactly what for.  Believes it is mostly just routine care such as colonoscopies.  She states that she is on the 5-year colonoscopy plan and not due to go back for another year and a half or 2, so she knows that she is up-to-date on this.  She is unsure what recent colonoscopies have shown.  She is not on any  anticoagulation.  She rarely takes NSAIDs.  Not much of a drinker.  She does report having intermittent heartburn for the last 2 or 3 weeks.  She did not think much about it, but now looking back, does seem to have been bothering her a little more so than usual.  She did not take any medications prior to arrival for her symptoms.  She called GI this morning who recommended that she come to the emergency department for evaluation.   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 pylori test     Patient Active Problem List   Diagnosis Date Noted   Ischemic colitis ([place]) 08/09/2018   Urge incontinence 07/05/2018   Neuropathic  pain 06/30/2016   Venous insufficiency of left leg 03/29/2016   Personal history of colonic polyps 07/30/2015   Hyperlipidemia 07/10/2013   Mycotic toenails    Corn of toe 5th right 08/16/2012   Carcinoid tumor of lung 05/16/2012   Impaired fasting glucose 05/16/2012   Thyroid nodule 05/16/2012   GERD (gastroesophageal reflux disease) 05/16/2012   Hypothyroidism 05/16/2012   Anal itching 10/27/2011   EXTERNAL HEMORRHOIDS 10/23/2008    Past Surgical History:  Procedure Laterality Date   ABDOMINAL HYSTERECTOMY     APPENDECTOMY     COLONOSCOPY     ESOPHAGOGASTRODUODENOSCOPY     EXCISION HAGLUND'S DEFORMITY WITH ACHILLES TENDON REPAIR Right 06/07/2013   Procedure:  RIGHT ACHILLES DEBRIDEMENT; HAGLUND EXCISION;  Surgeon: Wylene Simmer, MD;  Location: [place];  Service: Orthopedics;  Laterality: Right;   EXCISION OF SKIN TAG  11/18/2011   Procedure: EXCISION OF SKIN TAG;  Surgeon: Leighton Ruff, MD;  Location: [facility];  Service: General;  Laterality: N/A;  posterior   GASTROCNEMIUS RECESSION Right 06/07/2013   Procedure: GASTROCNEMIUS RECESSION;  Surgeon: Wylene Simmer, MD;  Location: [place];  Service: Orthopedics;  Laterality: Right;   HEMORRHOID SURGERY  11/18/2011   Procedure: HEMORRHOIDECTOMY;  Surgeon: Leighton Ruff, MD;  Location: [facility];  Service: General;;   LEFT OOPHORECTOMY     LUNG LOBECTOMY  12/2011   [place] removed for carcinoid   PLANTAR FASCIA SURGERY     rectal fissure repair     THYROID SURGERY     nodule and partial thyroidectomy   TONSILLECTOMY       OB History   No obstetric history on file.      Home Medications    Prior to Admission medications   Medication Sig Start Date End Date Taking? Authorizing Provider  aspirin 81 MG tablet Take 81 mg by mouth daily.    [provider]  BIOTIN 5000 PO Take 1 capsule by mouth.    [provider]  cholecalciferol (VITAMIN D) 1000 UNITS tablet Take 1,000 Units by mouth daily.    [provider]  levothyroxine (SYNTHROID) 125 MCG tablet 1 q am 07/05/18   Kathyrn Drown, MD  mirabegron ER (MYRBETRIQ) 25 MG TB24 tablet Take 1 tablet (25 mg total) by mouth daily. 07/05/18   Kathyrn Drown, MD  Multiple Vitamins-Minerals (CENTRUM SILVER) tablet Take 1 tablet by mouth daily.    [provider]  PREVIDENT 5000 SENSITIVE 1.1-5 % PSTE  05/05/15   [provider]  rosuvastatin (CRESTOR) 10 MG tablet TAKE ONE TABLET BY MOUTH EVERY DAY. 07/05/18   Kathyrn Drown, MD    Family History Family History  Problem Relation Age of Onset   Kidney cancer Father     Breast cancer Maternal Grandmother    Cancer Maternal Grandmother        breast   Heart disease Maternal Grandfather    Colon cancer Neg Hx     Social History Social History   Tobacco Use   Smoking status: Never Smoker   Smokeless tobacco: Never Used  Substance Use Topics   Alcohol use: Yes    Alcohol/week: 0.0 standard drinks    Comment: wine-occ   Drug use: No     Allergies   Codeine   Review of Systems Review of Systems  Gastrointestinal: Positive for abdominal pain, anal bleeding, blood in stool, diarrhea and nausea. Negative for vomiting.  All other  systems reviewed and are negative.    Physical Exam Updated Vital Signs BP (!) 159/84    Pulse 71    Temp 98.4 F (36.9 C) (Oral)    Resp 18    SpO2 97%   Physical Exam Vitals signs and nursing note reviewed.  Constitutional:      General: She is not in acute distress.    Appearance: She is well-developed.  HENT:     Head: Normocephalic and atraumatic.  Neck:     Musculoskeletal: Neck supple.  Cardiovascular:     Rate and Rhythm: Normal rate and regular rhythm.     Heart sounds: Normal heart sounds. No murmur.  Pulmonary:     Effort: Pulmonary effort is normal. No respiratory distress.     Breath sounds: Normal breath sounds.  Abdominal:     General: There is no distension.     Palpations: Abdomen is soft.    Genitourinary:    Comments: Bright red blood on rectal exam. Non-tender. Skin:    General: Skin is warm and dry.  Neurological:     Mental Status: She is alert and oriented to person, place, and time.      ED Treatments / Results  Labs (all labs ordered are listed, but only abnormal results are displayed) Labs Reviewed  COMPREHENSIVE METABOLIC PANEL - Abnormal; Notable for the following components:      Result Value   Glucose, Bld 104 (*)    All other components within normal limits  CBC - Abnormal; Notable for the following components:   WBC 13.6 (*)    Hemoglobin 15.9 (*)     HCT 47.2 (*)    All other components within normal limits  POC OCCULT BLOOD, ED - Abnormal; Notable for the following components:   Fecal Occult Bld POSITIVE (*)    All other components within normal limits  SARS CORONAVIRUS 2 (HOSPITAL ORDER, [facility])  LIPASE, BLOOD  CBC  TYPE AND SCREEN  ABO/RH    EKG None  Radiology Ct Abdomen Pelvis W Contrast  Result Date: 08/09/2018 CLINICAL DATA:  GI bleed abdominal pain, colitis left abdomen EXAM: CT ABDOMEN AND PELVIS WITH CONTRAST TECHNIQUE: Multidetector CT imaging of the abdomen and pelvis was performed using the standard protocol following bolus administration of intravenous contrast. CONTRAST:  141mL OMNIPAQUE IOHEXOL 300 MG/ML  SOLN COMPARISON:  August 10, 2010 FINDINGS: Lower chest: There is trace pericardial thickening/fluid. The heart is normal in size. No hiatal hernia. The visualized portions of the lungs are clear. Hepatobiliary: Again noted in the left liver lobe a 8 mm fat containing lesion as on the prior exam. The remainder of the liver parenchyma is unremarkable.The main portal vein is patent. No evidence of calcified gallstones, gallbladder wall thickening or biliary dilatation. Pancreas: Unremarkable. No pancreatic ductal dilatation or surrounding inflammatory changes. Spleen: Normal in size without focal abnormality. Adrenals/Urinary Tract: Both adrenal glands appear normal. There is a 1 cm low-density lesion seen in the lower of the right kidney, likely simple renal cysts. Stomach/Bowel: The stomach is normal appearance. The small bowel is unremarkable without evidence of obstruction or bowel wall thickening. There is bowel thickening seen extending from the splenic flexure to the descending colon measuring approximately 17 cm in length. Surrounding mesenteric fat stranding changes are seen surrounding this portion of the colon. No pericolonic loculated fluid collection or free air. There are scattered  sigmoid colonic diverticula. The appendix is normal appearance. Vascular/Lymphatic: There are no enlarged  mesenteric, retroperitoneal, or pelvic lymph nodes. No significant vascular findings are present. Reproductive: The patient is status post hysterectomy. No adnexal masses or collections seen. Other: No evidence of abdominal wall mass or hernia. Musculoskeletal: No acute or significant osseous findings. IMPRESSION: 1. Splenic flexure and descending colitis, this could be due to infectious or inflammatory process. No evidence of pericolonic abscess or free air. 2. Unchanged probable tiny lipoma in the left liver  lobe 3. Trace pericardial thickening/effusion Electronically Signed   By: Prudencio Pair M.D.   On: 08/09/2018 16:02    Procedures Procedures (including critical care time)  Medications Ordered in ED Medications  sodium chloride 0.9 % bolus 500 mL (0 mLs Intravenous Stopped 08/09/18 1558)    Followed by  0.9 %  sodium chloride infusion (has no administration in time range)  amoxicillin-clavulanate (AUGMENTIN) 875-125 MG per tablet 1 tablet (has no administration in time range)  acetaminophen (TYLENOL) tablet 650 mg (has no administration in time range)    Or  acetaminophen (TYLENOL) suppository 650 mg (has no administration in time range)  pantoprazole (PROTONIX) EC tablet 40 mg (has no administration in time range)  0.9 %  sodium chloride infusion (has no administration in time range)  morphine 2 MG/ML injection 1 mg (has no administration in time range)  ondansetron (ZOFRAN) injection 4 mg (has no administration in time range)  ondansetron (ZOFRAN) injection 4 mg (4 mg Intravenous Given 08/09/18 1238)  fentaNYL (SUBLIMAZE) injection 50 mcg (50 mcg Intravenous Given 08/09/18 1238)  sodium chloride 0.9 % bolus 1,000 mL (0 mLs Intravenous Stopped 08/09/18 1558)  iohexol (OMNIPAQUE) 300 MG/ML solution 100 mL (100 mLs Intravenous Contrast Given 08/09/18 1516)  ondansetron (ZOFRAN) injection 4  mg (4 mg Intravenous Given 08/09/18 1632)  fentaNYL (SUBLIMAZE) injection 50 mcg (50 mcg Intravenous Given 08/09/18 1634)     Initial Impression / Assessment and Plan / ED Course  I have reviewed the triage vital signs and the nursing notes.  Pertinent labs & imaging results that were available during my care of the patient were reviewed by me and considered in my medical decision making (see chart for details).       KALONI BISAILLON is a 63 y.o. female who presents to ED for generalized abdominal pain at 11 PM today associated with several loose, nonbloody stools which then progressed to rectal bleeding.  From description, sounds as if bleeding was quite extensive.  She does have BRBPR on exam.  Hemoccult positive fortunately, she has hemoglobin currently of 15.9 and vitals are very reassuring.  BP of 168/101.  Has left-sided abdominal tenderness on exam.  Followed by GI with up-to-date colonoscopies per patient.  She spoke with them this morning who recommended she come to the ER for evaluation.   12:25 PM -  Discussed with Azucena Freed, GI PA. GI to evaluate in ED.   See GI consultation note for full recommendations. Evidence of colitis on CT -- recommend starting on Augmentin per GI. Hospitalist consulted who will admit.   Patient discussed with Dr. Kathrynn Humble who agrees with treatment plan.    Final Clinical Impressions(s) / ED Diagnoses   Final diagnoses:  Colitis  Lower GI bleed    ED Discharge Orders    None       Kellin Bartling, Ozella Almond, PA-C 08/09/18 1959    Varney Biles, MD 08/10/18 [phone]

## 2018-08-09 NOTE — ED Notes (Signed)
Pt given ginger ale.

## 2018-08-09 NOTE — ED Notes (Signed)
Admitting at bedside 

## 2018-08-09 NOTE — ED Notes (Signed)
Patient transported to CT 

## 2018-08-10 DIAGNOSIS — K559 Vascular disorder of intestine, unspecified: Secondary | ICD-10-CM

## 2018-08-10 DIAGNOSIS — R935 Abnormal findings on diagnostic imaging of other abdominal regions, including retroperitoneum: Secondary | ICD-10-CM

## 2018-08-10 DIAGNOSIS — Z8719 Personal history of other diseases of the digestive system: Secondary | ICD-10-CM | POA: Diagnosis not present

## 2018-08-10 LAB — CBC
HCT: 42.8 % (ref 36.0–46.0)
Hemoglobin: 14.1 g/dL (ref 12.0–15.0)
MCH: 32 pg (ref 26.0–34.0)
MCHC: 32.9 g/dL (ref 30.0–36.0)
MCV: 97.1 fL (ref 80.0–100.0)
Platelets: 217 10*3/uL (ref 150–400)
RBC: 4.41 MIL/uL (ref 3.87–5.11)
RDW: 13.2 % (ref 11.5–15.5)
WBC: 11.9 10*3/uL — ABNORMAL HIGH (ref 4.0–10.5)
nRBC: 0 % (ref 0.0–0.2)

## 2018-08-10 LAB — BASIC METABOLIC PANEL
Anion gap: 7 (ref 5–15)
BUN: 9 mg/dL (ref 8–23)
CO2: 25 mmol/L (ref 22–32)
Calcium: 8.4 mg/dL — ABNORMAL LOW (ref 8.9–10.3)
Chloride: 106 mmol/L (ref 98–111)
Creatinine, Ser: 0.76 mg/dL (ref 0.44–1.00)
GFR calc Af Amer: >60 mL/min (ref >60)
GFR calc non Af Amer: >60 mL/min (ref >60)
Glucose, Bld: 118 mg/dL — ABNORMAL HIGH (ref 70–99)
Potassium: 3.8 mmol/L (ref 3.5–5.1)
Sodium: 138 mmol/L (ref 135–145)

## 2018-08-10 MED ORDER — AMOXICILLIN-POT CLAVULANATE 875-125 MG PO TABS: 1.0000 | ORAL_TABLET | Freq: Two times a day (BID) | ORAL | 0 refills | Status: DC

## 2018-08-10 MED ORDER — FENTANYL CITRATE (PF) 100 MCG/2ML IJ SOLN: 50.0000 ug | INTRAMUSCULAR | Status: DC | PRN

## 2018-08-10 MED FILL — AMOX-CLAV 875-125 MG TABLET: 875-125 | 7 days supply | Qty: 14 | Fill #0

## 2018-08-10 NOTE — Progress Notes (Signed)
Progress Note    ASSESSMENT AND PLAN:   Abdominal pain / hematochezia. Suspect ischemia colits based on distribution of inflammation on CTscan >> splenic flexure and descending colon colitis. Etiology not yet clear at this point ( no hx of constipation, hypercoag state, Afib, and doesn't seem med related). Made need CTA at some point. She feels better, no BMs or hematochezia today. Afebrile , WBC down 13.6 >>> 11.9. Hgb at baseline, the 15.9 yesterday was (higher than baseline of mid 14). Ate a few bites of breakfast -continue Augmention for total of 7 days for prevention of translocation of bacteria from bowel.  -soft diet for next 7-10 days.  -Ok for discharge from GI standpoint. Follow up made with me.     SUBJECTIVE   Feels better, no further hematochezia. Feels like she is able to go home    OBJECTIVE:     Vital signs in last 24 hours: Temp:  [98.1 F (36.7 C)-98.8 F (37.1 C)] 98.8 F (37.1 C) (07/30 0511) Pulse Rate:  [58-89] 70 (07/30 0902) Resp:  [16-20] 18 (07/30 0902) BP: (129-172)/(64-112) 146/79 (07/30 0902) SpO2:  [93 %-98 %] 98 % (07/30 0511) Weight:  [114.4 kg-115.5 kg] 115.5 kg (07/30 0511) Last BM Date: 08/09/18 General:   Alert, well-developed female in NAD EENT:  Normal hearing, non icteric sclera, conjunctive pink.  Heart:  Regular rate and rhythm; no murmur.  No lower extremity edema   Pulm: Normal respiratory effort, lungs CTA bilaterally without wheezes or crackles. Abdomen:  Soft, nondistended, moderate LLQ  / left med abdominal tenderness.  Normal bowel sounds,.       Neurologic:  Alert and  oriented x4;  grossly normal neurologically. Psych:  Pleasant, cooperative.  Normal mood and affect.   Intake/Output from previous day: 07/29 0701 - 07/30 0700 In: 3068.5 [P.O.:300; I.V.:1268.5; IV Piggyback:1500] Out: -  Intake/Output this shift: No intake/output data recorded.  Lab Results: Recent Labs    08/09/18 1103 08/10/18 0829  WBC  13.6* 11.9*  HGB 15.9* 14.1  HCT 47.2* 42.8  PLT 242 217   BMET Recent Labs    08/09/18 1103 08/10/18 0829  NA 138 138  K 4.0 3.8  CL 104 106  CO2 24 25  GLUCOSE 104* 118*  BUN 15 9  CREATININE 0.77 0.76  CALCIUM 9.0 8.4*   LFT Recent Labs    08/09/18 1103  PROT 6.8  ALBUMIN 4.0  AST 18  ALT 20  ALKPHOS 72  BILITOT 0.7    Ct Abdomen Pelvis W Contrast  Result Date: 08/09/2018 CLINICAL DATA:  GI bleed abdominal pain, colitis left abdomen EXAM: CT ABDOMEN AND PELVIS WITH CONTRAST TECHNIQUE: Multidetector CT imaging of the abdomen and pelvis was performed using the standard protocol following bolus administration of intravenous contrast. CONTRAST:  148mL OMNIPAQUE IOHEXOL 300 MG/ML  SOLN COMPARISON:  August 10, 2010 FINDINGS: Lower chest: There is trace pericardial thickening/fluid. The heart is normal in size. No hiatal hernia. The visualized portions of the lungs are clear. Hepatobiliary: Again noted in the left liver lobe a 8 mm fat containing lesion as on the prior exam. The remainder of the liver parenchyma is unremarkable.The main portal vein is patent. No evidence of calcified gallstones, gallbladder wall thickening or biliary dilatation. Pancreas: Unremarkable. No pancreatic ductal dilatation or surrounding inflammatory changes. Spleen: Normal in size without focal abnormality. Adrenals/Urinary Tract: Both adrenal glands appear normal. There is a 1 cm low-density lesion seen in the lower of the  right kidney, likely simple renal cysts. Stomach/Bowel: The stomach is normal appearance. The small bowel is unremarkable without evidence of obstruction or bowel wall thickening. There is bowel thickening seen extending from the splenic flexure to the descending colon measuring approximately 17 cm in length. Surrounding mesenteric fat stranding changes are seen surrounding this portion of the colon. No pericolonic loculated fluid collection or free air. There are scattered sigmoid colonic  diverticula. The appendix is normal appearance. Vascular/Lymphatic: There are no enlarged mesenteric, retroperitoneal, or pelvic lymph nodes. No significant vascular findings are present. Reproductive: The patient is status post hysterectomy. No adnexal masses or collections seen. Other: No evidence of abdominal wall mass or hernia. Musculoskeletal: No acute or significant osseous findings. IMPRESSION: 1. Splenic flexure and descending colitis, this could be due to infectious or inflammatory process. No evidence of pericolonic abscess or free air. 2. Unchanged probable tiny lipoma in the left liver  lobe 3. Trace pericardial thickening/effusion Electronically Signed   By: Prudencio Pair M.D.   On: 08/09/2018 16:02     Principal Problem:   Ischemic colitis ([place]) Active Problems:   GERD (gastroesophageal reflux disease)   Hypothyroidism   Hyperlipidemia   HTN (hypertension)     LOS: 0 days   Tye Savoy ,NP 08/10/2018, 9:45 AM

## 2018-08-10 NOTE — Discharge Summary (Signed)
Physician Discharge Summary  Debbie Miranda PTW:[id number] DOB: Jan 09, 1956 DOA: 08/09/2018  PCP: Kathyrn Drown, MD  Admit date: 08/09/2018 Discharge date: 08/10/2018  Admitted From: home Discharge disposition: home   Recommendations for Outpatient Follow-Up:   1. augmentin for 7 days 2. Referral to cardiology: event monitor for a fib and ? Stress test-- has been having some dyspnea with exertion   Discharge Diagnosis:   Principal Problem:   Ischemic colitis ([place]) Active Problems:   GERD (gastroesophageal reflux disease)   Hypothyroidism   Hyperlipidemia   HTN (hypertension)    Discharge Condition: Improved.  Diet recommendation: soft diet  Wound care: None.  Code status: Full.   History of Present Illness:   Debbie Miranda is a 63 y.o. female with medical history significant of arthritis, diverticulosis, diverticulitis, esophageal reflux, hypertension, hyperlipidemia, hypothyroidism presenting to the hospital for evaluation of abdominal pain and rectal bleeding.  Patient states since around 11 PM last night she has been having sharp, intermittent, severe left upper quadrant abdominal pain.  She has been feeling nauseous but has not vomited.  Last night she started having diarrhea soon after her abdominal pain started, then later it turned into just bright red blood per rectum.  She has continued to have several episodes of bright red blood per rectum throughout the day today.  The bouts of her abdominal pain have decreased in frequency today.  Denies any fevers or chills.  Denies any history of GI bleed.  She takes a baby aspirin daily but no other blood thinners.  Denies any history of A. fib.  No other complaints.   Hospital Course by Problem:   Ischemic colitis Hemodynamically stable.   CT scan and feels the changes are consistent with ischemic colitis.   Augmentin 875 mg twice daily for prophylaxis against translocation of gut bacteria. -outpatient follow  up with GI  Hypertension Blood pressure slightly elevated with systolic currently in the 140s to 150s.  Hypothyroidism -Continue home Synthroid  GERD -PPI  Hyperlipidemia -Continue Crestor    Medical Consultants:    GI  Discharge Exam:   Vitals:   08/10/18 0511 08/10/18 0902  BP: (!) 144/70 (!) 146/79  Pulse: 62 70  Resp: 20 18  Temp: 98.8 F (37.1 C) 98.6 F (37 C)  SpO2: 98%    Vitals:   08/09/18 2118 08/09/18 2126 08/10/18 0511 08/10/18 0902  BP: (!) 172/112  (!) 144/70 (!) 146/79  Pulse: 77  62 70  Resp: 19  20 18   Temp: 98.1 F (36.7 C)  98.8 F (37.1 C) 98.6 F (37 C)  TempSrc: Oral  Oral Oral  SpO2: 98%  98%   Weight:  114.4 kg 115.5 kg   Height:  5\' 8"  (1.727 m)      General exam: Appears calm and comfortable.    The results of significant diagnostics from this hospitalization (including imaging, microbiology, ancillary and laboratory) are listed below for reference.     Procedures and Diagnostic Studies:   Ct Abdomen Pelvis W Contrast  Result Date: 08/09/2018 CLINICAL DATA:  GI bleed abdominal pain, colitis left abdomen EXAM: CT ABDOMEN AND PELVIS WITH CONTRAST TECHNIQUE: Multidetector CT imaging of the abdomen and pelvis was performed using the standard protocol following bolus administration of intravenous contrast. CONTRAST:  152mL OMNIPAQUE IOHEXOL 300 MG/ML  SOLN COMPARISON:  August 10, 2010 FINDINGS: Lower chest: There is trace pericardial thickening/fluid. The heart is normal in size. No hiatal hernia. The visualized  portions of the lungs are clear. Hepatobiliary: Again noted in the left liver lobe a 8 mm fat containing lesion as on the prior exam. The remainder of the liver parenchyma is unremarkable.The main portal vein is patent. No evidence of calcified gallstones, gallbladder wall thickening or biliary dilatation. Pancreas: Unremarkable. No pancreatic ductal dilatation or surrounding inflammatory changes. Spleen: Normal in size without  focal abnormality. Adrenals/Urinary Tract: Both adrenal glands appear normal. There is a 1 cm low-density lesion seen in the lower of the right kidney, likely simple renal cysts. Stomach/Bowel: The stomach is normal appearance. The small bowel is unremarkable without evidence of obstruction or bowel wall thickening. There is bowel thickening seen extending from the splenic flexure to the descending colon measuring approximately 17 cm in length. Surrounding mesenteric fat stranding changes are seen surrounding this portion of the colon. No pericolonic loculated fluid collection or free air. There are scattered sigmoid colonic diverticula. The appendix is normal appearance. Vascular/Lymphatic: There are no enlarged mesenteric, retroperitoneal, or pelvic lymph nodes. No significant vascular findings are present. Reproductive: The patient is status post hysterectomy. No adnexal masses or collections seen. Other: No evidence of abdominal wall mass or hernia. Musculoskeletal: No acute or significant osseous findings. IMPRESSION: 1. Splenic flexure and descending colitis, this could be due to infectious or inflammatory process. No evidence of pericolonic abscess or free air. 2. Unchanged probable tiny lipoma in the left liver  lobe 3. Trace pericardial thickening/effusion Electronically Signed   By: Prudencio Pair M.D.   On: 08/09/2018 16:02     Labs:   Basic Metabolic Panel: Recent Labs  Lab 08/09/18 1103 08/10/18 0829  NA 138 138  K 4.0 3.8  CL 104 106  CO2 24 25  GLUCOSE 104* 118*  BUN 15 9  CREATININE 0.77 0.76  CALCIUM 9.0 8.4*   GFR Estimated Creatinine Clearance: 97.3 mL/min (by C-G formula based on SCr of 0.76 mg/dL). Liver Function Tests: Recent Labs  Lab 08/09/18 1103  AST 18  ALT 20  ALKPHOS 72  BILITOT 0.7  PROT 6.8  ALBUMIN 4.0   Recent Labs  Lab 08/09/18 1540  LIPASE 38   No results for input(s): AMMONIA in the last 168 hours. Coagulation profile No results for input(s):  INR, PROTIME in the last 168 hours.  CBC: Recent Labs  Lab 08/09/18 1103 08/10/18 0829  WBC 13.6* 11.9*  HGB 15.9* 14.1  HCT 47.2* 42.8  MCV 94.6 97.1  PLT 242 217   Cardiac Enzymes: No results for input(s): CKTOTAL, CKMB, CKMBINDEX, TROPONINI in the last 168 hours. BNP: Invalid input(s): POCBNP CBG: No results for input(s): GLUCAP in the last 168 hours. D-Dimer No results for input(s): DDIMER in the last 72 hours. Hgb A1c No results for input(s): HGBA1C in the last 72 hours. Lipid Profile No results for input(s): CHOL, HDL, LDLCALC, TRIG, CHOLHDL, LDLDIRECT in the last 72 hours. Thyroid function studies No results for input(s): TSH, T4TOTAL, T3FREE, THYROIDAB in the last 72 hours.  Invalid input(s): FREET3 Anemia work up No results for input(s): VITAMINB12, FOLATE, FERRITIN, TIBC, IRON, RETICCTPCT in the last 72 hours. Microbiology Recent Results (from the past 240 hour(s))  SARS Coronavirus 2 (CEPHEID - Performed in [place] hospital lab), Hosp Order     Status: None   Collection Time: 08/09/18 12:39 PM   Specimen: Nasopharyngeal Swab  Result Value Ref Range Status   SARS Coronavirus 2 NEGATIVE NEGATIVE Final    Comment: (NOTE) If result is NEGATIVE SARS-CoV-2 target nucleic  acids are NOT DETECTED. The SARS-CoV-2 RNA is generally detectable in upper and lower  respiratory specimens during the acute phase of infection. The lowest  concentration of SARS-CoV-2 viral copies this assay can detect is 250  copies / mL. A negative result does not preclude SARS-CoV-2 infection  and should not be used as the sole basis for treatment or other  patient management decisions.  A negative result may occur with  improper specimen collection / handling, submission of specimen other  than nasopharyngeal swab, presence of viral mutation(s) within the  areas targeted by this assay, and inadequate number of viral copies  (<250 copies / mL). A negative result must be combined with  clinical  observations, patient history, and epidemiological information. If result is POSITIVE SARS-CoV-2 target nucleic acids are DETECTED. The SARS-CoV-2 RNA is generally detectable in upper and lower  respiratory specimens dur ing the acute phase of infection.  Positive  results are indicative of active infection with SARS-CoV-2.  Clinical  correlation with patient history and other diagnostic information is  necessary to determine patient infection status.  Positive results do  not rule out bacterial infection or co-infection with other viruses. If result is PRESUMPTIVE POSTIVE SARS-CoV-2 nucleic acids MAY BE PRESENT.   A presumptive positive result was obtained on the submitted specimen  and confirmed on repeat testing.  While 2019 novel coronavirus  (SARS-CoV-2) nucleic acids may be present in the submitted sample  additional confirmatory testing may be necessary for epidemiological  and / or clinical management purposes  to differentiate between  SARS-CoV-2 and other Sarbecovirus currently known to infect humans.  If clinically indicated additional testing with an alternate test  methodology [phone]) is advised. The SARS-CoV-2 RNA is generally  detectable in upper and lower respiratory sp ecimens during the acute  phase of infection. The expected result is Negative. Fact Sheet for Patients:  StrictlyIdeas.no Fact Sheet for Healthcare Providers: BankingDealers.co.za This test is not yet approved or cleared by the [place] FDA and has been authorized for detection and/or diagnosis of SARS-CoV-2 by FDA under an Emergency Use Authorization (EUA).  This EUA will remain in effect (meaning this test can be used) for the duration of the COVID-19 declaration under Section 564(b)(1) of the Act, 21 U.S.C. section 360bbb-3(b)(1), unless the authorization is terminated or revoked sooner. Performed at [facility], [place]  [address], [place], [place] [address]      Discharge Instructions:   Discharge Instructions    Ambulatory referral to Cardiology   Complete by: As directed    Discharge instructions   Complete by: As directed    Avoid constipation Low-residue diet Will make referral to cardiology for holter monitor and stress test   Increase activity slowly   Complete by: As directed      Allergies as of 08/10/2018      Reactions   Codeine Nausea And Vomiting      Medication List    TAKE these medications   amoxicillin-clavulanate 875-125 MG tablet Commonly known as: AUGMENTIN Take 1 tablet by mouth every 12 (twelve) hours.   aspirin 81 MG tablet Take 81 mg by mouth daily.   Biotin 5000 MCG Caps Take 5,000 mcg by mouth daily with breakfast.   Centrum Silver tablet Take 1 tablet by mouth daily with breakfast.   levothyroxine 125 MCG tablet Commonly known as: SYNTHROID 1 q am What changed:   how much to take  how to take this  when to take this  additional instructions   mirabegron ER 25 MG Tb24 tablet Commonly known as: MYRBETRIQ Take 1 tablet (25 mg total) by mouth daily.   naproxen sodium 220 MG tablet Commonly known as: ALEVE Take 220-440 mg by mouth 2 (two) times daily as needed (for pain or headaches).   rosuvastatin 10 MG tablet Commonly known as: CRESTOR TAKE ONE TABLET BY MOUTH EVERY DAY. What changed:   how much to take  how to take this  when to take this  additional instructions   Vitamin D-3 25 MCG (1000 UT) Caps Take 1,000 Units by mouth daily with breakfast.      Follow-up Information    Willia Craze, NP Follow up on 09/05/2018.   Specialty: Gastroenterology Why: at 10: 00 am. Please arrive 10 minutes early Contact information: [place] [address] [phone]        Kathyrn Drown, MD Follow up in 1 week(s).   Specialty: Family Medicine Contact information: [place] [address]  [phone]            Time coordinating discharge: 25 min  Signed:  Geradine Girt DO  [facility] 08/10/2018, 1:39 PM

## 2018-08-10 NOTE — Progress Notes (Signed)
DISCHARGE NOTE HOME Debbie Miranda [facility] to be discharged Home per MD order. Discussed prescriptions and follow up appointments with the patient. Prescriptions given to patient; medication list explained in detail. Patient verbalized understanding.  Skin clean, dry and intact without evidence of skin break down, no evidence of skin tears noted. IV catheter discontinued intact. Site without signs and symptoms of complications. Dressing and pressure applied. Pt denies pain at the site currently. No complaints noted.  Patient free of lines, drains, and wounds.   An After Visit Summary (AVS) was printed and given to the patient. Patient escorted via wheelchair, and discharged home via private auto.  [name] BSN, RN3

## 2018-08-17 ENCOUNTER — Ambulatory Visit (INDEPENDENT_AMBULATORY_CARE_PROVIDER_SITE_OTHER): Payer: BC Managed Care – PPO | Admitting: Family Medicine

## 2018-08-17 ENCOUNTER — Other Ambulatory Visit: Payer: Self-pay

## 2018-08-17 DIAGNOSIS — G4700 Insomnia, unspecified: Secondary | ICD-10-CM | POA: Diagnosis not present

## 2018-08-17 DIAGNOSIS — K559 Vascular disorder of intestine, unspecified: Secondary | ICD-10-CM

## 2018-08-17 DIAGNOSIS — I313 Pericardial effusion (noninflammatory): Secondary | ICD-10-CM | POA: Diagnosis not present

## 2018-08-17 DIAGNOSIS — K625 Hemorrhage of anus and rectum: Secondary | ICD-10-CM

## 2018-08-17 DIAGNOSIS — I3139 Other pericardial effusion (noninflammatory): Secondary | ICD-10-CM

## 2018-08-17 NOTE — Progress Notes (Signed)
Subjective:    Patient ID: Debbie Miranda, female    DOB: 1955/07/07, 63 y.o.   MRN: [id number] Hospital follow-up HPI Hospitalization follow up on colitis. Doing better. Will finish augmentin tonight. Patient had what appeared to be ischemic colitis.  She had significant pain and discomfort and in several minutes later had profuse diarrhea which turned bloody.  Patient was seen in the ER and CAT scan was done showed descending colitis infectious versus ischemic.  Was seen by GI in the hospital they felt it was ischemic colitis.  This concerns the patient greatly.  She has a daughter-in-law who is a Hydrographic surveyor.  They were wondering about the vascular nature to her intestine.  It should also be noted that the patient had a colonoscopy back in 2017 and will be due in 2022.  Patient denies any high fever chills sweats.  Patient also had area of pericardial effusion which seems to be minimal at the base she does states she gets short of breath walking up steps but denies any swelling issues in her ankles no PND no orthopnea.  She admits that she is not exercising currently so therefore some of the shortness of breath going up steps could be deconditioning.  She denies any chest pressure tightness pain.  Having trouble sleeping. Wakes up and not able to go back to sleep always. Sleeps about 4 hours before she wakes up.  She relates that she goes to bed around 7 or 730 and then she wakes up around midnight or 1 and has difficult time going back to sleep and she finally gets up around 4 or 5 AM every day  We talked at length about how it might be best for her to start going to bed later at night possibly 8 or 830 and that would give her a better chance of sleeping into the middle of the night is such a long process through the dark of the night in regards to being awake as for sleeping pills I really would not want her to go that route unless she truly failed plan is necessary otherwise I recommend  melatonin Virtual Visit via Telephone Note  I connected with Debbie Miranda on 08/17/18 at 11:00 AM EDT by telephone and verified that I am speaking with the correct person using two identifiers.  Location: Patient: home Provider: office   I discussed the limitations, risks, security and privacy concerns of performing an evaluation and management service by telephone and the availability of in person appointments. I also discussed with the patient that there may be a patient responsible charge related to this service. The patient expressed understanding and agreed to proceed.   History of Present Illness:    Observations/Objective:   Assessment and Plan:   Follow Up Instructions:    I discussed the assessment and treatment plan with the patient. The patient was provided an opportunity to ask questions and all were answered. The patient agreed with the plan and demonstrated an understanding of the instructions.   The patient was advised to call back or seek an in-person evaluation if the symptoms worsen or if the condition fails to improve as anticipated.  I provided 25 minutes of non-face-to-face time during this encounter.       Review of Systems  Constitutional: Negative for activity change, appetite change and fatigue.  HENT: Negative for congestion and rhinorrhea.   Respiratory: Negative for cough and shortness of breath.   Cardiovascular: Negative for chest pain and  leg swelling.  Gastrointestinal: Negative for abdominal pain and diarrhea.  Endocrine: Negative for polydipsia and polyphagia.  Skin: Negative for color change.  Neurological: Negative for dizziness and weakness.  Psychiatric/Behavioral: Negative for behavioral problems and confusion.  She states her abdominal pain is settled down at this point     Objective:   Physical Exam  Today's visit was via telephone Physical exam was not possible for this visit       Assessment & Plan:  Ischemic  colitis She has an appointment with gastroenterology coming up We will look at doing some follow-up lab work Patient may well benefit from having further studies Please see discussion above regarding family's concerns regarding potential vascular issues with the intestines In addition to this patient was wondering if she may need to have a colonoscopy sooner rather than 2022 because of the significant amount of blood  Pericardial effusion although very difficult to tell on a CAT scan given that the CAT scan was focused on the abdomen I recommend an echo we will help set this  Insomnia issues I believe the patient would benefit from going to bed later I also feel that she should try melatonin but also told her that many individuals sleep only for 5 hours per night as they get older.  Hold off on any type of prescription sleep pill currently.  25 minutes was spent with the patient.  This statement verifies that 25 minutes was indeed spent with the patient.  More than 50% of this visit-total duration of the visit-was spent in counseling and coordination of care. The issues that the patient came in for today as reflected in the diagnosis (s) please refer to documentation for further details.  We will be ordering some lab work on her

## 2018-08-18 ENCOUNTER — Telehealth: Payer: Self-pay | Admitting: Family Medicine

## 2018-08-18 DIAGNOSIS — K559 Vascular disorder of intestine, unspecified: Secondary | ICD-10-CM

## 2018-08-18 DIAGNOSIS — I3139 Other pericardial effusion (noninflammatory): Secondary | ICD-10-CM

## 2018-08-18 DIAGNOSIS — R5383 Other fatigue: Secondary | ICD-10-CM

## 2018-08-18 DIAGNOSIS — E039 Hypothyroidism, unspecified: Secondary | ICD-10-CM

## 2018-08-18 DIAGNOSIS — I313 Pericardial effusion (noninflammatory): Secondary | ICD-10-CM

## 2018-08-18 NOTE — Telephone Encounter (Signed)
FYI - Pt wanted Dr. Nicki Reaper to know that [facility] called late yesterday PM - hospital recommended pt have a cardio consult  Pt only wants consult if Dr. Nicki Reaper feels it's necessary & with someone he recommends

## 2018-08-23 NOTE — Telephone Encounter (Signed)
Nurses Please talk with patient Based upon the conversation that she had with myself at the time of her virtual visit I believe what the patient needs is a echo. Please order outpatient echo through heart care in [facility] Diagnosis pericardial effusion Please let the patient know that if the echo shows abnormality then the next step would be going ahead with cardiology. If the echo is normal then we would not necessarily have to do a cardiology consult I am not opposed to a cardiology consult and I do know several cardiologist in [place] if she would prefer to do a consult with the echo at the same time. Please see what the patient would like to do at this point  Also the patient does need to complete lab work before her visit with the GI doctor this can be through any [facility] I recommend CBC, metabolic 7, C-reactive protein-diagnosis colitis, hypocalcemia When she does the lab work we will get results and we will inform her of the results  If the patient is having any problems currently please alert me out this thank you

## 2018-08-23 NOTE — Telephone Encounter (Signed)
Pt contacted and verbalized understanding. Lab orders and Echo have been placed in Epic. Pt states she is not having any issues at this time.

## 2018-08-24 DIAGNOSIS — E039 Hypothyroidism, unspecified: Secondary | ICD-10-CM | POA: Diagnosis not present

## 2018-08-24 DIAGNOSIS — K559 Vascular disorder of intestine, unspecified: Secondary | ICD-10-CM | POA: Diagnosis not present

## 2018-08-24 DIAGNOSIS — I313 Pericardial effusion (noninflammatory): Secondary | ICD-10-CM | POA: Diagnosis not present

## 2018-08-24 DIAGNOSIS — R252 Cramp and spasm: Secondary | ICD-10-CM | POA: Diagnosis not present

## 2018-08-25 LAB — CBC WITH DIFFERENTIAL/PLATELET
Basophils Absolute: 0 10*3/uL (ref 0.0–0.2)
Basos: 1 %
EOS (ABSOLUTE): 0.1 10*3/uL (ref 0.0–0.4)
Eos: 1 %
Hematocrit: 44.1 % (ref 34.0–46.6)
Hemoglobin: 14.6 g/dL (ref 11.1–15.9)
Immature Grans (Abs): 0 10*3/uL (ref 0.0–0.1)
Immature Granulocytes: 0 %
Lymphocytes Absolute: 1.9 10*3/uL (ref 0.7–3.1)
Lymphs: 26 %
MCH: 31.9 pg (ref 26.6–33.0)
MCHC: 33.1 g/dL (ref 31.5–35.7)
MCV: 97 fL (ref 79–97)
Monocytes Absolute: 0.4 10*3/uL (ref 0.1–0.9)
Monocytes: 6 %
Neutrophils Absolute: 4.9 10*3/uL (ref 1.4–7.0)
Neutrophils: 66 %
Platelets: 182 10*3/uL (ref 150–450)
RBC: 4.57 x10E6/uL (ref 3.77–5.28)
RDW: 12.6 % (ref 11.7–15.4)
WBC: 7.3 10*3/uL (ref 3.4–10.8)

## 2018-08-25 LAB — BASIC METABOLIC PANEL
BUN/Creatinine Ratio: 16 (ref 12–28)
BUN: 12 mg/dL (ref 8–27)
CO2: 25 mmol/L (ref 20–29)
Calcium: 9.1 mg/dL (ref 8.7–10.3)
Chloride: 102 mmol/L (ref 96–106)
Creatinine, Ser: 0.76 mg/dL (ref 0.57–1.00)
GFR calc Af Amer: 97 mL/min/{1.73_m2} (ref >59)
GFR calc non Af Amer: 84 mL/min/{1.73_m2} (ref >59)
Glucose: 88 mg/dL (ref 65–99)
Potassium: 4.1 mmol/L (ref 3.5–5.2)
Sodium: 140 mmol/L (ref 134–144)

## 2018-08-25 LAB — C-REACTIVE PROTEIN: CRP: 3 mg/L (ref 0–10)

## 2018-08-26 ENCOUNTER — Encounter: Payer: Self-pay | Admitting: Family Medicine

## 2018-08-27 NOTE — Telephone Encounter (Signed)
Noted and thanks for update

## 2018-08-28 ENCOUNTER — Other Ambulatory Visit (HOSPITAL_COMMUNITY): Payer: BC Managed Care – PPO

## 2018-08-29 ENCOUNTER — Telehealth: Payer: Self-pay | Admitting: *Deleted

## 2018-08-29 NOTE — Telephone Encounter (Signed)
Please see result note I sent back to you.

## 2018-08-29 NOTE — Telephone Encounter (Signed)
Called lab and had test added. Hold message to see if lab is able to run test. Pt was notified.

## 2018-08-29 NOTE — Telephone Encounter (Signed)
Debbie Miranda- Please add testing as planned from result note thank you

## 2018-08-30 NOTE — Telephone Encounter (Signed)
Test were done and note sent to dr scott to review.

## 2018-08-31 ENCOUNTER — Ambulatory Visit (HOSPITAL_COMMUNITY): Payer: BC Managed Care – PPO | Attending: Cardiovascular Disease

## 2018-08-31 ENCOUNTER — Other Ambulatory Visit: Payer: Self-pay

## 2018-08-31 DIAGNOSIS — I313 Pericardial effusion (noninflammatory): Secondary | ICD-10-CM | POA: Insufficient documentation

## 2018-08-31 DIAGNOSIS — K559 Vascular disorder of intestine, unspecified: Secondary | ICD-10-CM | POA: Insufficient documentation

## 2018-09-04 ENCOUNTER — Encounter: Payer: Self-pay | Admitting: Internal Medicine

## 2018-09-04 ENCOUNTER — Encounter: Payer: Self-pay | Admitting: Family Medicine

## 2018-09-04 ENCOUNTER — Ambulatory Visit: Payer: BC Managed Care – PPO | Admitting: Internal Medicine

## 2018-09-04 VITALS — BP 114/68 | HR 68 | Temp 98.7°F | Ht 67.75 in | Wt 244.5 lb

## 2018-09-04 DIAGNOSIS — K55039 Acute (reversible) ischemia of large intestine, extent unspecified: Secondary | ICD-10-CM

## 2018-09-04 DIAGNOSIS — R1310 Dysphagia, unspecified: Secondary | ICD-10-CM | POA: Diagnosis not present

## 2018-09-04 DIAGNOSIS — K641 Second degree hemorrhoids: Secondary | ICD-10-CM | POA: Diagnosis not present

## 2018-09-04 DIAGNOSIS — L290 Pruritus ani: Secondary | ICD-10-CM | POA: Diagnosis not present

## 2018-09-04 DIAGNOSIS — R1319 Other dysphagia: Secondary | ICD-10-CM

## 2018-09-04 NOTE — Patient Instructions (Addendum)
Read the following - a lot of this does not apply to you but we can discuss more after the EGD  PRURITUS ANI   OVERVIEW  Pruritus ani is a common medical problem affecting both men and women. This information was composed to help patients understand pruritus ani, its symptoms, evaluation, and treatment options.  This information may also be helpful to individuals or caregivers of patients who are suffering from pruritus ani.  Pruritus ani most commonly affects adults, affecting from 1% to 5% of people in the general population. Men are more commonly affected than women with a 4:1 ratio. The condition is most common in people age 61s to 57s.  There are many causes of pruritis ani, and an accurate diagnosis is important in order to treat the specific cause.  Medical management of pruritis ani often provides patients with relief of their symptoms and improves their quality of life.  WHAT IS PRURITUS ANI?  Pruritus ani is a Latin term meaning "itchy anus" and is defined as an unpleasant sensation of the skin around the anus (i.e., rectal opening) that produces the desire to scratch. Pruritus ani is classified as primary or secondary. The primary form is the classic syndrome which may not have an identifiable cause (referred to as "idiopathic") and the secondary form has an identifiable, and often specifically treatable, cause.  Minimal stimulation of the skin may cause itching. The subsequent scratching may cause injury to the skin which produces a larger area of irritated skin. Continued scratching causes the need to scratch more, making the problem worse.  WHAT CAUSES PRURITUS ANI?  This symptom of pruritus or itching is common to many anorectal conditions.  One must consider hemorrhoids, excessive skin tags, fecal soilage or incontinence, anal fistulae (abnormal passageways between the bowel and an organ or skin surface), anal fissures (painful clefts or grooves) and anal warts as possible  causative agents.  It is not always understood what causes the long-standing history of primary pruritis ani. It is believed that an irritating secretion from the anal canal may cause the itching. The local nerve fibers in the skin may become chronically active with repetitive trauma or scratching for prolonged periods of time. There can also be itching related to disorders of nerve pathways or itching related to a central nervous system stimulus such as medications. Occasionally, itching may also be psychogenic (symptoms arise from the mind, as opposed to another organ).  Other potential causes of irritation include moisture from sweat, stool and mucus.  Studies have shown that the relief of symptoms can occur promptly after the stool has been cleansed from the perianal area, indicating that stool is likely an irritant causing of itching. In addition to difficult or inadequate hygiene, overzealous or aggressive hygiene with the use of many irritating soaps, scents, and lotions may cause pruritis ani, resulting in this condition occasionally being referred to as "polished anus syndrome."  Overzealous cleaning, in addition to the use of topical steroids, can destroy natural skin barriers and cause trauma to the anal skin, making the problem worse.  In a way, trying to keep it "too clean" may worsen the problem.  Dietary factors may also play a role with pruritis ani, although there are not definitive studies implicating particular food items or diets.  Coffee, either caffeinated or decaffeinated, is thought to be a major contributing factor. Coffee consumption may lower the anal resting pressure (normal strength of muscle contraction at rest) and contribute to anal leakage of stool. Other dietary  agents which are possible causes of pruritis ani include tea, cola, energy drinks, chocolate, citrus fruits, tomatoes, spicy foods, beer, dairy products and nuts.  Infectious processes may also result in pruritus ani.  Examples include bacterial skin infections, fungal infection (although a common fungus, Candida albicans, appears to be a normal inhabitant of the perianal skin), parasitic infections with pinworms or scabies, and viral infections with anal warts.   Numerous skin conditions also cause secondary pruritis ani. Conditions that are potential causes of pruritus ani include psoriasis, seborrheic dermatitis, atopic dermatitis, contact dermatitis, lichen planus, lichen simplex, and lichen sclerosis. Local cancers such as Bowen's disease or extra mammary Paget's disease are also potential causes of pruritis ani.  Medical diseases that affect the entire body may also cause pruritus ani. The examples include diabetes mellitus, leukemia and lymphoma, kidney failure, liver diseases (obstructive jaundice), iron deficiency anemia, or hyperthyroidism.   While this is a wide variety of potential causes, it is important to understand that in many cases the itching has no identifiable source.  PATIENT EVALUATION  A careful medical history must be obtained from the patient focusing on the timing and duration of the pruritus ani as well as any accompanying symptoms.  Toileting behaviors must be evaluated, including the frequency and quality of stools, possible stool or mucus leakage, perianal moisture sensation, or the sensation of incomplete evacuation of stool. In addition, hygiene rituals or cleansing methods after a bowel movement must be evaluated.  Travel history and current medications, including all topical agents used, must be reviewed. A detailed diet history should be taken with close attention to known dietary agents suspected of causing pruritus ani. Often a heavy consumption of caffeine with coffee, tea, cola, or energy drinks is present. Lastly, investigation regarding possible infectious agents, such as pinworms in children, should also be considered. Adults rarely harbor pinworms.  Your doctor will perform  an office physical examination to provide information regarding a possible cause of the symptoms. Examination should include a thorough inspection of the skin around the anus. The skin may appear normal or may have findings such as open wounds or cracks, redness, or possible characteristic changes of thickened skin  due to repeated scratching and irritation.  Primary or idiopathic pruritus ani is classified by a staging system used at [facility], and is based on the physical features of the skin. Stage 0 is normal skin, stage 1 is red and inflamed skin, stage 2 has thickened skin, and stage 3 has thickened skin, coarse ridges, and often ulcerations. (YOU LOOK STAGE 0)    In addition to inspection of the perianal region, your doctor may place a finger through the anus into the rectum (digital rectal exam).  Part of the evaluation of the anal canal may be completed with a small instrument or anoscope which is referred to as "anoscopy." An office biopsy with a small 3 or 4 mm piece of tissue may also be obtained to help with diagnosis and decision-making regarding treatment.  Skin swabs or scrapings may also be obtained.  Physical examination may also involve inspection of other sites of involvement.  The detailed anal examination is necessary, but brief, and patients should not feel embarrassed. The examination may have some feelings of discomfort but should not be painful.  TREATMENT OF PRURITUS ANI  The goal of therapy is to restore clean, dry, and intact skin. Treatment can be challenging, as many cases have no clear identifiable cause.  It is important to use bowel  medications to thicken stool and create a formed bowel movement to minimize leakage or seepage and also to allow for complete evacuation.  The goal is a soft, bulky, easy to clean stool. Most people can benefit from taking a fiber supplement (Citrucel, Metamucil, Fibercon, Benefiber, and Konsyl are examples). This can be  taken in powder or capsule/tablet form and is usually taken once or twice daily. The fiber serves to absorb the moisture from the stool, adding bulk and allowing for complete evacuation of stool during bowel movements.  If stools still remain loose, additional medications may be helpful. Imodium is an antidiarrheal medication which can thicken or firm stool and help decrease seepage. In more difficult cases, prescription medications such as Lomotil may be needed to thicken the stool. Your physician can help decide which medications may be best for you.  Dietary changes are often necessary for treatment.  There are several common foods which may be related to pruritus ani.  These foods and beverages include coffee, colas, tea, chocolate, tomatoes and beer.  These items may possibly decrease your sphincter tone which can cause some seepage or leakage.  Avoiding overuse of these items may improve symptoms.  It may be helpful to remove one item at a time from your diet for several weeks.  If your symptoms improve, you could try reintroduction of the item in smaller volume and see if there is a limit to which you may have that item without producing symptoms.  It will also be important to modify bowel hygiene or cleaning habits. It must be stressed that the anus does not need to be scrubbed or sterilized. Cleaning with plain water rinses is quite helpful. Soaps, perfumes, dyes in tissue or clothing, and baby wipes containing deodorants should be avoided because they can act as irritants. Alcohol and witch hazel agents should similarly be avoided. Bathing with Dove soap is recommended, as it is free of conventional soap. Also, handheld detachable shower heads can be used to clean and wash away any remaining soap residue.  The same effect can also be created with a bidet, although they are not common in the [place]  Balneol is a gentle and soothing cleaning agent.  It is commercially available mineral oil-based  preparation that can be used at home or taken along in a pocket or a purse for use in public facilities. Another possible cleaning agent is dilute white vinegar. One tablespoon in an 8 ounce glass of water can be kept in the bathroom and applied with a cotton ball. Burrow's solution, diluted at 1:40 (one [place] tablet in 12 ounces of water or one tablet in six ounces of water for 1:20 solution) is also a gentle and non-irritating cleanser. It can be kept in a plastic squeeze bottle in the refrigerator and used in place of soap and water.  The ultimate goal of treatment is to create dry, healthy, and intact skin. The skin can be dried after cleansing using a hair dryer on low setting. An athlete's foot powder or Zeasorb, a lubricating and drying agent in powder form can also be used to absorb moisture. After drying, the athlete's foot powder or Zeasorb can be applied, and a small piece of cotton can be placed between the buttocks and against the anus to help absorb the moisture. Tight fitting, synthetic undergarments should be avoided.  One of the most important, but often most difficult, aspects of the management of pruritis ani, is to avoid trauma to the skin. This means  no scratching with hands or dry toilet paper. Behavioral modification is often very difficult to achieve, due to the intense desire to scratch. Many people also scratch during sleep and are not aware of it until they wake to find themselves scratching. It is often recommended to have patients cut their nails and wear a pair of light, soft, cotton gloves on their hands at night so they are not able to scratch.  In order to control symptoms, a short course of a steroid ointment may be tried. A weak topical steroid such as 1% hydrocortisone cream used two to three times a day for a short period of time can be effective in relieving symptoms of pruritis. A long-acting topical steroid such as betamethasone may also be effective. Strong  steroids or prolonged use can lead to skin atrophy (weakness and thinning) which sometimes worsens pruritus ani. High potency steroids should not be used for more than four to eight weeks. If there is thinned or denuded skin, topical antibiotics may occasionally be helpful. It should be noted that cream forms of medication cause more thinning or atrophy than ointment forms.  A skin barrier cream such as zinc oxide may also be helpful in protecting the skin around the anus from irritants. Additional topical agents such as numbing medications, menthol, phenol, camphor, or a combination of them may be helpful. Calmoseptine is used frequently, with a combination of zinc oxide and menthol and can be very beneficial at relieving patients' symptoms. If there is any concern that there may be an infection, topical antibiotics (gentamicin, clindamycin, or bacitracin) or antifungals (clotrimazole, nystatin) may be added in conjunction with other therapies. They can be applied at nighttime before bed and again in the morning after bathing.  Patients coming to the doctor for evaluation of pruritus ani with moderate to severe changes of the skin, may be treated by application of Berwick's dye (which is a combination of gentian violet and brilliant green pigment) with alcohol. It can relieve itching but will sting if there are open wounds. Your physician can dry the dye with a hair dryer and it can be sealed in place with Benzoin tincture and then again dried in place. This dye can stay in place for several days and will often give great relief while the skin is able to regenerate or re-epithelialize. This treatment is performed in an office setting, but is not used in the home setting.  You may notice that your problems will improve for some time with treatment but then recur.  For a small number of patients, pruritus ani can be quite difficult to manage, and it may be difficult to completely relieve their symptoms.  In  these patients, it may be beneficial to try topical capsaicin. Capsaicin comes from Capsicum chili peppers.  It is believed to work by depressing the feelings or desensitizing certain nerves.  This medicine has been studied with a small number of patients, and up to 70% of patients had relief of their symptoms for up to almost 11 months.  The medicine is applied with a very low concentration of 0.006%.   A very small number of patients find only minimal relief from all attempted treatment options.  These individuals may benefit from injectable therapy.  This particular therapy is saved for patients with persistent and intense pruritus ani.  Methylene blue is a dye which can be injected into the skin and may relieve symptoms by causing destruction of the nerve endings.  The methylene blue can  be mixed with topical anesthetics and injected into and below the affected perianal region.  Many patients do experience a change in sensation in the injected area.  It may feel somewhat numb like a local anesthetic for a dental procedure.  It also will turn the skin in the area blue.  In very rare cases, this may be injected too close to the surface of the skin and may cause some skin breakdown or ulcerations.     You have been scheduled for an endoscopy. Please follow written instructions given to you at your visit today. If you use inhalers (even only as needed), please bring them with you on the day of your procedure.   Please read over the hemorrhoid banding brochure you have been given today.   We have given you a sample of Calmoseptine to try for your rectal itching along with information about this.   I appreciate the opportunity to care for you. Silvano Rusk, MD, [facility]

## 2018-09-04 NOTE — Progress Notes (Signed)
Debbie Miranda 63 y.o. 1955/05/24 [id number]  Assessment & Plan:    Encounter Diagnoses  Name Primary?  . Acute ischemic colitis ([place]) - resolved Yes  . Esophageal dysphagia   . Pruritus ani   . Prolapsed internal hemorrhoids, grade 2    Colitis is resolved and I explained the nature of this which usually related to a spasm and nonocclusive disease i.e. nothing permanent.  Hopefully will not recur.  Evaluate dysphagia with EGD consider dilation of the esophagus.  She may need to go back on reflux treatment.  She does seem to limit caffeine and does not smoke.  Weight may be an issue.  I gave her information regarding pruritus ani its nature and treatment.  Try some calmoseptine ointment to see if that helps.  Her prolapsed hemorrhoids could be the source of mucus causing this.  We will discuss the possibility of banding her hemorrhoids further.  I did explain all that today and I think she is interested but that is do the EGD and go from there.   The risks and benefits as well as alternatives of endoscopic procedure(s) have been discussed and reviewed. All questions answered. The patient agrees to proceed.  I appreciate the opportunity to care for this patient. CC: Kathyrn Drown, MD    Subjective:   Chief Complaint: Follow-up ischemic colitis, dysphagia, anal itching  HPI The patient is here for follow-up after she suffered an episode of acute ischemic colitis identified by CT scanning last month, sudden onset of crampy left lower quadrant pain diarrhea and then bleeding.  It was in the watershed areas sigmoid colon.  She is completely recovered from that.  Colonoscopy 2017 with a 6 mm SSP polyp but no other findings.  She was never anemic during the hospitalization and her hemoglobin and white blood cell count were normal on August 13 and follow-up with primary care, notes reviewed.  Hospital records reviewed as well.  She is complaining of impact dysphagia to solid foods  at least a few times a month if not more, no significant heartburn.  10 years ago she had some erosive esophagitis but no stricture on an EGD that was the last one.  She is not taking PPI at this time.  She did in the past.  She also complains of pruritus a night which I have talked to her about before but it is continued, she has tried hydrocortisone creams hemorrhoid treatments.  She does not seem to have prolapsed hemorrhoid symptoms, she does feel moisture there at times.  Does not seem to be perspiration.  Very rare rectal bleeding.  This is on the tissue paper.  She is otherwise okay, being careful about COVID-19, concerned about her son who is an ENT physician in [place], there for the past 4 years after training at the [employer].  He has been well, fortunately.  Allergies  Allergen Reactions  . Codeine Nausea And Vomiting   Current Meds  Medication Sig  . aspirin 81 MG tablet Take 81 mg by mouth daily.  . Biotin 5000 MCG CAPS Take 5,000 mcg by mouth daily with breakfast.  . Cholecalciferol (VITAMIN D-3) 25 MCG (1000 UT) CAPS Take 1,000 Units by mouth daily with breakfast.   . levothyroxine (SYNTHROID) 125 MCG tablet 1 q am (Patient taking differently: Take 125 mcg by mouth daily before breakfast. )  . mirabegron ER (MYRBETRIQ) 25 MG TB24 tablet Take 1 tablet (25 mg total) by mouth daily.  Marland Kitchen  Multiple Vitamins-Minerals (CENTRUM SILVER) tablet Take 1 tablet by mouth daily with breakfast.   . naproxen sodium (ALEVE) 220 MG tablet Take 220-440 mg by mouth as needed (for pain or headaches).   . rosuvastatin (CRESTOR) 10 MG tablet TAKE ONE TABLET BY MOUTH EVERY DAY. (Patient taking differently: Take 10 mg by mouth at bedtime. )   Past Medical History:  Diagnosis Date  . Arthritis   . 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 Contact lens/glasses fitting    wares contacts or glasses  . Costochondritis   . Diverticulitis 2012  . Esophageal reflux   . External  hemorrhoids without mention of complication [id number]  . Headache(784.0)   . Hyperlipidemia   . Hypertension    pt denies  . Hypothyroidism   . IFG (impaired fasting glucose)   . Mycotic toenails 12/10/2010   1st toenail right-lasered nail 12/10/2010-present (last on 06/15/2012)  . Obesity   . Personal history of colonic polyps 07/30/2015   SSA  . PONV (postoperative nausea and vomiting)   . Positive H. pylori test    Past Surgical History:  Procedure Laterality Date  . ABDOMINAL HYSTERECTOMY    . APPENDECTOMY    . COLONOSCOPY    . ESOPHAGOGASTRODUODENOSCOPY    . EXCISION HAGLUND'S DEFORMITY WITH ACHILLES TENDON REPAIR Right 06/07/2013   Procedure: RIGHT ACHILLES DEBRIDEMENT; [id number] EXCISION;  Surgeon: Wylene Simmer, MD;  Location: [facility];  Service: Orthopedics;  Laterality: Right;  . EXCISION OF SKIN TAG  11/18/2011   Procedure: EXCISION OF SKIN TAG;  Surgeon: Leighton Ruff, MD;  Location: [facility];  Service: General;  Laterality: N/A;  posterior  . GASTROCNEMIUS RECESSION Right 06/07/2013   Procedure: GASTROCNEMIUS RECESSION;  Surgeon: Wylene Simmer, MD;  Location: [facility];  Service: Orthopedics;  Laterality: Right;  . HEMORRHOID SURGERY  11/18/2011   Procedure: HEMORRHOIDECTOMY;  Surgeon: Leighton Ruff, MD;  Location: [facility];  Service: General;;  . LEFT OOPHORECTOMY    . LUNG LOBECTOMY  12/2011   [place] removed for carcinoid  . PLANTAR FASCIA SURGERY    . rectal fissure repair    . THYROID SURGERY     nodule and partial thyroidectomy  . TONSILLECTOMY     Social History   Social History Narrative   She is married, she is a Agricultural engineer, she has 1 son who is a Sport and exercise psychologist, he is an Engineer, mining in [place]   Very occasional alcohol never smoker no tobacco no drug use   family history includes Breast cancer in her maternal grandmother; Cancer in her maternal grandmother;  Heart disease in her maternal grandfather; Kidney cancer in her father.   Review of Systems All other review of systems negative except as per HPI  Objective:   Physical Exam @BP  114/68 (BP Location: Left Arm, Patient Position: Sitting, Cuff Size: Large)   Pulse 68   Temp 98.7 F (37.1 C)   Ht 5' 7.75" (1.721 m)   Wt 244 lb 8 oz (110.9 kg)   BMI 37.45 kg/m @  General:  Well-developed, well-nourished and in no acute distress Eyes:  anicteric. Lungs: Clear to auscultation bilaterally. Heart:  S1S2, no rubs, murmurs, gallops. Abdomen:  soft, non-tender, no hepatosplenomegaly, hernia, or mass and BS+.  Rectal:  Jackolyn Confer, CMA present  The anoderm looks normal no significant tags no moisture no rash Digital exam is nontender and normal no rectocele there is formed brown  stool in the vault and no mass  Anoscopic exam shows grade 2 internal hemorrhoids in all 3 positions with small inflamed external components.   Lymph:  no cervical or supraclavicular adenopathy. Extremities:   no edema, cyanosis or clubbing Skin   no rash. Neuro:  A&O x 3.  Psych:  appropriate mood and  Affect.   Data Reviewed: See HPI

## 2018-09-05 ENCOUNTER — Ambulatory Visit (AMBULATORY_SURGERY_CENTER): Payer: BC Managed Care – PPO | Admitting: Internal Medicine

## 2018-09-05 ENCOUNTER — Encounter: Payer: Self-pay | Admitting: Internal Medicine

## 2018-09-05 ENCOUNTER — Ambulatory Visit: Payer: BC Managed Care – PPO | Admitting: Nurse Practitioner

## 2018-09-05 ENCOUNTER — Other Ambulatory Visit: Payer: Self-pay

## 2018-09-05 VITALS — BP 145/71 | HR 63 | Temp 99.3°F | Resp 16 | Ht 67.75 in | Wt 244.0 lb

## 2018-09-05 DIAGNOSIS — K228 Other specified diseases of esophagus: Secondary | ICD-10-CM | POA: Diagnosis not present

## 2018-09-05 DIAGNOSIS — K210 Gastro-esophageal reflux disease with esophagitis: Secondary | ICD-10-CM

## 2018-09-05 DIAGNOSIS — K222 Esophageal obstruction: Secondary | ICD-10-CM | POA: Diagnosis not present

## 2018-09-05 DIAGNOSIS — K2970 Gastritis, unspecified, without bleeding: Secondary | ICD-10-CM

## 2018-09-05 DIAGNOSIS — K449 Diaphragmatic hernia without obstruction or gangrene: Secondary | ICD-10-CM | POA: Diagnosis not present

## 2018-09-05 DIAGNOSIS — R1310 Dysphagia, unspecified: Secondary | ICD-10-CM

## 2018-09-05 DIAGNOSIS — K3189 Other diseases of stomach and duodenum: Secondary | ICD-10-CM | POA: Diagnosis not present

## 2018-09-05 DIAGNOSIS — K209 Esophagitis, unspecified without bleeding: Secondary | ICD-10-CM

## 2018-09-05 DIAGNOSIS — R1319 Other dysphagia: Secondary | ICD-10-CM

## 2018-09-05 MED ORDER — ESOMEPRAZOLE MAGNESIUM 40 MG PO CPDR: 40.0000 mg | DELAYED_RELEASE_CAPSULE | Freq: Every day | ORAL | 3 refills | Status: DC

## 2018-09-05 MED ORDER — SODIUM CHLORIDE 0.9 % IV SOLN: 500.0000 mL | Freq: Once | INTRAVENOUS | Status: DC

## 2018-09-05 NOTE — Progress Notes (Signed)
Temp-michele boyd  Vital signs-courtney washington

## 2018-09-05 NOTE — Op Note (Signed)
[facility] Patient Name: Debbie Miranda Procedure Date: 09/05/2018 3:22 PM MRN: [id number] Endoscopist: Gatha Mayer , MD Age: 63 Referring MD:  Date of Birth: 11-20-1955 Gender: Female Account #: [id number] Procedure:                Upper GI endoscopy Indications:              Dysphagia Medicines:                Propofol per Anesthesia, Monitored Anesthesia Care Procedure:                Pre-Anesthesia Assessment:                           - Prior to the procedure, a History and Physical                            was performed, and patient medications and                            allergies were reviewed. The patient's tolerance of                            previous anesthesia was also reviewed. The risks                            and benefits of the procedure and the sedation                            options and risks were discussed with the patient.                            All questions were answered, and informed consent                            was obtained. Prior Anticoagulants: The patient has                            taken no previous anticoagulant or antiplatelet                            agents. ASA Grade Assessment: II - A patient with                            mild systemic disease. After reviewing the risks                            and benefits, the patient was deemed in                            satisfactory condition to undergo the procedure.                           After obtaining informed consent, the endoscope was  passed under direct vision. Throughout the                            procedure, the patient's blood pressure, pulse, and                            oxygen saturations were monitored continuously. The                            Endoscope was introduced through the mouth, and                            advanced to the second part of duodenum. The upper                            GI endoscopy was  accomplished without difficulty.                            The patient tolerated the procedure well. Scope In: Scope Out: Findings:                 LA Grade B (one or more mucosal breaks greater than                            5 mm, not extending between the tops of two mucosal                            folds) esophagitis with no bleeding was found in                            the lower third of the esophagus. Biopsies were                            taken with a cold forceps for histology.                            Verification of patient identification for the                            specimen was done. Estimated blood loss was minimal.                           One benign-appearing, intrinsic moderate stenosis                            was found at the gastroesophageal junction. This                            stenosis measured 1.6 cm (inner diameter) x less                            than one cm (in length). The stenosis was  traversed. The scope was withdrawn. Dilation was                            performed with a Maloney dilator with mild                            resistance at 65 Fr. The dilation site was examined                            and showed mild mucosal disruption. Estimated blood                            loss was minimal.                           Patchy mild inflammation characterized by erosions                            and erythema was found in the gastric antrum.                            Biopsies were taken with a cold forceps for                            histology. Verification of patient identification                            for the specimen was done. Estimated blood loss was                            minimal.                           A 1 cm hiatal hernia was present.                           The exam was otherwise without abnormality.                           The cardia and gastric fundus were normal on                             retroflexion. Complications:            No immediate complications. Estimated Blood Loss:     Estimated blood loss was minimal. Impression:               - No specimens collected. Recommendation:           - Patient has a contact number available for                            emergencies. The signs and symptoms of potential                            delayed complications were discussed with the  patient. Return to normal activities tomorrow.                            Written discharge instructions were provided to the                            patient.                           - Clear liquids x 1 hour then soft foods rest of                            day. Start prior diet tomorrow.                           - Continue present medications.                           - Await pathology results.                           - Follow an antireflux regimen indefinitely.                           - Use Nexium (esomeprazole) 40 mg PO daily.                           - My office will contact and arrange hemorrhoid                            banding appointment Gatha Mayer, MD 09/05/2018 3:52:59 PM This report has been signed electronically.

## 2018-09-05 NOTE — Patient Instructions (Addendum)
The esophagus was inflamed and irritated and there was an area of scarring = stricture which I dilated.  Stomach also irritated and inflamed - biopsies there and in esophagus.  Nothing looks like cancer.  You need to go back on medicine for acid reflux - I sent a prescription for generic Nexium to [facility] - please start that and it will help heal these areas and reduce chances of needing another dilation. I will explain more late but very important to take this medication.  Also follow lifestyle changes as below. Follow an antireflux regimen indefinitely.  This includes:      - Do not lie down for at least 3 to 4 hours after meals.       - Raise the head of the bed 4 to 6 inches.       - Decrease excess weight.       - Avoid citrus juices and other acidic foods, alcohol, chocolate, mints, coffee and other caffeinated beverages, carbonated beverages, fatty and fried foods.       - Avoid tight-fitting clothing.       - Avoid cigarettes and other tobacco products.    My office will contact you to set up a hemorrhoid banding appointment also.  I appreciate the opportunity to care for you. Gatha Mayer, MD, [facility]  Please read handouts provided. Follow Dilation Diet. Use Nexium (esomeorazole) 40 mg daily. Await pathology results. Follow antireflux regimen.      YOU HAD AN ENDOSCOPIC PROCEDURE TODAY AT [facility]:   Refer to the procedure report that was given to you for any specific questions about what was found during the examination.  If the procedure report does not answer your questions, please call your gastroenterologist to clarify.  If you requested that your care partner not be given the details of your procedure findings, then the procedure report has been included in a sealed envelope for you to review at your convenience later.  YOU SHOULD EXPECT: Some feelings of bloating in the abdomen. Passage of more gas than usual.  Walking can help get  rid of the air that was put into your GI tract during the procedure and reduce the bloating. If you had a lower endoscopy (such as a colonoscopy or flexible sigmoidoscopy) you may notice spotting of blood in your stool or on the toilet paper. If you underwent a bowel prep for your procedure, you may not have a normal bowel movement for a few days.  Please Note:  You might notice some irritation and congestion in your nose or some drainage.  This is from the oxygen used during your procedure.  There is no need for concern and it should clear up in a day or so.  SYMPTOMS TO REPORT IMMEDIATELY:     Following upper endoscopy (EGD)  Vomiting of blood or coffee ground material  New chest pain or pain under the shoulder blades  Painful or persistently difficult swallowing  New shortness of breath  Fever of 100F or higher  Black, tarry-looking stools  For urgent or emergent issues, a gastroenterologist can be reached at any hour by calling [phone].   DIET:  Drink plenty of fluids but you should avoid alcoholic beverages for 24 hours.  ACTIVITY:  You should plan to take it easy for the rest of today and you should NOT DRIVE or use heavy machinery until tomorrow (because of the sedation medicines used during the test).  FOLLOW UP: Our staff will call the number listed on your records 48-72 hours following your procedure to check on you and address any questions or concerns that you may have regarding the information given to you following your procedure. If we do not reach you, we will leave a message.  We will attempt to reach you two times.  During this call, we will ask if you have developed any symptoms of COVID 19. If you develop any symptoms (ie: fever, flu-like symptoms, shortness of breath, cough etc.) before then, please call [phone].  If you test positive for Covid 19 in the 2 weeks post procedure, please call and report this information to [place].    If any biopsies were taken  you will be contacted by phone or by letter within the next 1-3 weeks.  Please call us at [phone] if you have not heard about the biopsies in 3 weeks.    SIGNATURES/CONFIDENTIALITY: You and/or your care partner have signed paperwork which will be entered into your electronic medical record.  These signatures attest to the fact that that the information above on your After Visit Summary has been reviewed and is understood.  Full responsibility of the confidentiality of this discharge information lies with you and/or your care-partner.

## 2018-09-05 NOTE — Progress Notes (Signed)
Called to room to assist during endoscopic procedure.  Patient ID and intended procedure confirmed with present staff. Received instructions for my participation in the procedure from the performing physician.  

## 2018-09-05 NOTE — Progress Notes (Signed)
Report to PACU, RN, vss, BBS= Clear.  

## 2018-09-07 ENCOUNTER — Telehealth: Payer: Self-pay

## 2018-09-07 NOTE — Telephone Encounter (Signed)
  Follow up Call-  Call back number 09/05/2018  Post procedure Call Back phone  # [phone]  Permission to leave phone message Yes  Some recent data might be hidden     Patient questions:  Do you have a fever, pain , or abdominal swelling? No. Pain Score  1 *  Have you tolerated food without any problems? Yes.    Have you been able to return to your normal activities? Yes.    Do you have any questions about your discharge instructions: Diet   No. Medications  No. Follow up visit  Yes.    Do you have questions or concerns about your Care? No.  Actions: * If pain score is 4 or above: No action needed, pain <4.  1. Have you developed a fever since your procedure? no  2.   Have you had an respiratory symptoms (SOB or cough) since your procedure? no  3.   Have you tested positive for COVID 19 since your procedure no  4.   Have you had any family members/close contacts diagnosed with the COVID 19 since your procedure?  No    If yes to any of these questions please route to Joylene John, RN and Alphonsa Gin, RN.

## 2018-09-11 ENCOUNTER — Encounter: Payer: Self-pay | Admitting: Internal Medicine

## 2018-09-11 NOTE — Progress Notes (Signed)
Benign bxs Letter sent

## 2018-09-12 LAB — SPECIMEN STATUS REPORT

## 2018-09-13 LAB — MAGNESIUM: Magnesium: 2.2 mg/dL (ref 1.6–2.3)

## 2018-09-13 LAB — SPECIMEN STATUS REPORT

## 2018-09-13 LAB — TSH+FREE T4
Free T4: 1.59 ng/dL (ref 0.82–1.77)
TSH: 1.45 u[IU]/mL (ref 0.450–4.500)

## 2018-09-29 ENCOUNTER — Encounter: Payer: Self-pay | Admitting: Internal Medicine

## 2018-09-29 ENCOUNTER — Ambulatory Visit: Payer: BC Managed Care – PPO | Admitting: Internal Medicine

## 2018-09-29 DIAGNOSIS — K641 Second degree hemorrhoids: Secondary | ICD-10-CM

## 2018-09-29 DIAGNOSIS — K219 Gastro-esophageal reflux disease without esophagitis: Secondary | ICD-10-CM

## 2018-09-29 DIAGNOSIS — K222 Esophageal obstruction: Secondary | ICD-10-CM

## 2018-09-29 NOTE — Progress Notes (Signed)
Hemorrhoid Ligation  Indications are fecal soiling and anal itching and mucus discharge did not respond to topical treatment  Prior anoscopy showed grade 2 prolapsed internal hemorrhoids in all 3 positions on September 04, 2018   Bryon Lions, CMA present  Rectal exam without mass nontender.  PROCEDURE NOTE: The patient presents with symptomatic grade 2  hemorrhoids, requesting rubber band ligation of his/her hemorrhoidal disease.  All risks, benefits and alternative forms of therapy were described and informed consent was obtained.   0.125% nitroglycerin and 5% lidocaine placed into the anal canal. The decision was made to band the right posterior internal hemorrhoid, and the [place] was used to perform band ligation without complication.  Digital anorectal examination was then performed to assure proper positioning of the band, and to adjust the banded tissue as required.  The patient was discharged home without pain or other issues.  Dietary and behavioral recommendations were given and along with follow-up instructions.      The patient will return in early October for  follow-up and possible additional banding as required. No complications were encountered and the patient tolerated the procedure well.  I appreciate the opportunity to care for this patient. CC: Kathyrn Drown, MD

## 2018-09-29 NOTE — Assessment & Plan Note (Addendum)
Banded RP today and she will return in early October for banding of 1 or 2 more columns depending upon how she does with this.  Hopefully this will stop her fecal seepage/soilage and anal itching.

## 2018-09-29 NOTE — Patient Instructions (Addendum)
HEMORRHOID BANDING PROCEDURE    FOLLOW-UP CARE   1. The procedure you have had should have been relatively painless since the banding of the area involved does not have nerve endings and there is no pain sensation.  The rubber band cuts off the blood supply to the hemorrhoid and the band may fall off as soon as 48 hours after the banding (the band may occasionally be seen in the toilet bowl following a bowel movement). You may notice a temporary feeling of fullness in the rectum which should respond adequately to plain Tylenol or Motrin.  2. Following the banding, avoid strenuous exercise that evening and resume full activity the next day.  A sitz bath (soaking in a warm tub) or bidet is soothing, and can be useful for cleansing the area after bowel movements.     3. To avoid constipation, take two tablespoons of natural wheat bran, natural oat bran, flax, Benefiber or any over the counter fiber supplement and increase your water intake to 7-8 glasses daily.    4. Unless you have been prescribed anorectal medication, do not put anything inside your rectum for two weeks: No suppositories, enemas, fingers, etc.  5. Occasionally, you may have more bleeding than usual after the banding procedure.  This is often from the untreated hemorrhoids rather than the treated one.  Don't be concerned if there is a tablespoon or so of blood.  If there is more blood than this, lie flat with your bottom higher than your head and apply an ice pack to the area. If the bleeding does not stop within a half an hour or if you feel faint, call our office at [phone] or go to the emergency room.  6. Problems are not common; however, if there is a substantial amount of bleeding, severe pain, chills, fever or difficulty passing urine (very rare) or other problems, you should call us at [phone] or report to the nearest emergency room.  7. Do not stay seated continuously for more than 2-3 hours for a day or two  after the procedure.  Tighten your buttock muscles 10-15 times every two hours and take 10-15 deep breaths every 1-2 hours.  Do not spend more than a few minutes on the toilet if you cannot empty your bowel; instead re-visit the toilet at a later time.    I appreciate the  opportunity to care for you  Thank You   Ronney Lion

## 2018-09-29 NOTE — Assessment & Plan Note (Signed)
She will continue her Nexium generic and 40 mg and in the future we will consider reducing the dose to 20 mg.  We discussed this some today.  Continue lifestyle changes.  She said that she had had stomach discomfort for a week to 10 days after the endoscopy and then it remitted and she has been fine since.  Dysphagia has resolved.

## 2018-10-02 ENCOUNTER — Other Ambulatory Visit: Payer: Self-pay | Admitting: Family Medicine

## 2018-10-05 DIAGNOSIS — M722 Plantar fascial fibromatosis: Secondary | ICD-10-CM | POA: Diagnosis not present

## 2018-10-05 DIAGNOSIS — M67871 Other specified disorders of synovium, right ankle and foot: Secondary | ICD-10-CM | POA: Diagnosis not present

## 2018-10-18 DIAGNOSIS — Z23 Encounter for immunization: Secondary | ICD-10-CM | POA: Diagnosis not present

## 2018-10-19 ENCOUNTER — Telehealth: Payer: Self-pay | Admitting: Family Medicine

## 2018-10-19 ENCOUNTER — Other Ambulatory Visit: Payer: Self-pay | Admitting: Family Medicine

## 2018-10-19 DIAGNOSIS — Z1231 Encounter for screening mammogram for malignant neoplasm of breast: Secondary | ICD-10-CM

## 2018-10-19 MED ORDER — CYCLOBENZAPRINE HCL 10 MG PO TABS: 10.0000 mg | ORAL_TABLET | Freq: Three times a day (TID) | ORAL | 2 refills | Status: DC | PRN

## 2018-10-19 NOTE — Telephone Encounter (Signed)
Pt requesting refill on cyclobenzaprine (FLEXERIL) 10 MG tablet   States she don't take them often but Dr. Nicki Reaper usually orders her some to have on hand  Please advise & call pt      Debbie Miranda

## 2018-10-19 NOTE — Telephone Encounter (Signed)
Prescription sent electronically to pharmacy. Patient notified. 

## 2018-10-19 NOTE — Telephone Encounter (Signed)
Ok same as before plus 2 ref

## 2018-10-20 ENCOUNTER — Encounter: Payer: Self-pay | Admitting: Internal Medicine

## 2018-10-20 ENCOUNTER — Ambulatory Visit: Payer: BC Managed Care – PPO | Admitting: Internal Medicine

## 2018-10-20 DIAGNOSIS — L290 Pruritus ani: Secondary | ICD-10-CM | POA: Diagnosis not present

## 2018-10-20 DIAGNOSIS — K641 Second degree hemorrhoids: Secondary | ICD-10-CM | POA: Diagnosis not present

## 2018-10-20 NOTE — Assessment & Plan Note (Signed)
See if banding helps Calmoseptine prn and qhs also

## 2018-10-20 NOTE — Progress Notes (Signed)
    HEMORRHOID BANDING  Symptoms are anal itching some fecal leakage previous banding 09/29/2018    PROCEDURE NOTE: The patient presents with symptomatic grade 2 hemorrhoids, requesting rubber band ligation of his/her hemorrhoidal disease.  All risks, benefits and alternative forms of therapy were described and informed consent was obtained.   The anorectum was pre-medicated with 0.125% NTG and 5% lidocaine The decision was made to band the RA and LL internal hemorrhoids, and the [place] was used to perform band ligation without complication.  Digital anorectal examination was then performed to assure proper positioning of the band, and to adjust the banded tissue as required.  The patient was discharged home without pain or other issues.  Dietary and behavioral recommendations were given and along with follow-up instructions.     The following adjunctive treatments were recommended:  Calmoseptine qhs and prn as that did help before but she did not like taking it frequently  The patient will return in 6 weeks for  follow-up and possible additional banding as required. No complications were encountered and the patient tolerated the procedure well.  I appreciate the opportunity to care for this patient. CC: Kathyrn Drown, MD

## 2018-10-20 NOTE — Patient Instructions (Signed)
HEMORRHOID BANDING PROCEDURE    FOLLOW-UP CARE   1. The procedure you have had should have been relatively painless since the banding of the area involved does not have nerve endings and there is no pain sensation.  The rubber band cuts off the blood supply to the hemorrhoid and the band may fall off as soon as 48 hours after the banding (the band may occasionally be seen in the toilet bowl following a bowel movement). You may notice a temporary feeling of fullness in the rectum which should respond adequately to plain Tylenol or Motrin.  2. Following the banding, avoid strenuous exercise that evening and resume full activity the next day.  A sitz bath (soaking in a warm tub) or bidet is soothing, and can be useful for cleansing the area after bowel movements.     3. To avoid constipation, take two tablespoons of natural wheat bran, natural oat bran, flax, Benefiber or any over the counter fiber supplement and increase your water intake to 7-8 glasses daily.    4. Unless you have been prescribed anorectal medication, do not put anything inside your rectum for two weeks: No suppositories, enemas, fingers, etc.  5. Occasionally, you may have more bleeding than usual after the banding procedure.  This is often from the untreated hemorrhoids rather than the treated one.  Don't be concerned if there is a tablespoon or so of blood.  If there is more blood than this, lie flat with your bottom higher than your head and apply an ice pack to the area. If the bleeding does not stop within a half an hour or if you feel faint, call our office at [phone] or go to the emergency room.  6. Problems are not common; however, if there is a substantial amount of bleeding, severe pain, chills, fever or difficulty passing urine (very rare) or other problems, you should call us at [phone] or report to the nearest emergency room.  7. Do not stay seated continuously for more than 2-3 hours for a day or two  after the procedure.  Tighten your buttock muscles 10-15 times every two hours and take 10-15 deep breaths every 1-2 hours.  Do not spend more than a few minutes on the toilet if you cannot empty your bowel; instead re-visit the toilet at a later time.    Please try using the calmoseptine at bedtime.   We will see you back on 12/12/2018 at 11:10AM.   I appreciate the opportunity to care for you. Silvano Rusk, MD, [facility]

## 2018-10-20 NOTE — Assessment & Plan Note (Signed)
Banded RA and LL RTC late November

## 2018-10-25 ENCOUNTER — Ambulatory Visit: Payer: BC Managed Care – PPO | Admitting: Cardiology

## 2018-10-25 ENCOUNTER — Encounter: Payer: Self-pay | Admitting: Cardiology

## 2018-10-25 ENCOUNTER — Other Ambulatory Visit: Payer: Self-pay

## 2018-10-25 ENCOUNTER — Encounter (INDEPENDENT_AMBULATORY_CARE_PROVIDER_SITE_OTHER): Payer: Self-pay

## 2018-10-25 VITALS — BP 132/90 | HR 62 | Ht 68.0 in | Wt 256.0 lb

## 2018-10-25 DIAGNOSIS — E782 Mixed hyperlipidemia: Secondary | ICD-10-CM | POA: Diagnosis not present

## 2018-10-25 DIAGNOSIS — I10 Essential (primary) hypertension: Secondary | ICD-10-CM

## 2018-10-25 DIAGNOSIS — I313 Pericardial effusion (noninflammatory): Secondary | ICD-10-CM

## 2018-10-25 DIAGNOSIS — R0600 Dyspnea, unspecified: Secondary | ICD-10-CM | POA: Diagnosis not present

## 2018-10-25 DIAGNOSIS — I3139 Other pericardial effusion (noninflammatory): Secondary | ICD-10-CM

## 2018-10-25 DIAGNOSIS — R0609 Other forms of dyspnea: Secondary | ICD-10-CM

## 2018-10-25 NOTE — Progress Notes (Signed)
Cardiology Office Note:    Date:  10/25/2018   ID:  Debbie Miranda, DOB [birth year], MRN [id number]  PCP:  Kathyrn Drown, MD  Cardiologist:  Candee Furbish, MD  Electrophysiologist:  None   Referring MD: Kathyrn Drown, MD   No chief complaint on file. Here for evaluation of pericardial effusion at the request of Dr. Wolfgang Phoenix  History of Present Illness:    Debbie Miranda is a 63 y.o. female here for the evaluation of pericardial effusion at the request of Dr. Wolfgang Phoenix.  Echocardiogram 08/31/2018:   1. The left ventricle has normal systolic function, with an ejection fraction of 55-60%. The cavity size was normal. There is mild asymmetric left ventricular hypertrophy. Left ventricular diastolic Doppler parameters are consistent with  pseudonormalization.  2. The right ventricle has normal systolic function. The cavity was normal. There is no increase in right ventricular wall thickness.  3. Trivial pericardial effusion is present.  4. Moderately thickened tricuspid valve leaflets.  5. The mitral valve is grossly normal.  6. No stenosis of the aortic valve.  7. The aorta is normal unless otherwise noted.  8. The aortic root and ascending aorta are normal in size and structure.  9. The atrial septum is grossly normal.  Son is MD. ENT  Wife Therapist, nutritional in [place].  In review of phone note from 08/18/2018 there was an echocardiogram performed because of diagnosis pericardial effusion.  We were then consulted.  She has what appeared to be ischemic colitis.  She was previously diagnosed with a area of pericardial effusion which was minimal at best some shortness of breath when walking up stairs and some swelling in ankles.  Deconditioning.  No chest pressure.  This was diagnosed on a CT scan of the abdomen.  08/09/2018 effusion was noted.   Past Medical History:  Diagnosis Date  . Acute ischemic colitis ([place]) 08/09/2018  . Anal itching 10/27/2011  . Arthritis   .  Cancer ([place])    lung-7years ago  . 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 Cataract    beginning stages,bilateral  . Contact lens/glasses fitting    wares contacts or glasses  . Corn of toe 5th right 08/16/2012  . Costochondritis   . Diverticulitis 2012  . Esophageal reflux   . External hemorrhoids without mention of complication [id number]  . GERD with stricture 05/16/2012  . Headache(784.0)   . HTN (hypertension) 08/09/2018  . Hyperlipidemia   . Hypertension    pt denies  . Hypothyroidism   . IFG (impaired fasting glucose)   . Impaired fasting glucose 05/16/2012  . Mycotic toenails 12/10/2010   1st toenail right-lasered nail 12/10/2010-present (last on 06/15/2012)  . Neuropathic pain 06/30/2016  . Obesity   . Personal history of colonic polyps 07/30/2015   SSA  . PONV (postoperative nausea and vomiting)   . Positive H. pylori test   . Thyroid nodule 05/16/2012   Benign, 2013, [place]   . Urge incontinence 07/05/2018  . Venous insufficiency of left leg 03/29/2016    Past Surgical History:  Procedure Laterality Date  . ABDOMINAL HYSTERECTOMY    . APPENDECTOMY    . COLONOSCOPY    . ESOPHAGOGASTRODUODENOSCOPY    . EXCISION HAGLUND'S DEFORMITY WITH ACHILLES TENDON REPAIR Right 06/07/2013   Procedure: RIGHT ACHILLES DEBRIDEMENT; [id number] EXCISION;  Surgeon: Wylene Simmer, MD;  Location: [place];  Service: Orthopedics;  Laterality: Right;  . EXCISION OF SKIN TAG  11/18/2011  Procedure: EXCISION OF SKIN TAG;  Surgeon: Leighton Ruff, MD;  Location: [facility];  Service: General;  Laterality: N/A;  posterior  . GASTROCNEMIUS RECESSION Right 06/07/2013   Procedure: GASTROCNEMIUS RECESSION;  Surgeon: Wylene Simmer, MD;  Location: [place];  Service: Orthopedics;  Laterality: Right;  . HEMORRHOID SURGERY  11/18/2011   Procedure: HEMORRHOIDECTOMY;  Surgeon: Leighton Ruff, MD;  Location: [facility];  Service: General;;  . LEFT  OOPHORECTOMY    . LUNG LOBECTOMY  12/2011   [place] removed for carcinoid  . PLANTAR FASCIA SURGERY    . rectal fissure repair    . THYROID SURGERY     nodule and partial thyroidectomy  . TONSILLECTOMY      Current Medications: Current Meds  Medication Sig  . aspirin 81 MG tablet Take 81 mg by mouth daily.  . Biotin 5000 MCG CAPS Take 5,000 mcg by mouth daily with breakfast.  . Cholecalciferol (VITAMIN D-3) 25 MCG (1000 UT) CAPS Take 1,000 Units by mouth daily with breakfast.   . cyclobenzaprine (FLEXERIL) 10 MG tablet Take 1 tablet (10 mg total) by mouth 3 (three) times daily as needed for muscle spasms.  Marland Kitchen esomeprazole (NEXIUM) 40 MG capsule Take 1 capsule (40 mg total) by mouth daily before breakfast.  . levothyroxine (SYNTHROID) 125 MCG tablet Take 125 mcg by mouth daily.  . Multiple Vitamins-Minerals (CENTRUM SILVER) tablet Take 1 tablet by mouth daily with breakfast.   . MYRBETRIQ 25 MG TB24 tablet TAKE ONE (1) TABLET BY MOUTH EACH DAY  . naproxen sodium (ALEVE) 220 MG tablet Take 220-440 mg by mouth as needed (for pain or headaches).   . rosuvastatin (CRESTOR) 10 MG tablet TAKE ONE TABLET BY MOUTH EVERY DAY.     Allergies:   Codeine   Social History   Socioeconomic History  . Marital status: Married    Spouse name: Not on file  . Number of children: 1  . Years of education: Not on file  . Highest education level: Not on file  Occupational History  . Occupation: homemaker  Social Needs  . Financial resource strain: Not on file  . Food insecurity    Worry: Not on file    Inability: Not on file  . Transportation needs    Medical: Not on file    Non-medical: Not on file  Tobacco Use  . Smoking status: Never Smoker  . Smokeless tobacco: Never Used  Substance and Sexual Activity  . Alcohol use: Yes    Alcohol/week: 0.0 standard drinks    Comment: wine-occ  . Drug use: No  . Sexual activity: Yes    Birth control/protection: Surgical  Lifestyle  .  Physical activity    Days per week: Not on file    Minutes per session: Not on file  . Stress: Not on file  Relationships  . Social Herbalist on phone: Not on file    Gets together: Not on file    Attends religious service: Not on file    Active member of club or organization: Not on file    Attends meetings of clubs or organizations: Not on file    Relationship status: Not on file  Other Topics Concern  . Not on file  Social History Narrative   She is married, she is a homemaker, she has 1 son who is a Sport and exercise psychologist, he is an Engineer, mining in [place]   Very occasional  alcohol never smoker no tobacco no drug use     Family History: The patient's family history includes Breast cancer in her maternal grandmother; Cancer in her maternal grandmother; Heart disease in her maternal grandfather; Kidney cancer in her father. There is no history of Colon cancer, Colon polyps, Esophageal cancer, Rectal cancer, or Stomach cancer.  ROS:   Please see the history of present illness.    Nausea vomiting syncope bleeding all other systems reviewed and are negative.  EKGs/Labs/Other Studies Reviewed:    The following studies were reviewed today: CT scan of abdomen personally viewed-small/trivial pericardial fluid noted inferiorly.  Echocardiogram personally reviewed 08/31/2018-trivial pericardial effusion.  No evidence of constriction or restriction.  Normal left atrial sizes.  Normal EF.  No evidence of infarct.  EKG:  EKG is  ordered today.  The ekg ordered today demonstrates sinus rhythm 62 possible inferior infarct pattern.  Echocardiogram does not show any evidence of inferior infarct.  Recent Labs: 08/09/2018: ALT 20 08/24/2018: BUN 12; Creatinine, Ser 0.76; Hemoglobin 14.6; Magnesium 2.2; Platelets 182; Potassium 4.1; Sodium 140; TSH 1.450  Recent Lipid Panel    Component Value Date/Time   CHOL 160 06/16/2018 0803   TRIG 95 06/16/2018 0803   HDL 65  06/16/2018 0803   CHOLHDL 2.5 06/16/2018 0803   CHOLHDL 3.1 07/16/2013 0705   VLDL 18 07/16/2013 0705   LDLCALC 76 06/16/2018 0803    Physical Exam:    VS:  BP 132/90   Pulse 62   Ht 5\' 8"  (1.727 m)   Wt 256 lb (116.1 kg)   SpO2 99%   BMI 38.92 kg/m     Wt Readings from Last 3 Encounters:  10/25/18 256 lb (116.1 kg)  10/20/18 256 lb 2 oz (116.2 kg)  09/29/18 250 lb (113.4 kg)     GEN:  Well nourished, well developed in no acute distress HEENT: Normal, overweight NECK: No JVD; No carotid bruits LYMPHATICS: No lymphadenopathy CARDIAC: RRR, no murmurs, rubs, gallops RESPIRATORY:  Clear to auscultation without rales, wheezing or rhonchi  ABDOMEN: Soft, non-tender, non-distended MUSCULOSKELETAL:  No edema; No deformity  SKIN: Warm and dry NEUROLOGIC:  Alert and oriented x 3 PSYCHIATRIC:  Normal affect   ASSESSMENT:    1. Pericardial effusion   2. Essential hypertension   3. Mixed hyperlipidemia   4. Dyspnea on exertion    PLAN:    In order of problems listed above:  Trivial pericardial effusion -Incidental finding on abdominal CT in the setting of colitis -Echocardiogram does demonstrate a trivial effusion in the inferior region.  This should be of no clinical consequence to her.  She is not feeling any fevers or chest discomfort.  She has no signs of pericardial constriction or restriction.  Left atrial dimensions are normal.  Overall reassurance.  Sometimes trivial effusions remain lifelong.  This could be associated with her prior bout of colitis/inflammation.  No evidence of inferior infarct as was possibly notated on ECG on echocardiogram.  LDL 76.  Weight loss efforts.  Decrease carbohydrates.  Shortness of breath -Agree that this may be secondary to conditioning.  Continue to encourage treadmill use, she is doing this about 30 minutes a day.  If this begins to be worsened or more worrisome, she will let me know, we will have low threshold for coronary CT at that  time.  Echocardiogram overall reassuring.  No early family history of CAD.  No reports of any palpitations.  Non-smoker.  Hyperlipidemia -Continue with Crestor.  We will see her back on as-needed basis.  Medication Adjustments/Labs and Tests Ordered: Current medicines are reviewed at length with the patient today.  Concerns regarding medicines are outlined above.  Orders Placed This Encounter  Procedures  . EKG 12-Lead   No orders of the defined types were placed in this encounter.   Patient Instructions  Medication Instructions:  No changes If you need a refill on your cardiac medications before your next appointment, please call your pharmacy.   Lab work: none If you have labs (blood work) drawn today and your tests are completely normal, you will receive your results only by: Marland Kitchen MyChart Message (if you have MyChart) OR . A paper copy in the mail If you have any lab test that is abnormal or we need to change your treatment, we will call you to review the results.  Testing/Procedures: none  Follow-Up: As needed with Dr. Marlou Porch  Any Other Special Instructions Will Be Listed Below (If Applicable).       Signed, Candee Furbish, MD  10/25/2018 10:18 AM    [place]

## 2018-10-25 NOTE — Patient Instructions (Signed)
Medication Instructions:  No changes If you need a refill on your cardiac medications before your next appointment, please call your pharmacy.   Lab work: none If you have labs (blood work) drawn today and your tests are completely normal, you will receive your results only by: Marland Kitchen MyChart Message (if you have MyChart) OR . A paper copy in the mail If you have any lab test that is abnormal or we need to change your treatment, we will call you to review the results.  Testing/Procedures: none  Follow-Up: As needed with Dr. Marlou Porch  Any Other Special Instructions Will Be Listed Below (If Applicable).

## 2018-11-23 DIAGNOSIS — M25512 Pain in left shoulder: Secondary | ICD-10-CM | POA: Diagnosis not present

## 2018-12-02 DIAGNOSIS — M25512 Pain in left shoulder: Secondary | ICD-10-CM | POA: Diagnosis not present

## 2018-12-11 ENCOUNTER — Telehealth: Payer: Self-pay | Admitting: Family Medicine

## 2018-12-11 NOTE — Telephone Encounter (Signed)
Discussed with pt

## 2018-12-11 NOTE — Telephone Encounter (Signed)
Pt and husband where around someone over the weekend who got positive results today. She would like to know what they need to do. They are not having symptoms currently but have upcoming doc appts next week and want to know what they should do.

## 2018-12-11 NOTE — Telephone Encounter (Signed)
So if they were around the individual for 15 minutes or longer without mask then they will need to self quarantine for 2 weeks. In regards to next week's appointments-we can do the office visits virtually or reschedule And I agree with them doing testing But they need to realize that they may potentially develop Covid symptoms anywhere up to 2 weeks after exposure

## 2018-12-11 NOTE — Telephone Encounter (Signed)
Pt and her husband around someone who tested positive for covid today. They were around that person Thursday and Friday and noone was wearing mask. Not having any symptoms. Advised pt and her husband to go [facility] test done at [facility] between 10 - 3 and added pt's to our list to follow up on for results on Thursday.

## 2018-12-12 ENCOUNTER — Ambulatory Visit: Payer: BC Managed Care – PPO | Admitting: Internal Medicine

## 2018-12-12 ENCOUNTER — Other Ambulatory Visit: Payer: Self-pay

## 2018-12-12 DIAGNOSIS — Z20822 Contact with and (suspected) exposure to covid-19: Secondary | ICD-10-CM

## 2018-12-13 LAB — NOVEL CORONAVIRUS, NAA: SARS-CoV-2, NAA: NOT DETECTED

## 2018-12-20 DIAGNOSIS — M7542 Impingement syndrome of left shoulder: Secondary | ICD-10-CM | POA: Diagnosis not present

## 2018-12-20 DIAGNOSIS — S43432D Superior glenoid labrum lesion of left shoulder, subsequent encounter: Secondary | ICD-10-CM | POA: Diagnosis not present

## 2018-12-20 DIAGNOSIS — M7502 Adhesive capsulitis of left shoulder: Secondary | ICD-10-CM | POA: Diagnosis not present

## 2018-12-20 DIAGNOSIS — M25512 Pain in left shoulder: Secondary | ICD-10-CM | POA: Diagnosis not present

## 2018-12-21 ENCOUNTER — Other Ambulatory Visit: Payer: Self-pay | Admitting: Family Medicine

## 2018-12-25 ENCOUNTER — Telehealth: Payer: Self-pay | Admitting: Internal Medicine

## 2018-12-25 ENCOUNTER — Telehealth: Payer: Self-pay

## 2018-12-25 MED ORDER — ESOMEPRAZOLE MAGNESIUM 20 MG PO CPDR: 20.0000 mg | DELAYED_RELEASE_CAPSULE | Freq: Every day | ORAL | 5 refills | Status: DC

## 2018-12-25 NOTE — Telephone Encounter (Signed)
Pt needs rf for esomeprazole, she stated that Dr. Carlean Purl was going to reduce dose to 20 mg. Her pharmacy is [facility].

## 2018-12-25 NOTE — Telephone Encounter (Signed)
According to the 09/29/2018 visit note Dr Carlean Purl discussed going to the 20mg  Nexium so I have sent that in today as patient requested.

## 2018-12-25 NOTE — Telephone Encounter (Signed)
Prior Authorization has been sent to [employer] for patients Nexium 20 mg capsules. Dx- GERD K22.2.

## 2018-12-26 NOTE — Telephone Encounter (Signed)
We received a fax from [facility] stating that patient' s Nexium 20mg  has been approved.

## 2018-12-29 ENCOUNTER — Other Ambulatory Visit: Payer: Self-pay

## 2018-12-29 ENCOUNTER — Ambulatory Visit
Admission: RE | Admit: 2018-12-29 | Discharge: 2018-12-29 | Disposition: A | Discharge status: Home / Self Care | Payer: BC Managed Care – PPO | Source: Ambulatory Visit | Attending: Family Medicine | Admitting: Family Medicine

## 2018-12-29 DIAGNOSIS — Z1231 Encounter for screening mammogram for malignant neoplasm of breast: Secondary | ICD-10-CM

## 2019-01-15 DIAGNOSIS — D485 Neoplasm of uncertain behavior of skin: Secondary | ICD-10-CM | POA: Diagnosis not present

## 2019-01-15 DIAGNOSIS — D2262 Melanocytic nevi of left upper limb, including shoulder: Secondary | ICD-10-CM | POA: Diagnosis not present

## 2019-01-15 DIAGNOSIS — D2261 Melanocytic nevi of right upper limb, including shoulder: Secondary | ICD-10-CM | POA: Diagnosis not present

## 2019-01-15 DIAGNOSIS — L218 Other seborrheic dermatitis: Secondary | ICD-10-CM | POA: Diagnosis not present

## 2019-01-15 DIAGNOSIS — Z85828 Personal history of other malignant neoplasm of skin: Secondary | ICD-10-CM | POA: Diagnosis not present

## 2019-01-15 DIAGNOSIS — L738 Other specified follicular disorders: Secondary | ICD-10-CM | POA: Diagnosis not present

## 2019-01-19 ENCOUNTER — Ambulatory Visit: Payer: BC Managed Care – PPO | Admitting: Internal Medicine

## 2019-01-22 DIAGNOSIS — L988 Other specified disorders of the skin and subcutaneous tissue: Secondary | ICD-10-CM | POA: Diagnosis not present

## 2019-01-22 DIAGNOSIS — D485 Neoplasm of uncertain behavior of skin: Secondary | ICD-10-CM | POA: Diagnosis not present

## 2019-01-27 ENCOUNTER — Other Ambulatory Visit: Payer: Self-pay | Admitting: Family Medicine

## 2019-02-13 ENCOUNTER — Ambulatory Visit: Payer: BC Managed Care – PPO | Admitting: Internal Medicine

## 2019-02-13 ENCOUNTER — Encounter: Payer: Self-pay | Admitting: Internal Medicine

## 2019-02-13 VITALS — BP 116/70 | HR 76 | Temp 98.3°F | Ht 67.75 in | Wt 260.5 lb

## 2019-02-13 DIAGNOSIS — R194 Change in bowel habit: Secondary | ICD-10-CM

## 2019-02-13 DIAGNOSIS — K641 Second degree hemorrhoids: Secondary | ICD-10-CM | POA: Diagnosis not present

## 2019-02-13 DIAGNOSIS — R151 Fecal smearing: Secondary | ICD-10-CM

## 2019-02-13 DIAGNOSIS — K219 Gastro-esophageal reflux disease without esophagitis: Secondary | ICD-10-CM

## 2019-02-13 DIAGNOSIS — N3941 Urge incontinence: Secondary | ICD-10-CM

## 2019-02-13 DIAGNOSIS — K222 Esophageal obstruction: Secondary | ICD-10-CM

## 2019-02-13 DIAGNOSIS — L290 Pruritus ani: Secondary | ICD-10-CM | POA: Diagnosis not present

## 2019-02-13 NOTE — Patient Instructions (Addendum)
Please increase citrucel as we discussed  I think pelvic floor PT will help you with your urinary and bowel symptoms.  Let me know if you want to try it.  In the meantime Kegel exercises will help, too.  I appreciate the opportunity to care for you. Gatha Mayer, MD, [facility]       PRURITUS ANI   OVERVIEW  Pruritus ani is a common medical problem affecting both men and women. This information was composed to help patients understand pruritus ani, its symptoms, evaluation, and treatment options.  This information may also be helpful to individuals or caregivers of patients who are suffering from pruritus ani.  Pruritus ani most commonly affects adults, affecting from 1% to 5% of people in the general population. Men are more commonly affected than women with a 4:1 ratio. The condition is most common in people age 75s to 45s.  There are many causes of pruritis ani, and an accurate diagnosis is important in order to treat the specific cause.  Medical management of pruritis ani often provides patients with relief of their symptoms and improves their quality of life.  WHAT IS PRURITUS ANI?  Pruritus ani is a Latin term meaning "itchy anus" and is defined as an unpleasant sensation of the skin around the anus (i.e., rectal opening) that produces the desire to scratch. Pruritus ani is classified as primary or secondary. The primary form is the classic syndrome which may not have an identifiable cause (referred to as "idiopathic") and the secondary form has an identifiable, and often specifically treatable, cause.  Minimal stimulation of the skin may cause itching. The subsequent scratching may cause injury to the skin which produces a larger area of irritated skin. Continued scratching causes the need to scratch more, making the problem worse.  WHAT CAUSES PRURITUS ANI?  This symptom of pruritus or itching is common to many anorectal conditions.  One must consider hemorrhoids, excessive skin  tags, fecal soilage or incontinence, anal fistulae (abnormal passageways between the bowel and an organ or skin surface), anal fissures (painful clefts or grooves) and anal warts as possible causative agents.  It is not always understood what causes the long-standing history of primary pruritis ani. It is believed that an irritating secretion from the anal canal may cause the itching. The local nerve fibers in the skin may become chronically active with repetitive trauma or scratching for prolonged periods of time. There can also be itching related to disorders of nerve pathways or itching related to a central nervous system stimulus such as medications. Occasionally, itching may also be psychogenic (symptoms arise from the mind, as opposed to another organ).  Other potential causes of irritation include moisture from sweat, stool and mucus.  Studies have shown that the relief of symptoms can occur promptly after the stool has been cleansed from the perianal area, indicating that stool is likely an irritant causing of itching. In addition to difficult or inadequate hygiene, overzealous or aggressive hygiene with the use of many irritating soaps, scents, and lotions may cause pruritis ani, resulting in this condition occasionally being referred to as "polished anus syndrome."  Overzealous cleaning, in addition to the use of topical steroids, can destroy natural skin barriers and cause trauma to the anal skin, making the problem worse.  In a way, trying to keep it "too clean" may worsen the problem.  Dietary factors may also play a role with pruritis ani, although there are not definitive studies implicating particular food items or diets.  Coffee, either caffeinated or decaffeinated, is thought to be a major contributing factor. Coffee consumption may lower the anal resting pressure (normal strength of muscle contraction at rest) and contribute to anal leakage of stool. Other dietary agents which are possible  causes of pruritis ani include tea, cola, energy drinks, chocolate, citrus fruits, tomatoes, spicy foods, beer, dairy products and nuts.        TREATMENT OF PRURITUS ANI  The goal of therapy is to restore clean, dry, and intact skin. Treatment can be challenging, as many cases have no clear identifiable cause.  It is important to use bowel medications to thicken stool and create a formed bowel movement to minimize leakage or seepage and also to allow for complete evacuation.  The goal is a soft, bulky, easy to clean stool. Most people can benefit from taking a fiber supplement (Citrucel, Metamucil, Fibercon, Benefiber, and Konsyl are examples). This can be taken in powder or capsule/tablet form and is usually taken once or twice daily. The fiber serves to absorb the moisture from the stool, adding bulk and allowing for complete evacuation of stool during bowel movements.  If stools still remain loose, additional medications may be helpful. Imodium is an antidiarrheal medication which can thicken or firm stool and help decrease seepage. In more difficult cases, prescription medications such as Lomotil may be needed to thicken the stool. Your physician can help decide which medications may be best for you.  Dietary changes are often necessary for treatment.  There are several common foods which may be related to pruritus ani.  These foods and beverages include coffee, colas, tea, chocolate, tomatoes and beer.  These items may possibly decrease your sphincter tone which can cause some seepage or leakage.  Avoiding overuse of these items may improve symptoms.  It may be helpful to remove one item at a time from your diet for several weeks.  If your symptoms improve, you could try reintroduction of the item in smaller volume and see if there is a limit to which you may have that item without producing symptoms.  It will also be important to modify bowel hygiene or cleaning habits. It must be  stressed that the anus does not need to be scrubbed or sterilized. Cleaning with plain water rinses is quite helpful. Soaps, perfumes, dyes in tissue or clothing, and baby wipes containing deodorants should be avoided because they can act as irritants. Alcohol and witch hazel agents should similarly be avoided. Bathing with Dove soap is recommended, as it is free of conventional soap. Also, handheld detachable shower heads can be used to clean and wash away any remaining soap residue.  The same effect can also be created with a bidet, although they are not common in the [place]  Balneol is a gentle and soothing cleaning agent.  It is commercially available mineral oil-based preparation that can be used at home or taken along in a pocket or a purse for use in public facilities. Another possible cleaning agent is dilute white vinegar. One tablespoon in an 8 ounce glass of water can be kept in the bathroom and applied with a cotton ball. Burrow's solution, diluted at 1:40 (one [place] tablet in 12 ounces of water or one tablet in six ounces of water for 1:20 solution) is also a gentle and non-irritating cleanser. It can be kept in a plastic squeeze bottle in the refrigerator and used in place of soap and water.  The ultimate goal of treatment is to create dry,  healthy, and intact skin. The skin can be dried after cleansing using a hair dryer on low setting. An athlete's foot powder or Zeasorb, a lubricating and drying agent in powder form can also be used to absorb moisture. After drying, the athlete's foot powder or Zeasorb can be applied, and a small piece of cotton can be placed between the buttocks and against the anus to help absorb the moisture. Tight fitting, synthetic undergarments should be avoided.  One of the most important, but often most difficult, aspects of the management of pruritis ani, is to avoid trauma to the skin. This means no scratching with hands or dry toilet paper. Behavioral  modification is often very difficult to achieve, due to the intense desire to scratch. Many people also scratch during sleep and are not aware of it until they wake to find themselves scratching. It is often recommended to have patients cut their nails and wear a pair of light, soft, cotton gloves on their hands at night so they are not able to scratch.  In order to control symptoms, a short course of a steroid ointment may be tried. A weak topical steroid such as 1% hydrocortisone cream used two to three times a day for a short period of time can be effective in relieving symptoms of pruritis. A long-acting topical steroid such as betamethasone may also be effective. Strong steroids or prolonged use can lead to skin atrophy (weakness and thinning) which sometimes worsens pruritus ani. High potency steroids should not be used for more than four to eight weeks. If there is thinned or denuded skin, topical antibiotics may occasionally be helpful. It should be noted that cream forms of medication cause more thinning or atrophy than ointment forms.  A skin barrier cream such as zinc oxide may also be helpful in protecting the skin around the anus from irritants. Additional topical agents such as numbing medications, menthol, phenol, camphor, or a combination of them may be helpful. Calmoseptine is used frequently, with a combination of zinc oxide and menthol and can be very beneficial at relieving patients' symptoms. If there is any concern that there may be an infection, topical antibiotics (gentamicin, clindamycin, or bacitracin) or antifungals (clotrimazole, nystatin) may be added in conjunction with other therapies. They can be applied at nighttime before bed and again in the morning after bathing.  Patients coming to the doctor for evaluation of pruritus ani with moderate to severe changes of the skin, may be treated by application of Berwick's dye (which is a combination of gentian violet and brilliant  green pigment) with alcohol. It can relieve itching but will sting if there are open wounds. Your physician can dry the dye with a hair dryer and it can be sealed in place with Benzoin tincture and then again dried in place. This dye can stay in place for several days and will often give great relief while the skin is able to regenerate or re-epithelialize. This treatment is performed in an office setting, but is not used in the home setting.  You may notice that your problems will improve for some time with treatment but then recur.  For a small number of patients, pruritus ani can be quite difficult to manage, and it may be difficult to completely relieve their symptoms.  In these patients, it may be beneficial to try topical capsaicin. Capsaicin comes from Capsicum chili peppers.  It is believed to work by depressing the feelings or desensitizing certain nerves.  This medicine has been  studied with a small number of patients, and up to 70% of patients had relief of their symptoms for up to almost 11 months.  The medicine is applied with a very low concentration of 0.006%.   A very small number of patients find only minimal relief from all attempted treatment options.  These individuals may benefit from injectable therapy.  This particular therapy is saved for patients with persistent and intense pruritus ani.  Methylene blue is a dye which can be injected into the skin and may relieve symptoms by causing destruction of the nerve endings.  The methylene blue can be mixed with topical anesthetics and injected into and below the affected perianal region.  Many patients do experience a change in sensation in the injected area.  It may feel somewhat numb like a local anesthetic for a dental procedure.  It also will turn the skin in the area blue.  In very rare cases, this may be injected too close to the surface of the skin and may cause some skin breakdown or ulcerations.

## 2019-02-13 NOTE — Progress Notes (Signed)
Debbie Miranda 64 y.o. 1955/10/25 [id number]  Assessment & Plan:   Encounter Diagnoses  Name Primary?  . Pruritus ani Yes  . Prolapsed internal hemorrhoids, grade 2   . GERD with stricture   . Change in bowel habits   . Urge urinary incontinence   . Fecal smearing    Unfortunately the hemorrhoidal banding did not help her pruritus ani which she was aware of and is excepting.  She feels like calmoseptine is a nuisance to use but it does help.  I am not sure why her bowel habits changed after the hemorrhoidal banding I have not experienced that in this pattern with people before usually banding prevents the type of issue she is having.  Fortunately Citrucel is helping.  My recommendations to her were the following:  Increase citrucel to help form her bowel movements and further improve her frequent incomplete defecation issues I think pelvic floor physical therapy would certainly help her she is not inclined to pursue that.  It could help both her bowel habits and the urinary issues.  Kegel exercises are appropriate at least.  I have given her more information about pruritus a night and offered referral to colorectal surgery not for surgery but to see what they might be able to help with otherwise.  I appreciate the opportunity to care for this patient. CC: Kathyrn Drown, MD   Subjective:   Chief Complaint: Change in bowel habits follow-up of pruritus a 9  HPI [name] is here to follow-up, she had grade 2 prolapsed internal hemorrhoids that I banded in an effort to try to help pruritus a night.  Since that time in October she had to delay follow-up due to a potential Covid exposure, and in December or so she noticed to having more frequent stools perhaps even before that.  She relates this to the hemorrhoidal banding where she will have more frequent stool in the mornings and have incomplete defecation.  She is also had some fecal smearing.  Pruritus a night persists though  calmoseptine helps a fair amount.  She is not having dysphagia she has had GERD with stricture issues, I dilated with esophageal stricture last year and she went from 40 to 20 mg of Nexium generic.  She is on the over-the-counter brand which I told her is fine.  She is doing well on this.  She started taking some Citrucel 1 capsule in the afternoons and has noted some benefit with respect to her bowel movements.  No medication changes otherwise.  She also has urge urinary incontinence.  She tried Myrbetriq but it did not help.  She has not seen a urologist yet.  She tells me she knows about Kegel exercises.  She has never had pelvic floor physical therapy. Allergies  Allergen Reactions  . Codeine Nausea And Vomiting   Current Meds  Medication Sig  . aspirin 81 MG tablet Take 81 mg by mouth daily.  . Biotin 5000 MCG CAPS Take 5,000 mcg by mouth daily with breakfast.  . Cholecalciferol (VITAMIN D-3) 25 MCG (1000 UT) CAPS Take 1,000 Units by mouth daily with breakfast.   . clobetasol (TEMOVATE) 0.05 % external solution Apply 1 application topically as needed.  . cyclobenzaprine (FLEXERIL) 10 MG tablet Take 1 tablet (10 mg total) by mouth 3 (three) times daily as needed for muscle spasms.  . Esomeprazole Magnesium (NEXIUM 24HR) 20 MG TBEC Take 1 tablet by mouth daily.  Marland Kitchen levothyroxine (SYNTHROID) 125 MCG tablet TAKE ONE  TABLET BY MOUTH EVERY MORNING  . Methylcellulose, Laxative, (CITRUCEL) 500 MG TABS Take 1 tablet by mouth daily.  . Multiple Vitamins-Minerals (CENTRUM SILVER) tablet Take 1 tablet by mouth daily with breakfast.   . naproxen sodium (ALEVE) 220 MG tablet Take 220-440 mg by mouth as needed (for pain or headaches).   . rosuvastatin (CRESTOR) 10 MG tablet TAKE ONE (1) TABLET BY MOUTH EACH DAY   Past Medical History:  Diagnosis Date  . Acute ischemic colitis ([place]) 08/09/2018  . Anal itching 10/27/2011  . Arthritis   . Cancer ([place])    lung-7years ago  . Carcinoid tumor of lung 2013     Left  . Cataract    beginning stages,bilateral  . Contact lens/glasses fitting    wares contacts or glasses  . Corn of toe 5th right 08/16/2012  . Costochondritis   . Diverticulitis 2012  . Dysplastic nevi   . Esophageal reflux   . External hemorrhoids without mention of complication [id number]  . GERD with stricture 05/16/2012  . Headache(784.0)   . HTN (hypertension) 08/09/2018  . Hyperlipidemia   . Hypertension    pt denies  . Hypothyroidism   . IFG (impaired fasting glucose)   . Impaired fasting glucose 05/16/2012  . Mycotic toenails 12/10/2010   1st toenail right-lasered nail 12/10/2010-present (last on 06/15/2012)  . Neuropathic pain 06/30/2016  . Obesity   . Personal history of colonic polyps 07/30/2015   SSA  . PONV (postoperative nausea and vomiting)   . Positive H. pylori test   . Thyroid nodule 05/16/2012   Benign, 2013, [place]   . Urge incontinence 07/05/2018  . Venous insufficiency of left leg 03/29/2016   Past Surgical History:  Procedure Laterality Date  . ABDOMINAL HYSTERECTOMY    . APPENDECTOMY    . COLONOSCOPY    . ESOPHAGOGASTRODUODENOSCOPY    . EXCISION HAGLUND'S DEFORMITY WITH ACHILLES TENDON REPAIR Right 06/07/2013   Procedure: RIGHT ACHILLES DEBRIDEMENT; [id number] EXCISION;  Surgeon: Wylene Simmer, MD;  Location: [facility];  Service: Orthopedics;  Laterality: Right;  . EXCISION OF SKIN TAG  11/18/2011   Procedure: EXCISION OF SKIN TAG;  Surgeon: Leighton Ruff, MD;  Location: [facility];  Service: General;  Laterality: N/A;  posterior  . GASTROCNEMIUS RECESSION Right 06/07/2013   Procedure: GASTROCNEMIUS RECESSION;  Surgeon: Wylene Simmer, MD;  Location: [facility];  Service: Orthopedics;  Laterality: Right;  . HEMORRHOID BANDING    . HEMORRHOID SURGERY  11/18/2011   Procedure: HEMORRHOIDECTOMY;  Surgeon: Leighton Ruff, MD;  Location[facility];  Service: General;;  . LEFT OOPHORECTOMY    . LUNG  LOBECTOMY  12/2011   [place] removed for carcinoid  . PLANTAR FASCIA SURGERY    . rectal fissure repair    . THYROID SURGERY     nodule and partial thyroidectomy  . TONSILLECTOMY     Social History   Social History Narrative   She is married, she is a Agricultural engineer, she has 1 son who is a Sport and exercise psychologist, he is an Engineer, mining in [place]   Very occasional alcohol never smoker no tobacco no drug use   family history includes Breast cancer in her maternal grandmother; Cancer in her maternal grandmother; Heart disease in her maternal grandfather; Kidney cancer in her father.   Review of Systems See above  Objective:   Physical Exam BP 116/70 (BP Location: Left Arm, Patient Position: Sitting, Cuff Size: Large)  Pulse 76   Temp 98.3 F (36.8 C)   Ht 5' 7.75" (1.721 m)   Wt 260 lb 8 oz (118.2 kg)   BMI 39.90 kg/m   Inspection of the anoderm with Patti Martinique, CMA present shows it to be normal other than some tiny external tags.  I do not see any rash or scarring.

## 2019-02-19 ENCOUNTER — Ambulatory Visit: Payer: BC Managed Care – PPO | Attending: Internal Medicine

## 2019-02-19 DIAGNOSIS — Z23 Encounter for immunization: Secondary | ICD-10-CM | POA: Insufficient documentation

## 2019-02-19 NOTE — Progress Notes (Signed)
   Covid-19 Vaccination Clinic  Name:  Debbie Miranda    MRN: [id number] DOB: Jul 25, 1955  02/19/2019  [name] was observed post Covid-19 immunization for 15 minutes without incidence. She was provided with Vaccine Information Sheet and instruction to access the V-Safe system.   [name] was instructed to call 911 with any severe reactions post vaccine: Marland Kitchen Difficulty breathing  . Swelling of your face and throat  . A fast heartbeat  . A bad rash all over your body  . Dizziness and weakness    Immunizations Administered    Name Date Dose VIS Date Route   Pfizer COVID-19 Vaccine 02/19/2019  5:00 PM 0.3 mL 12/22/2018 Intramuscular   Manufacturer: [place]   Lot: [id number]   [place]: [id number]

## 2019-03-12 ENCOUNTER — Other Ambulatory Visit (HOSPITAL_COMMUNITY)
Admission: RE | Admit: 2019-03-12 | Discharge: 2019-03-12 | Disposition: A | Discharge status: Home / Self Care | Payer: BC Managed Care – PPO | Source: Ambulatory Visit | Attending: Family Medicine | Admitting: Family Medicine

## 2019-03-12 ENCOUNTER — Ambulatory Visit (INDEPENDENT_AMBULATORY_CARE_PROVIDER_SITE_OTHER): Payer: BC Managed Care – PPO | Admitting: Family Medicine

## 2019-03-12 ENCOUNTER — Ambulatory Visit (HOSPITAL_COMMUNITY)
Admission: RE | Admit: 2019-03-12 | Discharge: 2019-03-12 | Disposition: A | Discharge status: Home / Self Care | Payer: BC Managed Care – PPO | Source: Ambulatory Visit | Attending: Family Medicine | Admitting: Family Medicine

## 2019-03-12 ENCOUNTER — Other Ambulatory Visit: Payer: Self-pay

## 2019-03-12 VITALS — Wt 267.0 lb

## 2019-03-12 DIAGNOSIS — R0609 Other forms of dyspnea: Secondary | ICD-10-CM | POA: Diagnosis not present

## 2019-03-12 DIAGNOSIS — R0600 Dyspnea, unspecified: Secondary | ICD-10-CM | POA: Diagnosis not present

## 2019-03-12 LAB — BASIC METABOLIC PANEL
Anion gap: 7 (ref 5–15)
BUN: 14 mg/dL (ref 8–23)
CO2: 29 mmol/L (ref 22–32)
Calcium: 9.1 mg/dL (ref 8.9–10.3)
Chloride: 100 mmol/L (ref 98–111)
Creatinine, Ser: 0.79 mg/dL (ref 0.44–1.00)
GFR calc Af Amer: >60 mL/min (ref >60)
GFR calc non Af Amer: >60 mL/min (ref >60)
Glucose, Bld: 95 mg/dL (ref 70–99)
Potassium: 4.8 mmol/L (ref 3.5–5.1)
Sodium: 136 mmol/L (ref 135–145)

## 2019-03-12 LAB — TROPONIN I (HIGH SENSITIVITY): Troponin I (High Sensitivity): <2 ng/L (ref <18)

## 2019-03-12 LAB — D-DIMER, QUANTITATIVE: D-Dimer, Quant: 0.44 ug/mL-FEU (ref 0.00–0.50)

## 2019-03-12 NOTE — Progress Notes (Signed)
Subjective:    Patient ID: Debbie Miranda, female    DOB: 13-Feb-1955, 64 y.o.   MRN: [id number] Initially this was done via phone but the patient was brought to the office for further evaluation Wheezing  This is a new problem. Episode onset: a few months. getting worse. Associated symptoms include shortness of breath. Pertinent negatives include no abdominal pain, chest pain, coughing, diarrhea or rhinorrhea. Exacerbated by: starting out on a walk, going up steps.  The patient finds herself getting out of breath quickly walking up a incline she gets way out of breath as well as walking up steps she denies any type of chest pressure but just relates it takes her a while to recover her breath she also finds when she is talking a lot she gets short winded also She has gained some weight during this pandemic and has not been exercising but the degree of this shortness of breath is but unlike any other time in her life and is been more progressive over the past 3 months Patient did have a visit with cardiology who mention the possibility of doing a CT coronary if she has ongoing symptoms Patient denies calf pain denies any triggers for DVTs No fevers denies any persistent cough denies sweats chills fevers weight loss Has history of obesity Virtual Visit via Telephone Note  I connected with Debbie Miranda on 03/12/19 at  9:30 AM EST by telephone and verified that I am speaking with the correct person using two identifiers.  Location: Patient: home Provider: office   I discussed the limitations, risks, security and privacy concerns of performing an evaluation and management service by telephone and the availability of in person appointments. I also discussed with the patient that there may be a patient responsible charge related to this service. The patient expressed understanding and agreed to proceed.   History of Present Illness:    Observations/Objective:   Assessment and  Plan:   Follow Up Instructions:    I discussed the assessment and treatment plan with the patient. The patient was provided an opportunity to ask questions and all were answered. The patient agreed with the plan and demonstrated an understanding of the instructions.   The patient was advised to call back or seek an in-person evaluation if the symptoms worsen or if the condition fails to improve as anticipated.  I provided 30 minutes of non-face-to-face time during this encounter.  EKG no acute changes compared to previous EKG    Review of Systems  Constitutional: Negative for activity change, appetite change and fatigue.  HENT: Negative for congestion and rhinorrhea.   Respiratory: Positive for shortness of breath and wheezing. Negative for cough.   Cardiovascular: Negative for chest pain and leg swelling.  Gastrointestinal: Negative for abdominal pain and diarrhea.  Endocrine: Negative for polydipsia and polyphagia.  Skin: Negative for color change.  Neurological: Negative for dizziness and weakness.  Psychiatric/Behavioral: Negative for behavioral problems and confusion.       Objective:   Physical Exam Vitals reviewed.  Constitutional:      General: She is not in acute distress. HENT:     Head: Normocephalic and atraumatic.  Eyes:     General:        Right eye: No discharge.        Left eye: No discharge.  Neck:     Trachea: No tracheal deviation.  Cardiovascular:     Rate and Rhythm: Normal rate and regular rhythm.  Heart sounds: Normal heart sounds. No murmur.  Pulmonary:     Effort: Pulmonary effort is normal. No respiratory distress.     Breath sounds: Normal breath sounds.  Lymphadenopathy:     Cervical: No cervical adenopathy.  Skin:    General: Skin is warm and dry.  Neurological:     Mental Status: She is alert.     Coordination: Coordination normal.  Psychiatric:        Behavior: Behavior normal.   Extremities no edema calf nontender  Upon  further questioning the wheezing only occurs in certain situations such as walking up a hill that does not occur at rest      Assessment & Plan:  I doubt asthma Certainly though there is a possibility of developing lung disease as well as developing cardiac blockage  This will require aggressive investigation of multiple fronts Stat chest x-ray as well as stat lab work including D-dimer and troponin plus also consultation with pulmonary as well as consultation with cardiology.  Hopefully patient can have CT coronary in the near future Cardiac catheterization is also a possibility We will connect with her cardiologist after we get the test results back  D-dimer negative Troponin I negative Chest x-ray negative We will need to move forward expeditiously Will connect with cardiology Also on a parallel track we will go ahead with initiation of referral to pulmonary for further evaluation and pulmonary function testing but obviously if CT  shows blockages that would be the source of her issues

## 2019-03-13 ENCOUNTER — Ambulatory Visit: Payer: BC Managed Care – PPO | Attending: Internal Medicine

## 2019-03-13 ENCOUNTER — Encounter: Payer: Self-pay | Admitting: Family Medicine

## 2019-03-13 ENCOUNTER — Telehealth: Payer: Self-pay | Admitting: *Deleted

## 2019-03-13 DIAGNOSIS — R0600 Dyspnea, unspecified: Secondary | ICD-10-CM

## 2019-03-13 DIAGNOSIS — R0602 Shortness of breath: Secondary | ICD-10-CM

## 2019-03-13 DIAGNOSIS — Z23 Encounter for immunization: Secondary | ICD-10-CM | POA: Insufficient documentation

## 2019-03-13 DIAGNOSIS — R0609 Other forms of dyspnea: Secondary | ICD-10-CM

## 2019-03-13 NOTE — Telephone Encounter (Signed)
Dr Marlou Porch aware pt is scheduled for an appt with him 3/3.  He will discuss the testing and orders with her then.

## 2019-03-13 NOTE — Telephone Encounter (Signed)
Jerline Pain, MD  Shellia Cleverly, RN; Kathyrn Drown, MD  Pam,  Let's set her up for coronary CT with possible FFR - ongoing dyspnea (may be angina)  If needed, we can have her see APP on my team virtual visit to discuss.  Candee Furbish, MD       Previous Messages   ----- Message -----  From: Kathyrn Drown, MD  Sent: 03/12/2019  2:50 PM EST  To: Jerline Pain, MD  Subject: DOE                        Hi Debbie Miranda is a mutual patient. She called with significant increased shortness of breath over the past 8 to 12 weeks. She can only walk 10 minutes at a slow pace without getting severely out of breath. She notices that it is a struggle even to walk up 1 flight of steps. She describes not only shortness of breath but prolonged recovery time.   I saw her today. Her EKG is stable. D-dimer negative. Chest x-ray normal. Based on your previous note you hinted at coronary CT. Because of her progressive symptoms please see her expeditiously for further cardiac evaluation. I also initiated a consultation with pulmonary just in case cardiology work-up was negative for coronary artery disease.   I look forward to your response  Thanks-Scott

## 2019-03-13 NOTE — Progress Notes (Signed)
   Covid-19 Vaccination Clinic  Name:  Debbie Miranda    MRN: [id number] DOB: 1955-08-22  03/13/2019  [name] was observed post Covid-19 immunization for 15 minutes without incident. She was provided with Vaccine Information Sheet and instruction to access the V-Safe system.   [name] was instructed to call 911 with any severe reactions post vaccine: Marland Kitchen Difficulty breathing  . Swelling of face and throat  . A fast heartbeat  . A bad rash all over body  . Dizziness and weakness   Immunizations Administered    Name Date Dose VIS Date Route   Pfizer COVID-19 Vaccine 03/13/2019 11:15 AM 0.3 mL 12/22/2018 Intramuscular   Manufacturer: [employer]   Lot: [id number]   [place]: [id number]

## 2019-03-14 ENCOUNTER — Other Ambulatory Visit: Payer: Self-pay

## 2019-03-14 ENCOUNTER — Encounter: Payer: Self-pay | Admitting: Cardiology

## 2019-03-14 ENCOUNTER — Ambulatory Visit: Payer: BC Managed Care – PPO | Admitting: Cardiology

## 2019-03-14 VITALS — BP 130/78 | HR 84 | Ht 67.75 in | Wt 265.0 lb

## 2019-03-14 DIAGNOSIS — R0609 Other forms of dyspnea: Secondary | ICD-10-CM

## 2019-03-14 DIAGNOSIS — R0600 Dyspnea, unspecified: Secondary | ICD-10-CM

## 2019-03-14 DIAGNOSIS — Z01812 Encounter for preprocedural laboratory examination: Secondary | ICD-10-CM | POA: Diagnosis not present

## 2019-03-14 MED ORDER — METOPROLOL TARTRATE 100 MG PO TABS: 100.0000 mg | ORAL_TABLET | Freq: Once | ORAL | 0 refills | Status: DC

## 2019-03-14 NOTE — Progress Notes (Signed)
Cardiology Office Note:    Date:  03/14/2019   ID:  Debbie Miranda, DOB [birth year], MRN [id number]  PCP:  Kathyrn Drown, MD  Cardiologist:  Candee Furbish, MD  Electrophysiologist:  None   Referring MD: Kathyrn Drown, MD     History of Present Illness:    Debbie Miranda is a 64 y.o. female here for the evaluation of ongoing dyspnea on exertion at the request of Dr. Wolfgang Phoenix.  We thought it would be nice to set her up for a coronary CT with possible FFR.  Her dyspnea may be anginal symptoms. Over the past 8 to 12 weeks she has been noticing increasing shortness of breath.  Only 10 minutes walking at a slow pace she begins to really severely get short of breath.  Struggling to walk up 1 flight of stairs.  Thankfully, chest x-ray is normal EKG is stable D-dimer is negative.  Cardiogram on 08/31/2018 was also normal with normal ejection fraction.  Wheeze.   Prior history of lobectomy.    Past Medical History:  Diagnosis Date   Acute ischemic colitis ([place]) 08/09/2018   Anal itching 10/27/2011   Arthritis    Cancer ([place])    lung-7years ago   Carcinoid tumor of lung 2013   Left   Cataract    beginning stages,bilateral   Contact lens/glasses fitting    wares contacts or glasses   Corn of toe 5th right 08/16/2012   Costochondritis    Diverticulitis 2012   Dysplastic nevi    Esophageal reflux    External hemorrhoids without mention of complication [id number]   GERD with stricture 05/16/2012   Headache(784.0)    HTN (hypertension) 08/09/2018   Hyperlipidemia    Hypertension    pt denies   Hypothyroidism    IFG (impaired fasting glucose)    Impaired fasting glucose 05/16/2012   Mycotic toenails 12/10/2010   1st toenail right-lasered nail 12/10/2010-present (last on 06/15/2012)   Neuropathic pain 06/30/2016   Obesity    Personal history of colonic polyps 07/30/2015   SSA   PONV (postoperative nausea and vomiting)    Positive H. pylori test    Thyroid nodule  05/16/2012   Benign, 2013, [place]    Urge incontinence 07/05/2018   Venous insufficiency of left leg 03/29/2016    Past Surgical History:  Procedure Laterality Date   ABDOMINAL HYSTERECTOMY     APPENDECTOMY     COLONOSCOPY     ESOPHAGOGASTRODUODENOSCOPY     EXCISION HAGLUND'S DEFORMITY WITH ACHILLES TENDON REPAIR Right 06/07/2013   Procedure: RIGHT ACHILLES DEBRIDEMENT; HAGLUND EXCISION;  Surgeon: Wylene Simmer, MD;  Location: [place];  Service: Orthopedics;  Laterality: Right;   EXCISION OF SKIN TAG  11/18/2011   Procedure: EXCISION OF SKIN TAG;  Surgeon: Leighton Ruff, MD;  Location: [facility];  Service: General;  Laterality: N/A;  posterior   GASTROCNEMIUS RECESSION Right 06/07/2013   Procedure: GASTROCNEMIUS RECESSION;  Surgeon: Wylene Simmer, MD;  Location: [place];  Service: Orthopedics;  Laterality: Right;   HEMORRHOID BANDING     HEMORRHOID SURGERY  11/18/2011   Procedure: HEMORRHOIDECTOMY;  Surgeon: Leighton Ruff, MD;  Location: [facility];  Service: General;;   LEFT OOPHORECTOMY     LUNG LOBECTOMY  12/2011   [place] removed for carcinoid   PLANTAR FASCIA SURGERY     rectal fissure repair     THYROID SURGERY     nodule and partial  thyroidectomy   TONSILLECTOMY      Current Medications: Current Meds  Medication Sig   aspirin 81 MG tablet Take 81 mg by mouth daily.   Biotin 5000 MCG CAPS Take 5,000 mcg by mouth daily with breakfast.   Cholecalciferol (VITAMIN D-3) 25 MCG (1000 UT) CAPS Take 1,000 Units by mouth daily with breakfast.    clobetasol (TEMOVATE) 0.05 % external solution Apply 1 application topically as needed.   Esomeprazole Magnesium (NEXIUM 24HR) 20 MG TBEC Take 1 tablet by mouth daily.   levothyroxine (SYNTHROID) 125 MCG tablet TAKE ONE TABLET BY MOUTH EVERY MORNING   Methylcellulose, Laxative, (CITRUCEL) 500 MG TABS Take 1 tablet by mouth daily.    Multiple Vitamins-Minerals (CENTRUM SILVER) tablet Take 1 tablet by mouth daily with breakfast.    naproxen sodium (ALEVE) 220 MG tablet Take 220-440 mg by mouth as needed (for pain or headaches).    rosuvastatin (CRESTOR) 10 MG tablet TAKE ONE (1) TABLET BY MOUTH EACH DAY     Allergies:   Codeine   Social History   Socioeconomic History   Marital status: Married    Spouse name: Not on file   Number of children: 1   Years of education: Not on file   Highest education level: Not on file  Occupational History   Occupation: homemaker  Tobacco Use   Smoking status: Never Smoker   Smokeless tobacco: Never Used  Substance and Sexual Activity   Alcohol use: Yes    Alcohol/week: 0.0 standard drinks    Comment: wine-occ   Drug use: No   Sexual activity: Yes    Birth control/protection: Surgical  Other Topics Concern   Not on file  Social History Narrative   She is married, she is a homemaker, she has 1 son who is a Sport and exercise psychologist, he is an Engineer, mining in [place]   Very occasional alcohol never smoker no tobacco no drug use   Social Determinants of Radio broadcast assistant Strain:    Difficulty of Paying Living Expenses: Not on file  Food Insecurity:    Worried About Charity fundraiser in the Last Year: Not on file   [employer] of Food in the Last Year: Not on file  Transportation Needs:    Lack of Transportation (Medical): Not on file   Lack of Transportation (Non-Medical): Not on file  Physical Activity:    Days of Exercise per Week: Not on file   Minutes of Exercise per Session: Not on file  Stress:    Feeling of Stress : Not on file  Social Connections:    Frequency of Communication with Friends and Family: Not on file   Frequency of Social Gatherings with Friends and Family: Not on file   Attends Religious Services: Not on file   Active Member of Clubs or Organizations: Not on file   Attends Archivist  Meetings: Not on file   Marital Status: Not on file     Family History: The patient's family history includes Breast cancer in her maternal grandmother; Cancer in her maternal grandmother; Heart disease in her maternal grandfather; Kidney cancer in her father. There is no history of Colon cancer, Colon polyps, Esophageal cancer, Rectal cancer, or Stomach cancer.  ROS:   Please see the history of present illness.     All other systems reviewed and are negative.  EKGs/Labs/Other Studies Reviewed:    The following studies were reviewed today: No syncope no bleeding  EKG:  EKG is not ordered today.    Recent Labs: 08/09/2018: ALT 20 08/24/2018: Hemoglobin 14.6; Magnesium 2.2; Platelets 182; TSH 1.450 03/12/2019: BUN 14; Creatinine, Ser 0.79; Potassium 4.8; Sodium 136  Recent Lipid Panel    Component Value Date/Time   CHOL 160 06/16/2018 0803   TRIG 95 06/16/2018 0803   HDL 65 06/16/2018 0803   CHOLHDL 2.5 06/16/2018 0803   CHOLHDL 3.1 07/16/2013 0705   VLDL 18 07/16/2013 0705   LDLCALC 76 06/16/2018 0803    Physical Exam:    VS:  BP 130/78    Pulse 84    Ht 5' 7.75" (1.721 m)    Wt 265 lb (120.2 kg)    SpO2 97%    BMI 40.59 kg/m     Wt Readings from Last 3 Encounters:  03/14/19 265 lb (120.2 kg)  03/12/19 267 lb (121.1 kg)  02/13/19 260 lb 8 oz (118.2 kg)     GEN: Overweight well nourished, well developed in no acute distress HEENT: Normal NECK: No JVD; No carotid bruits LYMPHATICS: No lymphadenopathy CARDIAC: RRR,  murmurs, rubs, gallops RESPIRATORY: Mild wheeze heard on expiration bilaterally ABDOMEN: Soft, non-tender, non-distended MUSCULOSKELETAL:  No edema; No deformity  SKIN: Warm and dry NEUROLOGIC:  Alert and oriented x 3 PSYCHIATRIC:  Normal affect   ASSESSMENT:    1. Dyspnea on exertion   2. Pre-procedure lab exam    PLAN:    In order of problems listed above:  Ongoing dyspnea on exertion worsening -She does have a mild wheeze heard bilaterally on  expiration.  Occasionally her husband will hear the wheeze as well when sitting on the couch.  I wonder if her airways are the issue?  I will go ahead and refer her to pulmonary.  I am sure they will perform spirometry. -I personally reviewed her echocardiogram again and she did have a very small pericardial effusion mostly posterior without any evidence of hemodynamic compromise.  Should not be of any clinical significance.  EF was normal.  At the most, she may have had grade 1 diastolic dysfunction.  Pulmonary pressures were estimated normal. -I will go ahead and check a coronary CTA to evaluate for any evidence of coronary artery disease.  It would be unusual however to have a significant lesion because a resting wheeze. -She does not appear to be volume overloaded with me today.  Blood pressure is 130/78.  Satting well. -D-dimer was normal.  Troponin was normal.  Hemoglobin is normal. -While sitting here with me occasionally she will take in a deep inspiration. -I will repeat her echocardiogram to ensure there has been no dramatic changes since the 7 months prior echo.  Close follow-up in 2 months  Candee Furbish, MD    Medication Adjustments/Labs and Tests Ordered: Current medicines are reviewed at length with the patient today.  Concerns regarding medicines are outlined above.  Orders Placed This Encounter  Procedures   CT CORONARY MORPH W/CTA COR W/SCORE W/CA W/CM &/OR WO/CM   CT CORONARY FRACTIONAL FLOW RESERVE DATA PREP   CT CORONARY FRACTIONAL FLOW RESERVE FLUID ANALYSIS   Ambulatory referral to Pulmonology   ECHOCARDIOGRAM COMPLETE   Meds ordered this encounter  Medications   metoprolol tartrate (LOPRESSOR) 100 MG tablet    Sig: Take 1 tablet (100 mg total) by mouth once for 1 dose. Take (1) tablet 2 hours before your coronary CT scan    Dispense:  1 tablet    Refill:  0    Patient Instructions  Medication Instructions:  The current medical regimen is effective;   continue present plan and medications.  *If you need a refill on your cardiac medications before your next appointment, please call your pharmacy*  Testing/Procedures: Your physician has requested that you have an echocardiogram. Echocardiography is a painless test that uses sound waves to create images of your heart. It provides your doctor with information about the size and shape of your heart and how well your hearts chambers and valves are working. This procedure takes approximately one hour. There are no restrictions for this procedure.  Your physician has requested that you have Coronary CT. Coronary computed tomography (CT) is a painless test that uses an x-ray machine to take clear, detailed pictures of your heart.  Please follow instruction sheet as given.  You have been referred to Pulmonary for further evaluation of shortness of breath.  Follow-Up: At [facility], you and your health needs are our priority.  As part of our continuing mission to provide you with exceptional heart care, we have created designated Provider Care Teams.  These Care Teams include your primary Cardiologist (physician) and Advanced Practice Providers (APPs -  Physician Assistants and Nurse Practitioners) who all work together to provide you with the care you need, when you need it.  We recommend signing up for the patient portal called "MyChart".  Sign up information is provided on this After Visit Summary.  MyChart is used to connect with patients for Virtual Visits (Telemedicine).  Patients are able to view lab/test results, encounter notes, upcoming appointments, etc.  Non-urgent messages can be sent to your provider as well.   To learn more about what you can do with MyChart, go to [email or web address].    Your next appointment:   2 month(s)  The format for your next appointment:   In Person  Provider:   Candee Furbish, MD   Your cardiac CT will be scheduled at the below location:   [facility] [address] [place], [place] [address] [phone]  Please arrive at the [facility] main entrance of [facility] 30 minutes prior to test start time. Proceed to the [facility] Radiology Department (first floor) to check-in and test prep.  Please follow these instructions carefully (unless otherwise directed):  On the Night Before the Test:  Be sure to Drink plenty of water.  Do not consume any caffeinated/decaffeinated beverages or chocolate 12 hours prior to your test.  Do not take any antihistamines 12 hours prior to your test.  On the Day of the Test:  Drink plenty of water. Do not drink any water within one hour of the test.  Do not eat any food 4 hours prior to the test.  You may take your regular medications prior to the test.   Take metoprolol (Lopressor) two hours prior to test.  HOLD Furosemide/Hydrochlorothiazide morning of the test.  FEMALES- please wear underwire-free bra if available      After the Test:  Drink plenty of water.  After receiving IV contrast, you may experience a mild flushed feeling. This is normal.  On occasion, you may experience a mild rash up to 24 hours after the test. This is not dangerous. If this occurs, you can take Benadryl 25 mg and increase your fluid intake.  If you experience trouble breathing, this can be serious. If it is severe call 911 IMMEDIATELY. If it is mild, please call our office.   Once we have confirmed authorization from your  insurance company, we will call you to set up a date and time for your test.   For non-scheduling related questions, please contact the cardiac imaging nurse navigator should you have any questions/concerns: Marchia Bond, RN Navigator Cardiac Imaging [facility] [phone] mobile       Signed, Candee Furbish, MD  03/14/2019 3:18 PM    [place]

## 2019-03-14 NOTE — Patient Instructions (Addendum)
Medication Instructions:  The current medical regimen is effective;  continue present plan and medications.  *If you need a refill on your cardiac medications before your next appointment, please call your pharmacy*  Testing/Procedures: Your physician has requested that you have an echocardiogram. Echocardiography is a painless test that uses sound waves to create images of your heart. It provides your doctor with information about the size and shape of your heart and how well your heart's chambers and valves are working. This procedure takes approximately one hour. There are no restrictions for this procedure.  Your physician has requested that you have Coronary CT. Coronary computed tomography (CT) is a painless test that uses an x-ray machine to take clear, detailed pictures of your heart.  Please follow instruction sheet as given.  You have been referred to Pulmonary for further evaluation of shortness of breath.  Follow-Up: At [facility], you and your health needs are our priority.  As part of our continuing mission to provide you with exceptional heart care, we have created designated Provider Care Teams.  These Care Teams include your primary Cardiologist (physician) and Advanced Practice Providers (APPs -  Physician Assistants and Nurse Practitioners) who all work together to provide you with the care you need, when you need it.  We recommend signing up for the patient portal called "MyChart".  Sign up information is provided on this After Visit Summary.  MyChart is used to connect with patients for Virtual Visits (Telemedicine).  Patients are able to view lab/test results, encounter notes, upcoming appointments, etc.  Non-urgent messages can be sent to your provider as well.   To learn more about what you can do with MyChart, go to [email or web address].    Your next appointment:   2 month(s)  The format for your next appointment:   In Person  Provider:   Candee Furbish,  MD   Your cardiac CT will be scheduled at the below location:   [facility] [address] [place], [place] [address] [phone]  Please arrive at the [facility] main entrance of [facility] 30 minutes prior to test start time. Proceed to the [facility] Radiology Department (first floor) to check-in and test prep.  Please follow these instructions carefully (unless otherwise directed):  On the Night Before the Test: . Be sure to Drink plenty of water. . Do not consume any caffeinated/decaffeinated beverages or chocolate 12 hours prior to your test. . Do not take any antihistamines 12 hours prior to your test.  On the Day of the Test: . Drink plenty of water. Do not drink any water within one hour of the test. . Do not eat any food 4 hours prior to the test. . You may take your regular medications prior to the test.  . Take metoprolol (Lopressor) two hours prior to test. . HOLD Furosemide/Hydrochlorothiazide morning of the test. . FEMALES- please wear underwire-free bra if available      After the Test: . Drink plenty of water. . After receiving IV contrast, you may experience a mild flushed feeling. This is normal. . On occasion, you may experience a mild rash up to 24 hours after the test. This is not dangerous. If this occurs, you can take Benadryl 25 mg and increase your fluid intake. . If you experience trouble breathing, this can be serious. If it is severe call 911 IMMEDIATELY. If it is mild, please call our office.   Once we have confirmed authorization from your insurance  company, we will call you to set up a date and time for your test.   For non-scheduling related questions, please contact the cardiac imaging nurse navigator should you have any questions/concerns: Marchia Bond, RN Navigator Cardiac Imaging [facility] [phone] mobile

## 2019-03-15 ENCOUNTER — Telehealth: Payer: Self-pay | Admitting: Family Medicine

## 2019-03-15 NOTE — Telephone Encounter (Signed)
Patient seen cardiology on 3/3 and he made a referral to the pulmonology on 3/12 she has some questions and concerns about seeing this doctor. She states that you were going to send her somewhere and wanting your opinion on this doctor she is going see.Please advise

## 2019-03-17 ENCOUNTER — Ambulatory Visit: Payer: BC Managed Care – PPO

## 2019-03-19 NOTE — Telephone Encounter (Signed)
Left message to return call 

## 2019-03-19 NOTE — Telephone Encounter (Signed)
Nurses I did read over her consultation with the cardiologist I am glad that they are proceeding forward with the coronary CT I also saw where the cardiologist set her up with Dr. Carlis Abbott pulmonary I agree with this consultation I believe that part of her work-up is also having pulmonary do a pulmonary function test to see if she may be developing some underlying asthma or COPD Please see if the patient has any additional questions or concerns (Otherwise I do recommend a follow-up with the pulmonary)

## 2019-03-19 NOTE — Telephone Encounter (Signed)
Pt returned call. Pt is wanting to make sure Dr.Scott is comfortable with her seeing Dr.Clark. Pt states she is not trying to be snooty about this but Dr.Clark is a D.O and not a M.D. Pt states that when she checked out at cardiologist, the check out personnel called and set up the appt. Please advise. Thank you

## 2019-03-20 NOTE — Telephone Encounter (Signed)
Contacted patient. Pt verbalized understanding. Pt has appt set up for this Friday 3/12 with Dr.Clark. advised patient that provider did review over [facility] speciality training and residency and feels that it is fine to see Dr.Clark. Gave patient the name of pulmonologist that Dr.Scott provided. Pt states that she may go ahead with the appt she already had on the 12th. Informed pt to let us know if we can help her with anything regarding getting set up with one of the doctors listed below.   [place]

## 2019-03-20 NOTE — Telephone Encounter (Signed)
Tanya-I will provide you a list of doctors that I have had patients see before with pulmonary.  More importantly is the residency and specialty training in this Dr. Has good training and I do feel its fine to see Dr. Carlis Abbott but review with the patient if she is more interested in seeing one of the other doctors.  Ultimately it is the patient's choice

## 2019-03-21 ENCOUNTER — Encounter: Payer: Self-pay | Admitting: Family Medicine

## 2019-03-23 ENCOUNTER — Encounter: Payer: Self-pay | Admitting: Critical Care Medicine

## 2019-03-23 ENCOUNTER — Other Ambulatory Visit: Payer: Self-pay

## 2019-03-23 ENCOUNTER — Ambulatory Visit: Payer: BC Managed Care – PPO | Admitting: Critical Care Medicine

## 2019-03-23 VITALS — BP 130/78 | HR 72 | Temp 97.3°F | Ht 68.0 in | Wt 265.0 lb

## 2019-03-23 DIAGNOSIS — R0602 Shortness of breath: Secondary | ICD-10-CM

## 2019-03-23 DIAGNOSIS — R0600 Dyspnea, unspecified: Secondary | ICD-10-CM

## 2019-03-23 DIAGNOSIS — Z85110 Personal history of malignant carcinoid tumor of bronchus and lung: Secondary | ICD-10-CM | POA: Diagnosis not present

## 2019-03-23 DIAGNOSIS — R0609 Other forms of dyspnea: Secondary | ICD-10-CM

## 2019-03-23 MED ORDER — BREO ELLIPTA 200-25 MCG/INH IN AEPB: 1.0000 | INHALATION_SPRAY | Freq: Every day | RESPIRATORY_TRACT | 0 refills | Status: DC

## 2019-03-23 MED ORDER — ALBUTEROL SULFATE HFA 108 (90 BASE) MCG/ACT IN AERS: 2.0000 | INHALATION_SPRAY | Freq: Four times a day (QID) | RESPIRATORY_TRACT | 2 refills | Status: DC | PRN

## 2019-03-23 MED ORDER — BREO ELLIPTA 200-25 MCG/INH IN AEPB: 1.0000 | INHALATION_SPRAY | Freq: Every day | RESPIRATORY_TRACT | 5 refills | Status: DC

## 2019-03-23 NOTE — Progress Notes (Signed)
Synopsis: Referred in March 2021 for dyspnea on exertion by Jerline Pain, MD.   Subjective:   PATIENT ID: Debbie Miranda GENDER: female DOB: [birth year], MRN: [id number]  Chief Complaint  Patient presents with  . Consult    Patient is here to establish care and has shortness of breath. Patient states that it was with exertion but now it is all the time. Says that over the last month it has got worse. Patient also has a dry cough.    Debbie Miranda is a 64 year old woman who presents for evaluation of shortness of breath.  She suddenly developed shortness of breath overnight about 10 months ago that has worsened over time, especially over the past 3 months.  It was initially dyspnea with exertion, but has now progressed to include shortness of breath at rest.  When this started she was able to walk about 45 minutes on the treadmill without stopping.  She can only walk about 15 minutes without stopping now.  If she is outside and there is any incline at all she has to stop.  She can no longer walk up the stairs without stopping.  She has wheezing at times and occasional dry cough.  No nighttime symptoms.  No significant bruising, bleeding or history of anemia.  She takes baby aspirin daily, but no blood thinners.  She recently had blood work with her PCP that she reports was normal.  No chest pain or personal history of DVTs.  Her mother had a pulmonary embolus.  She occasionally has feelings of tachycardia and leg edema.  No significant improvement or worsening with position changes.  Nothing has improved her symptoms, and activity is the only exacerbating factor. She has no history of allergies or asthma.  She has no significant inhalational exposures.  She has never tried inhalers.  Was evaluated by cardiology in the fall and was felt to have dyspnea due to her obesity.  She has been this weight before without having significant symptoms.  She has been working on losing weight, which has been  difficult.  Repeat echocardiogram and coronary CT scans have been ordered.   She has a history of a left lower lobe carcinoid that was incidentally detected and underwent left lower lobectomy in December 2013 at [facility].  No previous radiation or chemotherapy required.  She followed with Dr. Patsy [place] oncology-most recent note from 12/13/2016 reviewed.  Most recent CT scan in 2018 did not show recurrence.    Past Medical History:  Diagnosis Date  . Acute ischemic colitis ([place]) 08/09/2018  . Anal itching 10/27/2011  . Arthritis   . Cancer ([place])    lung-7years ago  . 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 Cataract    beginning stages,bilateral  . Contact lens/glasses fitting    wares contacts or glasses  . Corn of toe 5th right 08/16/2012  . Costochondritis   . Diverticulitis 2012  . Dysplastic nevi   . Esophageal reflux   . External hemorrhoids without mention of complication [id number]  . GERD with stricture 05/16/2012  . Headache(784.0)   . HTN (hypertension) 08/09/2018  . Hyperlipidemia   . Hypertension    pt denies  . Hypothyroidism   . IFG (impaired fasting glucose)   . Impaired fasting glucose 05/16/2012  . Mycotic toenails 12/10/2010   1st toenail right-lasered nail 12/10/2010-present (last on 06/15/2012)  . Neuropathic pain 06/30/2016  . Obesity   . Personal history of colonic polyps 07/30/2015  SSA  . PONV (postoperative nausea and vomiting)   . Positive H. pylori test   . Thyroid nodule 05/16/2012   Benign, 2013, [place]   . Urge incontinence 07/05/2018  . Venous insufficiency of left leg 03/29/2016     Family History  Problem Relation Age of Onset  . Kidney cancer Father   . Pulmonary embolism Mother   . Breast cancer Maternal Grandmother   . Cancer Maternal Grandmother        breast  . Heart disease Maternal Grandfather   . Colon cancer Neg Hx   . Colon polyps Neg Hx   . Esophageal cancer Neg Hx   . Rectal cancer Neg Hx   . Stomach cancer Neg Hx       Past Surgical History:  Procedure Laterality Date  . ABDOMINAL HYSTERECTOMY    . APPENDECTOMY    . COLONOSCOPY    . ESOPHAGOGASTRODUODENOSCOPY    . EXCISION HAGLUND'S DEFORMITY WITH ACHILLES TENDON REPAIR Right 06/07/2013   Procedure: RIGHT ACHILLES DEBRIDEMENT; [id number] EXCISION;  Surgeon: Wylene Simmer, MD;  Location: [place];  Service: Orthopedics;  Laterality: Right;  . EXCISION OF SKIN TAG  11/18/2011   Procedure: EXCISION OF SKIN TAG;  Surgeon: Leighton Ruff, MD;  Location: [place];  Service: General;  Laterality: N/A;  posterior  . GASTROCNEMIUS RECESSION Right 06/07/2013   Procedure: GASTROCNEMIUS RECESSION;  Surgeon: Wylene Simmer, MD;  Location: [place];  Service: Orthopedics;  Laterality: Right;  . HEMORRHOID BANDING    . HEMORRHOID SURGERY  11/18/2011   Procedure: HEMORRHOIDECTOMY;  Surgeon: Leighton Ruff, MD;  Location: [facility];  Service: General;;  . LEFT OOPHORECTOMY    . LUNG LOBECTOMY  12/2011   [place] removed for carcinoid  . PLANTAR FASCIA SURGERY    . rectal fissure repair    . THYROID SURGERY     nodule and partial thyroidectomy  . TONSILLECTOMY      Social History   Socioeconomic History  . Marital status: Married    Spouse name: Not on file  . Number of children: 1  . Years of education: Not on file  . Highest education level: Not on file  Occupational History  . Occupation: homemaker  Tobacco Use  . Smoking status: Never Smoker  . Smokeless tobacco: Never Used  Substance and Sexual Activity  . Alcohol use: Yes    Alcohol/week: 0.0 standard drinks    Comment: wine-occ  . Drug use: No  . Sexual activity: Yes    Birth control/protection: Surgical  Other Topics Concern  . Not on file  Social History Narrative   She is married, she is a homemaker, she has 1 son who is a Sport and exercise psychologist, he is an Engineer, mining in [place]   Very occasional  alcohol never smoker no tobacco no drug use   Social Determinants of Radio broadcast assistant Strain:   . Difficulty of Paying Living Expenses:   Food Insecurity:   . Worried About Charity fundraiser in the Last Year:   . Arboriculturist in the Last Year:   Transportation Needs:   . Film/video editor (Medical):   Marland Kitchen Lack of Transportation (Non-Medical):   Physical Activity:   . Days of Exercise per Week:   . Minutes of Exercise per Session:   Stress:   . Feeling of Stress :   Social Connections:   . Frequency of Communication  with Friends and Family:   . Frequency of Social Gatherings with Friends and Family:   . Attends Religious Services:   . Active Member of Clubs or Organizations:   . Attends Archivist Meetings:   Marland Kitchen Marital Status:   Intimate Partner Violence:   . Fear of Current or Ex-Partner:   . Emotionally Abused:   Marland Kitchen Physically Abused:   . Sexually Abused:      Allergies  Allergen Reactions  . Codeine Nausea And Vomiting     Immunization History  Administered Date(s) Administered  . H1N1 01/29/2008  . Influenza Inj Mdck Quad Pf 10/13/2016, 10/27/2017, 10/18/2018  . Influenza,inj,Quad PF,6+ Mos 11/05/2014  . Influenza,inj,quad, With Preservative 10/27/2017  . Influenza-Unspecified 10/11/2008, 10/15/2013, 11/05/2014, 09/30/2015, 10/11/2017, 10/26/2018  . PFIZER SARS-COV-2 Vaccination 02/19/2019, 03/13/2019  . Pneumococcal Conjugate-13 12/12/2014  . Pneumococcal Polysaccharide-23 01/12/2012  . Td 12/12/2014  . Zoster 12/08/2015  . Zoster Recombinat (Shingrix) 07/10/2017, 08/09/2017    Outpatient Medications Prior to Visit  Medication Sig Dispense Refill  . aspirin 81 MG tablet Take 81 mg by mouth daily.    . Biotin 5000 MCG CAPS Take 5,000 mcg by mouth daily with breakfast.    . Cholecalciferol (VITAMIN D-3) 25 MCG (1000 UT) CAPS Take 1,000 Units by mouth daily with breakfast.     . clobetasol (TEMOVATE) 0.05 % external solution Apply 1  application topically as needed.    . Esomeprazole Magnesium (NEXIUM 24HR) 20 MG TBEC Take 1 tablet by mouth daily.    Marland Kitchen levothyroxine (SYNTHROID) 125 MCG tablet TAKE ONE TABLET BY MOUTH EVERY MORNING 90 tablet 0  . Methylcellulose, Laxative, (CITRUCEL) 500 MG TABS Take 1 tablet by mouth daily.    . metoprolol tartrate (LOPRESSOR) 100 MG tablet Take 1 tablet (100 mg total) by mouth once for 1 dose. Take (1) tablet 2 hours before your coronary CT scan 1 tablet 0  . Multiple Vitamins-Minerals (CENTRUM SILVER) tablet Take 1 tablet by mouth daily with breakfast.     . naproxen sodium (ALEVE) 220 MG tablet Take 220-440 mg by mouth as needed (for pain or headaches).     . rosuvastatin (CRESTOR) 10 MG tablet TAKE ONE (1) TABLET BY MOUTH EACH DAY 90 tablet 0   No facility-administered medications prior to visit.    Review of Systems  Constitutional: Negative.   HENT: Negative for congestion.   Respiratory: Positive for cough, shortness of breath and wheezing. Negative for sputum production.   Cardiovascular: Negative for chest pain and leg swelling.  Gastrointestinal: Negative for blood in stool and melena.  Genitourinary: Negative for hematuria.  Neurological: Negative for weakness.  Endo/Heme/Allergies: Negative for environmental allergies. Does not bruise/bleed easily.     Objective:   Vitals:   03/23/19 0859  BP: 130/78  Pulse: 72  Temp: (!) 97.3 F (36.3 C)  TempSrc: Temporal  SpO2: 96%  Weight: 265 lb (120.2 kg)  Height: 5\' 8"  (1.727 m)   96% on   RA BMI Readings from Last 3 Encounters:  03/23/19 40.29 kg/m  03/14/19 40.59 kg/m  03/12/19 40.90 kg/m   Wt Readings from Last 3 Encounters:  03/23/19 265 lb (120.2 kg)  03/14/19 265 lb (120.2 kg)  03/12/19 267 lb (121.1 kg)    Physical Exam Vitals reviewed.  HENT:     Head: Normocephalic and atraumatic.  Eyes:     General: No scleral icterus. Cardiovascular:     Rate and Rhythm: Normal rate and regular rhythm.  Heart sounds: No murmur.  Pulmonary:     Comments: Tachypneic and visibly dyspneic during exam.  Mild conversational dyspnea.  Clear to auscultation bilaterally.  No witnessed coughing. Abdominal:     General: There is no distension.  Musculoskeletal:        General: No swelling or deformity.     Cervical back: Neck supple.  Lymphadenopathy:     Cervical: No cervical adenopathy.  Skin:    General: Skin is warm and dry.     Findings: No bruising or rash.  Neurological:     General: No focal deficit present.     Mental Status: She is alert.     Motor: No weakness.     Coordination: Coordination normal.  Psychiatric:        Mood and Affect: Mood normal.        Behavior: Behavior normal.      CBC    Component Value Date/Time   WBC 7.3 08/24/2018 0919   WBC 11.9 (H) 08/10/2018 0829   RBC 4.57 08/24/2018 0919   RBC 4.41 08/10/2018 0829   HGB 14.6 08/24/2018 0919   HCT 44.1 08/24/2018 0919   PLT 182 08/24/2018 0919   MCV 97 08/24/2018 0919   MCH 31.9 08/24/2018 0919   MCH 32.0 08/10/2018 0829   MCHC 33.1 08/24/2018 0919   MCHC 32.9 08/10/2018 0829   RDW 12.6 08/24/2018 0919   LYMPHSABS 1.9 08/24/2018 0919   MONOABS 0.4 07/16/2013 0705   EOSABS 0.1 08/24/2018 0919   BASOSABS 0.0 08/24/2018 0919    CHEMISTRY No results for input(s): NA, K, CL, CO2, GLUCOSE, BUN, CREATININE, CALCIUM, MG, PHOS in the last 168 hours. Estimated Creatinine Clearance: 98.2 mL/min (by C-G formula based on SCr of 0.79 mg/dL).   Chest Imaging- films reviewed: CXR, 2 view 03/12/2019- rounded density on lateral view anterior to carina, possibly prominent pulmonary artery, similar in appearance to CXR in 2012.  Excessive lucency in the posterior lower hemithorax.  CT chest 2013-left lower lobe nodule 1.4 x 1.0 cm, subpleural nodule in the right.  Subcentimeter left upper lobe nodule.  Well-circumscribed density in the mediastinum just anterior to the carina.  Reports [facility] CT [facility], most recently 2018 reviewed-postoperative changes from left lower lobectomy without evidence of recurrent disease.  Scattered pulmonary nodules-LUL 3 mm stable, LUL 8 mm stable, RLL 3 mm stable.  Other scattered nodules not further described.  Pulmonary Functions Testing Results: No flowsheet data found.  PFT 12/20/2011 at [facility]: FVC 3.12 (84%) FEV1 2.09 (72%) Ratio 67 TLC 5.08 (90%) RV 1.96 (93%) CO 29.6 (145%) Moderate obstruction, no restriction or diffusion impairment.   Echocardiogram 08/31/2018: LVEF 55 to 60%, mild asymmetric hypertrophy with diastolic dysfunction.  Normal LA, RV, RA.  Trivial MR.  Trivial TR, valve leaflets moderately thickened.  Otherwise normal valves.  Trivial pericardial effusion.      Assessment & Plan:     ICD-10-CM   1. DOE (dyspnea on exertion)  R06.00 Pulmonary Function Test  2. Shortness of breath  R06.02 CT Chest Wo Contrast  3. History of malignant carcinoid tumor of bronchus and lung  Z85.110     Dyspnea on exertion and associated wheezing.  Previous PFTs demonstrated obstruction, but no history of asthma treatment. -Empiric trial of Breo once daily.  Training inhaler provided with instruction.  Instructed to rinse her mouth after use -Albuterol as needed -PFTs- first available in ~ 2 months -CT scan to evaluate for recurrence of  carcinoid -Agree with planned cardiac testing.   History of pulmonary carcinoid, no previous carcinoid syndrome -Follow-up CT chest to evaluate for recurrence  RTC in 2 months after PFTs.    Current Outpatient Medications:  .  aspirin 81 MG tablet, Take 81 mg by mouth daily., Disp: , Rfl:  .  Biotin 5000 MCG CAPS, Take 5,000 mcg by mouth daily with breakfast., Disp: , Rfl:  .  Cholecalciferol (VITAMIN D-3) 25 MCG (1000 UT) CAPS, Take 1,000 Units by mouth daily with breakfast. , Disp: , Rfl:  .  clobetasol (TEMOVATE) 0.05 % external solution, Apply 1 application topically as needed., Disp:  , Rfl:  .  Esomeprazole Magnesium (NEXIUM 24HR) 20 MG TBEC, Take 1 tablet by mouth daily., Disp: , Rfl:  .  levothyroxine (SYNTHROID) 125 MCG tablet, TAKE ONE TABLET BY MOUTH EVERY MORNING, Disp: 90 tablet, Rfl: 0 .  Methylcellulose, Laxative, (CITRUCEL) 500 MG TABS, Take 1 tablet by mouth daily., Disp: , Rfl:  .  metoprolol tartrate (LOPRESSOR) 100 MG tablet, Take 1 tablet (100 mg total) by mouth once for 1 dose. Take (1) tablet 2 hours before your coronary CT scan, Disp: 1 tablet, Rfl: 0 .  Multiple Vitamins-Minerals (CENTRUM SILVER) tablet, Take 1 tablet by mouth daily with breakfast. , Disp: , Rfl:  .  naproxen sodium (ALEVE) 220 MG tablet, Take 220-440 mg by mouth as needed (for pain or headaches). , Disp: , Rfl:  .  rosuvastatin (CRESTOR) 10 MG tablet, TAKE ONE (1) TABLET BY MOUTH EACH DAY, Disp: 90 tablet, Rfl: 0 .  albuterol (VENTOLIN HFA) 108 (90 Base) MCG/ACT inhaler, Inhale 2 puffs into the lungs every 6 (six) hours as needed for wheezing or shortness of breath., Disp: 18 g, Rfl: 2 .  fluticasone furoate-vilanterol (BREO ELLIPTA) 200-25 MCG/INH AEPB, Inhale 1 puff into the lungs daily., Disp: 1 each, Rfl: 5 .  fluticasone furoate-vilanterol (BREO ELLIPTA) 200-25 MCG/INH AEPB, Inhale 1 puff into the lungs daily., Disp: 28 each, Rfl: 0    Julian Hy, DO [facility] 03/23/2019 10:11 AM

## 2019-03-23 NOTE — Patient Instructions (Addendum)
Thank you for visiting Dr. Carlis Abbott at [facility]. We recommend the following: Orders Placed This Encounter  Procedures  . CT Chest Wo Contrast  . Pulmonary Function Test   Orders Placed This Encounter  Procedures  . CT Chest Wo Contrast    Standing Status:   Future    Standing Expiration Date:   05/22/2020    Order Specific Question:   ** REASON FOR EXAM (FREE TEXT)    Answer:   history of carcinoid, s/p LLL resection    Order Specific Question:   Preferred imaging location?    Answer:   [facility]    Order Specific Question:   Radiology Contrast Protocol - do NOT remove file path    Answer:   [email or web address]  . Pulmonary Function Test    Standing Status:   Future    Standing Expiration Date:   03/22/2020    Order Specific Question:   Where should this test be performed?    Answer:   [facility]    Order Specific Question:   Full PFT: includes the following: basic spirometry, spirometry pre & post bronchodilator, diffusion capacity (DLCO), lung volumes    Answer:   Full PFT    Meds ordered this encounter  Medications  . albuterol (VENTOLIN HFA) 108 (90 Base) MCG/ACT inhaler    Sig: Inhale 2 puffs into the lungs every 6 (six) hours as needed for wheezing or shortness of breath.    Dispense:  18 g    Refill:  2  . fluticasone furoate-vilanterol (BREO ELLIPTA) 200-25 MCG/INH AEPB    Sig: Inhale 1 puff into the lungs daily.    Dispense:  1 each    Refill:  5    Return in about 2 months (around 05/23/2019). after PFTs.    Please do your part to reduce the spread of COVID-19.

## 2019-03-26 ENCOUNTER — Telehealth: Payer: Self-pay | Admitting: Cardiology

## 2019-03-26 NOTE — Telephone Encounter (Signed)
Pt called to report that she is also having a chest CT per Dr. Carlis Abbott on 03/28/19 due to SOB and H/O lung CA... she did not want it to postpone her from having her Coronary CT. Advised pt she will receive a call back once able to be scheduled and that it sometimes takes time for the procedure to go through the precert process. Pt verbalized understanding and agreed.   Will forward her chart to the [facility].

## 2019-03-26 NOTE — Telephone Encounter (Signed)
New message:    Patient calling stating that some one was going to call concerning a CT Coronary. She hasn't heard from anyone. Please call patient.

## 2019-03-26 NOTE — Telephone Encounter (Signed)
Pt advised per the [facility] still waiting for her authorization and will call her asap once that is completed and they are scheduling in to the first week of April. Pt verbalized understanding.

## 2019-03-28 ENCOUNTER — Other Ambulatory Visit: Payer: Self-pay

## 2019-03-28 ENCOUNTER — Ambulatory Visit (HOSPITAL_BASED_OUTPATIENT_CLINIC_OR_DEPARTMENT_OTHER)
Admission: RE | Admit: 2019-03-28 | Discharge: 2019-03-28 | Disposition: A | Discharge status: Home / Self Care | Payer: BC Managed Care – PPO | Source: Ambulatory Visit | Attending: Critical Care Medicine | Admitting: Critical Care Medicine

## 2019-03-28 DIAGNOSIS — R0602 Shortness of breath: Secondary | ICD-10-CM

## 2019-04-02 ENCOUNTER — Telehealth: Payer: Self-pay | Admitting: Critical Care Medicine

## 2019-04-02 ENCOUNTER — Other Ambulatory Visit (HOSPITAL_COMMUNITY)
Admission: RE | Admit: 2019-04-02 | Discharge: 2019-04-02 | Disposition: A | Discharge status: Home / Self Care | Payer: BC Managed Care – PPO | Source: Ambulatory Visit | Attending: Critical Care Medicine | Admitting: Critical Care Medicine

## 2019-04-02 DIAGNOSIS — Z85110 Personal history of malignant carcinoid tumor of bronchus and lung: Secondary | ICD-10-CM

## 2019-04-02 DIAGNOSIS — Z01812 Encounter for preprocedural laboratory examination: Secondary | ICD-10-CM | POA: Insufficient documentation

## 2019-04-02 DIAGNOSIS — Z20822 Contact with and (suspected) exposure to covid-19: Secondary | ICD-10-CM | POA: Insufficient documentation

## 2019-04-02 LAB — SARS CORONAVIRUS 2 (TAT 6-24 HRS): SARS Coronavirus 2: NEGATIVE

## 2019-04-02 NOTE — Telephone Encounter (Signed)
I spoke to Debbie Miranda about her CT results. There is a RUL subpleural nodule that should be followed given her history. Will get repeat CT in 3 months, and if stable will space out additional follow up.  PFTs scheduled for next week.  Julian Hy, DO 04/02/19 4:25 PM [facility]

## 2019-04-03 ENCOUNTER — Other Ambulatory Visit (HOSPITAL_COMMUNITY): Payer: BC Managed Care – PPO

## 2019-04-05 ENCOUNTER — Other Ambulatory Visit: Payer: Self-pay

## 2019-04-05 ENCOUNTER — Ambulatory Visit (INDEPENDENT_AMBULATORY_CARE_PROVIDER_SITE_OTHER): Payer: BC Managed Care – PPO | Admitting: Critical Care Medicine

## 2019-04-05 DIAGNOSIS — R0609 Other forms of dyspnea: Secondary | ICD-10-CM

## 2019-04-05 DIAGNOSIS — R0600 Dyspnea, unspecified: Secondary | ICD-10-CM | POA: Diagnosis not present

## 2019-04-05 LAB — PULMONARY FUNCTION TEST
DL/VA % pred: 146 %
DL/VA: 5.95 ml/min/mmHg/L
DLCO cor % pred: 110 %
DLCO cor: 25.19 ml/min/mmHg
DLCO unc % pred: 110 %
DLCO unc: 25.19 ml/min/mmHg
FEF 25-75 Post: 1.73 L/sec
FEF 25-75 Pre: 1.22 L/sec
FEF2575-%Change-Post: 41 %
FEF2575-%Pred-Post: 70 %
FEF2575-%Pred-Pre: 49 %
FEV1-%Change-Post: 11 %
FEV1-%Pred-Post: 67 %
FEV1-%Pred-Pre: 60 %
FEV1-Post: 1.92 L
FEV1-Pre: 1.73 L
FEV1FVC-%Change-Post: 3 %
FEV1FVC-%Pred-Pre: 94 %
FEV6-%Change-Post: 7 %
FEV6-%Pred-Post: 70 %
FEV6-%Pred-Pre: 66 %
FEV6-Post: 2.55 L
FEV6-Pre: 2.38 L
FEV6FVC-%Pred-Post: 103 %
FEV6FVC-%Pred-Pre: 103 %
FVC-%Change-Post: 7 %
FVC-%Pred-Post: 68 %
FVC-%Pred-Pre: 63 %
FVC-Post: 2.55 L
FVC-Pre: 2.38 L
Post FEV1/FVC ratio: 76 %
Post FEV6/FVC ratio: 100 %
Pre FEV1/FVC ratio: 73 %
Pre FEV6/FVC Ratio: 100 %
RV % pred: 76 %
RV: 1.72 L
TLC % pred: 74 %
TLC: 4.23 L

## 2019-04-05 NOTE — Progress Notes (Signed)
Her PFTs could be consistent with asthma, so she should stay on the Breo prescribed to see if it helps. I will see her for follow up as planned to see if her symptoms have improved. Thanks!

## 2019-04-05 NOTE — Progress Notes (Signed)
Full PFT performed today. °

## 2019-04-07 ENCOUNTER — Other Ambulatory Visit: Payer: Self-pay | Admitting: Family Medicine

## 2019-04-09 ENCOUNTER — Ambulatory Visit (HOSPITAL_COMMUNITY): Payer: BC Managed Care – PPO | Attending: Cardiology

## 2019-04-09 ENCOUNTER — Other Ambulatory Visit: Payer: Self-pay

## 2019-04-09 DIAGNOSIS — R0600 Dyspnea, unspecified: Secondary | ICD-10-CM | POA: Diagnosis not present

## 2019-04-09 DIAGNOSIS — R0609 Other forms of dyspnea: Secondary | ICD-10-CM

## 2019-04-17 ENCOUNTER — Encounter: Payer: Self-pay | Admitting: Critical Care Medicine

## 2019-04-17 ENCOUNTER — Ambulatory Visit (INDEPENDENT_AMBULATORY_CARE_PROVIDER_SITE_OTHER): Payer: BC Managed Care – PPO | Admitting: Critical Care Medicine

## 2019-04-17 ENCOUNTER — Other Ambulatory Visit: Payer: Self-pay

## 2019-04-17 VITALS — BP 120/78 | HR 66 | Temp 97.3°F | Ht 68.0 in | Wt 260.4 lb

## 2019-04-17 DIAGNOSIS — R911 Solitary pulmonary nodule: Secondary | ICD-10-CM | POA: Diagnosis not present

## 2019-04-17 DIAGNOSIS — R0609 Other forms of dyspnea: Secondary | ICD-10-CM

## 2019-04-17 DIAGNOSIS — R0600 Dyspnea, unspecified: Secondary | ICD-10-CM | POA: Diagnosis not present

## 2019-04-17 MED ORDER — MONTELUKAST SODIUM 10 MG PO TABS: 10.0000 mg | ORAL_TABLET | Freq: Every day | ORAL | 11 refills | Status: DC

## 2019-04-17 NOTE — Patient Instructions (Addendum)
Thank you for visiting Dr. Carlis Abbott at [facility]. We recommend the following:  CT scan in June 2021  Keep taking Breo once daily.  Meds ordered this encounter  Medications  . montelukast (SINGULAIR) 10 MG tablet    Sig: Take 1 tablet (10 mg total) by mouth daily before breakfast.    Dispense:  30 tablet    Refill:  11    Return in about 3 months (around 07/17/2019).    Please do your part to reduce the spread of COVID-19.

## 2019-04-17 NOTE — Progress Notes (Signed)
Synopsis: Referred in March 2021 for dyspnea on exertion by Kathyrn Drown, MD.   Subjective:   PATIENT ID: Debbie Miranda GENDER: female DOB: 1955-06-03, MRN: [id number]  Chief Complaint  Patient presents with  . Follow-up    Patient states that she has had some improvment since last visit. Patient is still a little unsure about how to use rescue inhaler. Patient states that she sometimes feels like she cant get a good breath in.     Debbie Miranda is a 64 year old woman who presents for follow-up of shortness of breath.  Since she started on Breo her symptoms have been significantly improved.  But she is noticing she is struggling a little bit more today.  She has been outside recently, and has not noticed a difference in her symptoms if she is outside or inside.  She does not think that the pollen in the air contributes to her symptoms.  She has not been needing her albuterol since she was last seen due to how much better she is been feeling.  She is been wheezing a few times since she was last seen.  Her shortness of breath is significantly improved.  She has had some minor coughing, but no sputum production.  No nocturnal symptoms.  No previous known history of asthma.    OV 03/23/19: Debbie Miranda is a 64 year old woman who presents for evaluation of shortness of breath.  She suddenly developed shortness of breath overnight about 10 months ago that has worsened over time, especially over the past 3 months.  It was initially dyspnea with exertion, but has now progressed to include shortness of breath at rest.  When this started she was able to walk about 45 minutes on the treadmill without stopping.  She can only walk about 15 minutes without stopping now.  If she is outside and there is any incline at all she has to stop.  She can no longer walk up the stairs without stopping.  She has wheezing at times and occasional dry cough.  No nighttime symptoms.  No significant bruising, bleeding or  history of anemia.  She takes baby aspirin daily, but no blood thinners.  She recently had blood work with her PCP that she reports was normal.  No chest pain or personal history of DVTs.  Her mother had a pulmonary embolus.  She occasionally has feelings of tachycardia and leg edema.  No significant improvement or worsening with position changes.  Nothing has improved her symptoms, and activity is the only exacerbating factor. She has no history of allergies or asthma.  She has no significant inhalational exposures.  She has never tried inhalers.  Was evaluated by cardiology in the fall and was felt to have dyspnea due to her obesity.  She has been this weight before without having significant symptoms.  She has been working on losing weight, which has been difficult.  Repeat echocardiogram and coronary CT scans have been ordered.   She has a history of a left lower lobe carcinoid that was incidentally detected and underwent left lower lobectomy in December 2013 at [facility].  No previous radiation or chemotherapy required.  She followed with Dr. Patsy [place] oncology-most recent note from 12/13/2016 reviewed.  Most recent CT scan in 2018 did not show recurrence.    Past Medical History:  Diagnosis Date  . Acute ischemic colitis ([place]) 08/09/2018  . Anal itching 10/27/2011  . Arthritis   . Cancer ([place])    lung-7years ago  .  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 Cataract    beginning stages,bilateral  . Contact lens/glasses fitting    wares contacts or glasses  . Corn of toe 5th right 08/16/2012  . Costochondritis   . Diverticulitis 2012  . Dysplastic nevi   . Esophageal reflux   . External hemorrhoids without mention of complication [id number]  . GERD with stricture 05/16/2012  . Headache(784.0)   . HTN (hypertension) 08/09/2018  . Hyperlipidemia   . Hypertension    pt denies  . Hypothyroidism   . IFG (impaired fasting glucose)   . Impaired fasting glucose 05/16/2012  . Mycotic toenails  12/10/2010   1st toenail right-lasered nail 12/10/2010-present (last on 06/15/2012)  . Neuropathic pain 06/30/2016  . Obesity   . Personal history of colonic polyps 07/30/2015   SSA  . PONV (postoperative nausea and vomiting)   . Positive H. pylori test   . Thyroid nodule 05/16/2012   Benign, 2013, [place]   . Urge incontinence 07/05/2018  . Venous insufficiency of left leg 03/29/2016     Family History  Problem Relation Age of Onset  . Kidney cancer Father   . Pulmonary embolism Mother   . Breast cancer Maternal Grandmother   . Cancer Maternal Grandmother        breast  . Heart disease Maternal Grandfather   . Colon cancer Neg Hx   . Colon polyps Neg Hx   . Esophageal cancer Neg Hx   . Rectal cancer Neg Hx   . Stomach cancer Neg Hx      Past Surgical History:  Procedure Laterality Date  . ABDOMINAL HYSTERECTOMY    . APPENDECTOMY    . COLONOSCOPY    . ESOPHAGOGASTRODUODENOSCOPY    . EXCISION HAGLUND'S DEFORMITY WITH ACHILLES TENDON REPAIR Right 06/07/2013   Procedure: RIGHT ACHILLES DEBRIDEMENT; [id number] EXCISION;  Surgeon: Wylene Simmer, MD;  Location: [facility];  Service: Orthopedics;  Laterality: Right;  . EXCISION OF SKIN TAG  11/18/2011   Procedure: EXCISION OF SKIN TAG;  Surgeon: Leighton Ruff, MD;  Location: [place];  Service: General;  Laterality: N/A;  posterior  . GASTROCNEMIUS RECESSION Right 06/07/2013   Procedure: GASTROCNEMIUS RECESSION;  Surgeon: Wylene Simmer, MD;  Location: [facility];  Service: Orthopedics;  Laterality: Right;  . HEMORRHOID BANDING    . HEMORRHOID SURGERY  11/18/2011   Procedure: HEMORRHOIDECTOMY;  Surgeon: Leighton Ruff, MD;  Location: [facility];  Service: General;;  . LEFT OOPHORECTOMY    . LUNG LOBECTOMY  12/2011   [place] removed for carcinoid  . PLANTAR FASCIA SURGERY    . rectal fissure repair    . THYROID SURGERY     nodule and partial thyroidectomy  .  TONSILLECTOMY      Social History   Socioeconomic History  . Marital status: Married    Spouse name: Not on file  . Number of children: 1  . Years of education: Not on file  . Highest education level: Not on file  Occupational History  . Occupation: homemaker  Tobacco Use  . Smoking status: Never Smoker  . Smokeless tobacco: Never Used  Substance and Sexual Activity  . Alcohol use: Yes    Alcohol/week: 0.0 standard drinks    Comment: wine-occ  . Drug use: No  . Sexual activity: Yes    Birth control/protection: Surgical  Other Topics Concern  . Not on file  Social History Narrative  She is married, she is a homemaker, she has 1 son who is a Sport and exercise psychologist, he is an Engineer, mining in [place]   Very occasional alcohol never smoker no tobacco no drug use   Social Determinants of Radio broadcast assistant Strain:   . Difficulty of Paying Living Expenses:   Food Insecurity:   . Worried About Charity fundraiser in the Last Year:   . Arboriculturist in the Last Year:   Transportation Needs:   . Film/video editor (Medical):   Marland Kitchen Lack of Transportation (Non-Medical):   Physical Activity:   . Days of Exercise per Week:   . Minutes of Exercise per Session:   Stress:   . Feeling of Stress :   Social Connections:   . Frequency of Communication with Friends and Family:   . Frequency of Social Gatherings with Friends and Family:   . Attends Religious Services:   . Active Member of Clubs or Organizations:   . Attends Archivist Meetings:   Marland Kitchen Marital Status:   Intimate Partner Violence:   . Fear of Current or Ex-Partner:   . Emotionally Abused:   Marland Kitchen Physically Abused:   . Sexually Abused:      Allergies  Allergen Reactions  . Codeine Nausea And Vomiting     Immunization History  Administered Date(s) Administered  . H1N1 01/29/2008  . Influenza Inj Mdck Quad Pf 10/13/2016, 10/27/2017, 10/18/2018  . Influenza,inj,Quad PF,6+ Mos  11/05/2014  . Influenza,inj,quad, With Preservative 10/27/2017  . Influenza-Unspecified 10/11/2008, 10/15/2013, 11/05/2014, 09/30/2015, 10/11/2017, 10/26/2018  . PFIZER SARS-COV-2 Vaccination 02/19/2019, 03/13/2019  . Pneumococcal Conjugate-13 12/12/2014  . Pneumococcal Polysaccharide-23 01/12/2012  . Td 12/12/2014  . Zoster 12/08/2015  . Zoster Recombinat (Shingrix) 07/10/2017, 08/09/2017    Outpatient Medications Prior to Visit  Medication Sig Dispense Refill  . albuterol (VENTOLIN HFA) 108 (90 Base) MCG/ACT inhaler Inhale 2 puffs into the lungs every 6 (six) hours as needed for wheezing or shortness of breath. 18 g 2  . aspirin 81 MG tablet Take 81 mg by mouth daily.    . Biotin 5000 MCG CAPS Take 5,000 mcg by mouth daily with breakfast.    . Cholecalciferol (VITAMIN D-3) 25 MCG (1000 UT) CAPS Take 1,000 Units by mouth daily with breakfast.     . clobetasol (TEMOVATE) 0.05 % external solution Apply 1 application topically as needed.    . Esomeprazole Magnesium (NEXIUM 24HR) 20 MG TBEC Take 1 tablet by mouth daily.    . fluticasone furoate-vilanterol (BREO ELLIPTA) 200-25 MCG/INH AEPB Inhale 1 puff into the lungs daily. 1 each 5  . fluticasone furoate-vilanterol (BREO ELLIPTA) 200-25 MCG/INH AEPB Inhale 1 puff into the lungs daily. 28 each 0  . levothyroxine (SYNTHROID) 125 MCG tablet TAKE ONE TABLET BY MOUTH EVERY MORNING 90 tablet 0  . Methylcellulose, Laxative, (CITRUCEL) 500 MG TABS Take 1 tablet by mouth daily.    . metoprolol tartrate (LOPRESSOR) 100 MG tablet Take 1 tablet (100 mg total) by mouth once for 1 dose. Take (1) tablet 2 hours before your coronary CT scan 1 tablet 0  . Multiple Vitamins-Minerals (CENTRUM SILVER) tablet Take 1 tablet by mouth daily with breakfast.     . naproxen sodium (ALEVE) 220 MG tablet Take 220-440 mg by mouth as needed (for pain or headaches).     . rosuvastatin (CRESTOR) 10 MG tablet TAKE ONE (1) TABLET BY MOUTH EACH DAY 90 tablet 0   No  facility-administered  medications prior to visit.    Review of Systems  Constitutional: Negative.   HENT: Negative for congestion.   Respiratory: Positive for cough, shortness of breath and wheezing. Negative for sputum production.   Cardiovascular: Negative for chest pain and leg swelling.  Gastrointestinal: Negative for blood in stool and melena.  Genitourinary: Negative for hematuria.  Neurological: Negative for weakness.  Endo/Heme/Allergies: Negative for environmental allergies. Does not bruise/bleed easily.     Objective:   Vitals:   04/17/19 0913  BP: 120/78  Pulse: 66  Temp: (!) 97.3 F (36.3 C)  TempSrc: Temporal  SpO2: 98%  Weight: 260 lb 6.4 oz (118.1 kg)  Height: 5\' 8"  (1.727 m)   98% on   RA BMI Readings from Last 3 Encounters:  04/17/19 39.59 kg/m  03/23/19 40.29 kg/m  03/14/19 40.59 kg/m   Wt Readings from Last 3 Encounters:  04/17/19 260 lb 6.4 oz (118.1 kg)  03/23/19 265 lb (120.2 kg)  03/14/19 265 lb (120.2 kg)    Physical Exam Vitals reviewed.  Constitutional:      Appearance: Normal appearance. She is obese. She is not ill-appearing.  HENT:     Head: Normocephalic and atraumatic.  Eyes:     General: No scleral icterus. Cardiovascular:     Rate and Rhythm: Normal rate and regular rhythm.     Heart sounds: No murmur.  Pulmonary:     Comments: Breathing comfortably on room air, no conversational dyspnea.  Clear to auscultation bilaterally. Abdominal:     General: There is no distension.     Palpations: Abdomen is soft.  Musculoskeletal:        General: No swelling or deformity.     Cervical back: Neck supple.  Lymphadenopathy:     Cervical: No cervical adenopathy.  Skin:    General: Skin is warm and dry.     Findings: No rash.  Neurological:     General: No focal deficit present.     Mental Status: She is alert.     Coordination: Coordination normal.  Psychiatric:        Mood and Affect: Mood normal.        Behavior: Behavior  normal.      CBC    Component Value Date/Time   WBC 7.3 08/24/2018 0919   WBC 11.9 (H) 08/10/2018 0829   RBC 4.57 08/24/2018 0919   RBC 4.41 08/10/2018 0829   HGB 14.6 08/24/2018 0919   HCT 44.1 08/24/2018 0919   PLT 182 08/24/2018 0919   MCV 97 08/24/2018 0919   MCH 31.9 08/24/2018 0919   MCH 32.0 08/10/2018 0829   MCHC 33.1 08/24/2018 0919   MCHC 32.9 08/10/2018 0829   RDW 12.6 08/24/2018 0919   LYMPHSABS 1.9 08/24/2018 0919   MONOABS 0.4 07/16/2013 0705   EOSABS 0.1 08/24/2018 0919   BASOSABS 0.0 08/24/2018 0919    CHEMISTRY No results for input(s): NA, K, CL, CO2, GLUCOSE, BUN, CREATININE, CALCIUM, MG, PHOS in the last 168 hours. CrCl cannot be calculated (Patient's most recent lab result is older than the maximum 21 days allowed.).   Chest Imaging- films reviewed: CXR, 2 view 03/12/2019- rounded density on lateral view anterior to carina, possibly prominent pulmonary artery, similar in appearance to CXR in 2012.  Excessive lucency in the posterior lower hemithorax.  CT chest 2013-left lower lobe nodule 1.4 x 1.0 cm, subpleural nodule in the right.  Subcentimeter left upper lobe nodule.  Well-circumscribed density in the mediastinum just anterior to the  carina.  Reports from CT scans at [facility], most recently 2018 reviewed-postoperative changes from left lower lobectomy without evidence of recurrent disease.  Scattered pulmonary nodules-LUL 3 mm stable, LUL 8 mm stable, RLL 3 mm stable.  Other scattered nodules not further described.  CT chest 03/28/2019- right upper lobe 8 x 29mm subpleural nodule in the anterior apex.  Postsurgical changes of the left lower lobectomy.  Left upper lobe nodule, minimally changed from 2013.  Right lower lobe calcified granuloma and small subpleural nodule, stable.  Pulmonary Functions Testing Results: PFT Results Latest Ref Rng & Units 04/05/2019  FVC-Pre L 2.38  FVC-Predicted Pre % 63  FVC-Post L 2.55  FVC-Predicted Post % 68   Pre FEV1/FVC % % 73  Post FEV1/FCV % % 76  FEV1-Pre L 1.73  FEV1-Predicted Pre % 60  FEV1-Post L 1.92  DLCO UNC% % 110  DLCO COR %Predicted % 146  TLC L 4.23  TLC % Predicted % 74  RV % Predicted % 76    2021- mild restriction, no significant obstruction.  Incomplete bronchodilator reversibility.  PFT 12/20/2011 at [facility]: FVC 3.12 (84%) FEV1 2.09 (72%) Ratio 67 TLC 5.08 (90%) RV 1.96 (93%) CO 29.6 (145%) Moderate obstruction, no restriction or diffusion impairment.   Echocardiogram 08/31/2018: LVEF 55 to 60%, mild asymmetric hypertrophy with diastolic dysfunction.  Normal LA, RV, RA.  Trivial MR.  Trivial TR, valve leaflets moderately thickened.  Otherwise normal valves.  Trivial pericardial effusion.      Assessment & Plan:     ICD-10-CM   1. Pulmonary nodule  R91.1   2. DOE (dyspnea on exertion)  R06.00     Dyspnea on exertion and associated wheezing.  Previous PFTs demonstrated obstruction, but no history of asthma treatment.  Repeat PFTs on bronchodilators show borderline obstruction and bronchodilator reversibility.  This in combination with symptomatic improvement on LABA/ICS confirms a diagnosis of asthma. -Continue Breo once daily.   -Start Singulair once daily.  Depending on how her symptoms are doing, would consider de-escalation if she is stable over 3 months.  She may be able to be de-escalate it to Singulair with Flovent. -Continue albuterol as needed -Reviewed PFTs and CT scan today in the office. -I will defer whether or not she needs additional cardiac testing to the cardiologist to which she was referred.  If her symptoms are significantly improved, she may no longer need additional cardiac testing.  Pulmonary nodule with history of pulmonary carcinoid. -Reviewed chest CT in the office.  Recommend 3 to 46-month follow-up CT scan to evaluate right upper lobe subpleural lesion.  Other nodules are stable for many years.  RTC in 3  months.    Current Outpatient Medications:  .  albuterol (VENTOLIN HFA) 108 (90 Base) MCG/ACT inhaler, Inhale 2 puffs into the lungs every 6 (six) hours as needed for wheezing or shortness of breath., Disp: 18 g, Rfl: 2 .  aspirin 81 MG tablet, Take 81 mg by mouth daily., Disp: , Rfl:  .  Biotin 5000 MCG CAPS, Take 5,000 mcg by mouth daily with breakfast., Disp: , Rfl:  .  Cholecalciferol (VITAMIN D-3) 25 MCG (1000 UT) CAPS, Take 1,000 Units by mouth daily with breakfast. , Disp: , Rfl:  .  clobetasol (TEMOVATE) 0.05 % external solution, Apply 1 application topically as needed., Disp: , Rfl:  .  Esomeprazole Magnesium (NEXIUM 24HR) 20 MG TBEC, Take 1 tablet by mouth daily., Disp: , Rfl:  .  fluticasone furoate-vilanterol (BREO ELLIPTA) 200-25  MCG/INH AEPB, Inhale 1 puff into the lungs daily., Disp: 1 each, Rfl: 5 .  fluticasone furoate-vilanterol (BREO ELLIPTA) 200-25 MCG/INH AEPB, Inhale 1 puff into the lungs daily., Disp: 28 each, Rfl: 0 .  levothyroxine (SYNTHROID) 125 MCG tablet, TAKE ONE TABLET BY MOUTH EVERY MORNING, Disp: 90 tablet, Rfl: 0 .  Methylcellulose, Laxative, (CITRUCEL) 500 MG TABS, Take 1 tablet by mouth daily., Disp: , Rfl:  .  metoprolol tartrate (LOPRESSOR) 100 MG tablet, Take 1 tablet (100 mg total) by mouth once for 1 dose. Take (1) tablet 2 hours before your coronary CT scan, Disp: 1 tablet, Rfl: 0 .  Multiple Vitamins-Minerals (CENTRUM SILVER) tablet, Take 1 tablet by mouth daily with breakfast. , Disp: , Rfl:  .  naproxen sodium (ALEVE) 220 MG tablet, Take 220-440 mg by mouth as needed (for pain or headaches). , Disp: , Rfl:  .  rosuvastatin (CRESTOR) 10 MG tablet, TAKE ONE (1) TABLET BY MOUTH EACH DAY, Disp: 90 tablet, Rfl: 0    Julian Hy, DO [facility] 04/17/2019 9:36 AM

## 2019-04-20 ENCOUNTER — Encounter (HOSPITAL_COMMUNITY): Payer: Self-pay

## 2019-04-20 ENCOUNTER — Telehealth (HOSPITAL_COMMUNITY): Payer: Self-pay | Admitting: Emergency Medicine

## 2019-04-20 NOTE — Telephone Encounter (Signed)
Attempted to call patient regarding upcoming cardiac CT appointment. °Left message on voicemail with name and callback number °Mahasin Riviere RN Navigator Cardiac Imaging °[facility] °[phone] Office °[phone] Cell ° °

## 2019-04-23 ENCOUNTER — Ambulatory Visit (HOSPITAL_COMMUNITY)
Admission: RE | Admit: 2019-04-23 | Discharge: 2019-04-23 | Disposition: A | Discharge status: Home / Self Care | Payer: BC Managed Care – PPO | Source: Ambulatory Visit | Attending: Cardiology | Admitting: Cardiology

## 2019-04-23 ENCOUNTER — Telehealth: Payer: Self-pay

## 2019-04-23 ENCOUNTER — Other Ambulatory Visit: Payer: Self-pay

## 2019-04-23 DIAGNOSIS — I2510 Atherosclerotic heart disease of native coronary artery without angina pectoris: Secondary | ICD-10-CM | POA: Diagnosis not present

## 2019-04-23 DIAGNOSIS — R0600 Dyspnea, unspecified: Secondary | ICD-10-CM | POA: Diagnosis not present

## 2019-04-23 DIAGNOSIS — R0609 Other forms of dyspnea: Secondary | ICD-10-CM

## 2019-04-23 MED ORDER — NITROGLYCERIN 0.4 MG SL SUBL
SUBLINGUAL_TABLET | SUBLINGUAL | Status: AC
  Administered 2019-04-23: 0.8 mg via SUBLINGUAL
  Filled 2019-04-23: qty 2

## 2019-04-23 MED ORDER — IOHEXOL 350 MG/ML SOLN
80.0000 mL | Freq: Once | INTRAVENOUS | Status: AC | PRN
  Administered 2019-04-23: 10:00:00 80 mL via INTRAVENOUS

## 2019-04-23 MED ORDER — ROSUVASTATIN CALCIUM 10 MG PO TABS: 20.0000 mg | ORAL_TABLET | Freq: Every day | ORAL | 3 refills | Status: DC

## 2019-04-23 MED ORDER — NITROGLYCERIN 0.4 MG SL SUBL: 0.8000 mg | SUBLINGUAL_TABLET | Freq: Once | SUBLINGUAL | Status: AC

## 2019-04-23 NOTE — Telephone Encounter (Signed)
The patient has been notified of the Coronary CT result and verbalized understanding.  All questions (if any) were answered. Frederik Schmidt, RN 04/23/2019 3:17 PM

## 2019-04-23 NOTE — Telephone Encounter (Signed)
-----   Message from Jerline Pain, MD sent at 04/23/2019  2:54 PM EDT ----- There is proximal LAD calcified plaque but this appears nonflow limiting.  Calcium score is 102 which is 84th percentile.  Based upon these findings, I would suggest increasing her Crestor to 20 mg a day, high intensity dose.  Her goal LDL will be less than 70.   I will discuss further with her and her upcoming appointment in May.   Candee Furbish, MD

## 2019-04-24 ENCOUNTER — Other Ambulatory Visit: Payer: Self-pay

## 2019-04-24 MED ORDER — ROSUVASTATIN CALCIUM 20 MG PO TABS: 20.0000 mg | ORAL_TABLET | Freq: Every day | ORAL | 3 refills | Status: DC

## 2019-04-24 NOTE — Telephone Encounter (Signed)
Pt calling stating that Dr. Marlou Porch increased her Rosuvastatin to 20 mg tablets daily. This Rx was not sent to pt's pharmacy and pt states that she is almost out of medications. Please address

## 2019-05-21 ENCOUNTER — Ambulatory Visit: Payer: BC Managed Care – PPO | Admitting: Cardiology

## 2019-05-21 ENCOUNTER — Encounter: Payer: Self-pay | Admitting: Cardiology

## 2019-05-21 ENCOUNTER — Other Ambulatory Visit: Payer: Self-pay

## 2019-05-21 VITALS — BP 124/80 | HR 75 | Ht 68.0 in | Wt 264.0 lb

## 2019-05-21 DIAGNOSIS — R0600 Dyspnea, unspecified: Secondary | ICD-10-CM

## 2019-05-21 DIAGNOSIS — E782 Mixed hyperlipidemia: Secondary | ICD-10-CM | POA: Diagnosis not present

## 2019-05-21 DIAGNOSIS — I10 Essential (primary) hypertension: Secondary | ICD-10-CM | POA: Diagnosis not present

## 2019-05-21 DIAGNOSIS — R0609 Other forms of dyspnea: Secondary | ICD-10-CM

## 2019-05-21 NOTE — Progress Notes (Signed)
Cardiology Office Note:    Date:  05/21/2019   ID:  [name], DOB 08/29/1955, MRN [id number]  PCP:  Kathyrn Drown, MD  Cardiologist:  Candee Furbish, MD  Electrophysiologist:  None   Referring MD: Kathyrn Drown, MD     History of Present Illness:    Debbie Miranda is a 64 y.o. female here for follow-up of mild nonobstructive coronary artery disease detected on coronary CT scan 04/23/2019: IMPRESSION: 1. Coronary calcium score of 102. This was 110 percentile for age and sex matched control.  2. Normal coronary origin with right dominance.  3. Proximal LAD calcified plaque with non flow limiting stenosis (25-49%). CAD-RADS 2  4.  Aortic arch atherosclerosis.  Because of the calcium score and mild CAD, we increased her Crestor to 20 mg a day, high intensity dose to get her goal LDL less than 70.  Originally was seen for ongoing dyspnea on exertion at the request of Dr. Wolfgang Phoenix.  She felt as though after 10 minutes of walking even at a slow pace she begins to feel very short of breath.  Struggling with a flight of stairs.  D-dimer previously normal.  She does have a prior history of lobectomy.  Echocardiogram showed normal EF.  After coronary CT, this helped elucidate her shortness of breath as a result of underlying lung issues.  PFTs were performed by pulmonary, Dr. Vivia Ewing, [facility]    Past Medical History:  Diagnosis Date  . Acute ischemic colitis ([place]) 08/09/2018  . Anal itching 10/27/2011  . Arthritis   . Cancer ([place])    lung-7years ago  . 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 Cataract    beginning stages,bilateral  . Contact lens/glasses fitting    wares contacts or glasses  . Corn of toe 5th right 08/16/2012  . Costochondritis   . Diverticulitis 2012  . Dysplastic nevi   . Esophageal reflux   . External hemorrhoids without mention of complication [id number]  . GERD with stricture 05/16/2012  . Headache(784.0)   . HTN (hypertension) 08/09/2018  .  Hyperlipidemia   . Hypertension    pt denies  . Hypothyroidism   . IFG (impaired fasting glucose)   . Impaired fasting glucose 05/16/2012  . Mycotic toenails 12/10/2010   1st toenail right-lasered nail 12/10/2010-present (last on 06/15/2012)  . Neuropathic pain 06/30/2016  . Obesity   . Personal history of colonic polyps 07/30/2015   SSA  . PONV (postoperative nausea and vomiting)   . Positive H. pylori test   . Thyroid nodule 05/16/2012   Benign, 2013, [place]   . Urge incontinence 07/05/2018  . Venous insufficiency of left leg 03/29/2016    Past Surgical History:  Procedure Laterality Date  . ABDOMINAL HYSTERECTOMY    . APPENDECTOMY    . COLONOSCOPY    . ESOPHAGOGASTRODUODENOSCOPY    . EXCISION HAGLUND'S DEFORMITY WITH ACHILLES TENDON REPAIR Right 06/07/2013   Procedure: RIGHT ACHILLES DEBRIDEMENT; [id number] EXCISION;  Surgeon: Wylene Simmer, MD;  Location: [facility];  Service: Orthopedics;  Laterality: Right;  . EXCISION OF SKIN TAG  11/18/2011   Procedure: EXCISION OF SKIN TAG;  Surgeon: Leighton Ruff, MD;  Location: [facility];  Service: General;  Laterality: N/A;  posterior  . GASTROCNEMIUS RECESSION Right 06/07/2013   Procedure: GASTROCNEMIUS RECESSION;  Surgeon: Wylene Simmer, MD;  Location: [facility];  Service: Orthopedics;  Laterality: Right;  . HEMORRHOID BANDING    . HEMORRHOID  SURGERY  11/18/2011   Procedure: HEMORRHOIDECTOMY;  Surgeon: Leighton Ruff, MD;  Location: [facility];  Service: General;;  . LEFT OOPHORECTOMY    . LUNG LOBECTOMY  12/2011   [place] removed for carcinoid  . PLANTAR FASCIA SURGERY    . rectal fissure repair    . THYROID SURGERY     nodule and partial thyroidectomy  . TONSILLECTOMY      Current Medications: Current Meds  Medication Sig  . albuterol (VENTOLIN HFA) 108 (90 Base) MCG/ACT inhaler Inhale 2 puffs into the lungs every 6 (six) hours as needed for wheezing or  shortness of breath.  Marland Kitchen aspirin 81 MG tablet Take 81 mg by mouth daily.  . Biotin 5000 MCG CAPS Take 5,000 mcg by mouth daily with breakfast.  . Cholecalciferol (VITAMIN D-3) 25 MCG (1000 UT) CAPS Take 1,000 Units by mouth daily with breakfast.   . clobetasol (TEMOVATE) 0.05 % external solution Apply 1 application topically as needed.  . Esomeprazole Magnesium (NEXIUM 24HR) 20 MG TBEC Take 1 tablet by mouth daily.  . fluticasone furoate-vilanterol (BREO ELLIPTA) 200-25 MCG/INH AEPB Inhale 1 puff into the lungs daily.  . fluticasone furoate-vilanterol (BREO ELLIPTA) 200-25 MCG/INH AEPB Inhale 1 puff into the lungs daily.  Marland Kitchen levothyroxine (SYNTHROID) 125 MCG tablet TAKE ONE TABLET BY MOUTH EVERY MORNING  . Methylcellulose, Laxative, (CITRUCEL) 500 MG TABS Take 1 tablet by mouth daily.  . montelukast (SINGULAIR) 10 MG tablet Take 1 tablet (10 mg total) by mouth daily before breakfast.  . Multiple Vitamins-Minerals (CENTRUM SILVER) tablet Take 1 tablet by mouth daily with breakfast.   . naproxen sodium (ALEVE) 220 MG tablet Take 220-440 mg by mouth as needed (for pain or headaches).   . rosuvastatin (CRESTOR) 20 MG tablet Take 1 tablet (20 mg total) by mouth daily.     Allergies:   Codeine   Social History   Socioeconomic History  . Marital status: Married    Spouse name: Not on file  . Number of children: 1  . Years of education: Not on file  . Highest education level: Not on file  Occupational History  . Occupation: homemaker  Tobacco Use  . Smoking status: Never Smoker  . Smokeless tobacco: Never Used  Substance and Sexual Activity  . Alcohol use: Yes    Alcohol/week: 0.0 standard drinks    Comment: wine-occ  . Drug use: No  . Sexual activity: Yes    Birth control/protection: Surgical  Other Topics Concern  . Not on file  Social History Narrative   She is married, she is a homemaker, she has 1 son who is a Sport and exercise psychologist, he is an Engineer, mining in [place]   Very occasional alcohol never smoker no tobacco no drug use   Social Determinants of Radio broadcast assistant Strain:   . Difficulty of Paying Living Expenses:   Food Insecurity:   . Worried About Charity fundraiser in the Last Year:   . Arboriculturist in the Last Year:   Transportation Needs:   . Film/video editor (Medical):   Marland Kitchen Lack of Transportation (Non-Medical):   Physical Activity:   . Days of Exercise per Week:   . Minutes of Exercise per Session:   Stress:   . Feeling of Stress :   Social Connections:   . Frequency of Communication with Friends and Family:   . Frequency of Social Gatherings with Friends and Family:   .  Attends Religious Services:   . Active Member of Clubs or Organizations:   . Attends Archivist Meetings:   Marland Kitchen Marital Status:      Family History: The patient's family history includes Breast cancer in her maternal grandmother; Cancer in her maternal grandmother; Heart disease in her maternal grandfather; Kidney cancer in her father; Pulmonary embolism in her mother. There is no history of Colon cancer, Colon polyps, Esophageal cancer, Rectal cancer, or Stomach cancer.  ROS:   Please see the history of present illness.    No fevers chills nausea vomiting syncope bleeding all other systems reviewed and are negative.  EKGs/Labs/Other Studies Reviewed:    The following studies were reviewed today: As above CT scan Recent Labs: 08/09/2018: ALT 20 08/24/2018: Hemoglobin 14.6; Magnesium 2.2; Platelets 182; TSH 1.450 03/12/2019: BUN 14; Creatinine, Ser 0.79; Potassium 4.8; Sodium 136  Recent Lipid Panel    Component Value Date/Time   CHOL 160 06/16/2018 0803   TRIG 95 06/16/2018 0803   HDL 65 06/16/2018 0803   CHOLHDL 2.5 06/16/2018 0803   CHOLHDL 3.1 07/16/2013 0705   VLDL 18 07/16/2013 0705   LDLCALC 76 06/16/2018 0803    Physical Exam:    VS:  BP 124/80   Pulse 75   Ht 5\' 8"  (1.727 m)   Wt 264 lb (119.7 kg)    SpO2 96%   BMI 40.14 kg/m     Wt Readings from Last 3 Encounters:  05/21/19 264 lb (119.7 kg)  04/17/19 260 lb 6.4 oz (118.1 kg)  03/23/19 265 lb (120.2 kg)     GEN:  Well nourished, well developed in no acute distress HEENT: Normal NECK: No JVD; No carotid bruits LYMPHATICS: No lymphadenopathy CARDIAC: RRR, no murmurs, rubs, gallops RESPIRATORY:  Clear to auscultation without rales, wheezing or rhonchi  ABDOMEN: Soft, non-tender, non-distended MUSCULOSKELETAL:  No edema; No deformity  SKIN: Warm and dry NEUROLOGIC:  Alert and oriented x 3 PSYCHIATRIC:  Normal affect   ASSESSMENT:    1. Dyspnea on exertion   2. Essential hypertension   3. Mixed hyperlipidemia    PLAN:    In order of problems listed above:  Nonobstructive coronary artery disease -Elevated calcium score, continue with high intensity statin for prevention.  We increased her Crestor dose.  Discussed at length.  I am fine with seeing her back on as-needed basis.  Asthma/prior lobectomy -Pulmonology note reviewed.  Was placed on Breo.  This should help with shortness of breath.  Aortic atherosclerosis -Continuing with high intensity statin.   Medication Adjustments/Labs and Tests Ordered: Current medicines are reviewed at length with the patient today.  Concerns regarding medicines are outlined above.  No orders of the defined types were placed in this encounter.  No orders of the defined types were placed in this encounter.   Patient Instructions  Medication Instructions:  The current medical regimen is effective;  continue present plan and medications.  *If you need a refill on your cardiac medications before your next appointment, please call your pharmacy*  Follow-Up: Follow up as needed with Dr Marlou Porch.  At [facility], you and your health needs are our priority.  As part of our continuing mission to provide you with exceptional heart care, we have created designated Provider Care Teams.   These Care Teams include your primary Cardiologist (physician) and Advanced Practice Providers (APPs -  Physician Assistants and Nurse Practitioners) who all work together to provide you with the care you need, when you need  it.  We recommend signing up for the patient portal called "MyChart".  Sign up information is provided on this After Visit Summary.  MyChart is used to connect with patients for Virtual Visits (Telemedicine).  Patients are able to view lab/test results, encounter notes, upcoming appointments, etc.  Non-urgent messages can be sent to your provider as well.   To learn more about what you can do with MyChart, go to [email or web address].    Thank you for choosing [facility]!!         Signed, Candee Furbish, MD  05/21/2019 10:28 AM    [place]

## 2019-05-21 NOTE — Patient Instructions (Signed)
Medication Instructions:  The current medical regimen is effective;  continue present plan and medications.  *If you need a refill on your cardiac medications before your next appointment, please call your pharmacy*  Follow-Up: Follow up as needed with Dr Marlou Porch.  At [facility], you and your health needs are our priority.  As part of our continuing mission to provide you with exceptional heart care, we have created designated Provider Care Teams.  These Care Teams include your primary Cardiologist (physician) and Advanced Practice Providers (APPs -  Physician Assistants and Nurse Practitioners) who all work together to provide you with the care you need, when you need it.  We recommend signing up for the patient portal called "MyChart".  Sign up information is provided on this After Visit Summary.  MyChart is used to connect with patients for Virtual Visits (Telemedicine).  Patients are able to view lab/test results, encounter notes, upcoming appointments, etc.  Non-urgent messages can be sent to your provider as well.   To learn more about what you can do with MyChart, go to [email or web address].    Thank you for choosing [facility]!!

## 2019-06-14 ENCOUNTER — Encounter: Payer: Self-pay | Admitting: Family Medicine

## 2019-06-14 ENCOUNTER — Telehealth (INDEPENDENT_AMBULATORY_CARE_PROVIDER_SITE_OTHER): Payer: BC Managed Care – PPO | Admitting: Family Medicine

## 2019-06-14 ENCOUNTER — Other Ambulatory Visit: Payer: Self-pay

## 2019-06-14 ENCOUNTER — Telehealth: Payer: Self-pay | Admitting: Family Medicine

## 2019-06-14 DIAGNOSIS — J40 Bronchitis, not specified as acute or chronic: Secondary | ICD-10-CM

## 2019-06-14 MED ORDER — AZITHROMYCIN 250 MG PO TABS: ORAL_TABLET | ORAL | 0 refills | Status: DC

## 2019-06-14 NOTE — Progress Notes (Signed)
Patient ID: Debbie Miranda, female    DOB: 1955/03/13, 64 y.o.   MRN: [id number]   Virtual Visit via Telephone Note  I connected with Debbie Miranda on 06/14/19 at 11:30 AM EDT by telephone and verified that I am speaking with the correct person using two identifiers.  Location: Patient: home Provider: office    I discussed the limitations, risks, security and privacy concerns of performing an evaluation and management service by telephone and the availability of in person appointments. I also discussed with the patient that there may be a patient responsible charge related to this service. The patient expressed understanding and agreed to proceed.   Chief Complaint  Patient presents with  . Cough    Pt having productive cough. Began on Tuesday and is now able to taste mucus in throat. Pt has also had some chest tightness at night. Sore throat on Tuesday morning and no voice this morning upon awaking. Pt has not tried any treatments for symptoms.    Subjective:    HPI Has been feeling ill for past 2-3 days.  Has been diagnosed in last few months with asthma. Started on [place] and is using this daily and has rescue inhaler.  Not using the albuterol at this time. Not taking any medications for illness.  No fever.  Had sick contact last week. Having some sore throat, productive coughing with wheezing and chest tightness.  Sputum has bad taste to it.  Having some hoarseness today.   Medical History Debbie Miranda has a past medical history of Acute ischemic colitis ([place]) (08/09/2018), Anal itching (10/27/2011), Arthritis, Cancer ([place]), Carcinoid tumor of lung (2013), Cataract, Contact lens/glasses fitting, Corn of toe 5th right (08/16/2012), Costochondritis, Diverticulitis (2012), Dysplastic nevi, Esophageal reflux, External hemorrhoids without mention of complication ([id number]), GERD with stricture (05/16/2012), Headache(784.0), HTN (hypertension) (08/09/2018), Hyperlipidemia, Hypertension, Hypothyroidism,  IFG (impaired fasting glucose), Impaired fasting glucose (05/16/2012), Mycotic toenails (12/10/2010), Neuropathic pain (06/30/2016), Obesity, Personal history of colonic polyps (07/30/2015), PONV (postoperative nausea and vomiting), Positive H. pylori test, Thyroid nodule (05/16/2012), Urge incontinence (07/05/2018), and Venous insufficiency of left leg (03/29/2016).   Outpatient Encounter Medications as of 06/14/2019  Medication Sig  . albuterol (VENTOLIN HFA) 108 (90 Base) MCG/ACT inhaler Inhale 2 puffs into the lungs every 6 (six) hours as needed for wheezing or shortness of breath.  Marland Kitchen aspirin 81 MG tablet Take 81 mg by mouth daily.  . Biotin 5000 MCG CAPS Take 5,000 mcg by mouth daily with breakfast.  . Cholecalciferol (VITAMIN D-3) 25 MCG (1000 UT) CAPS Take 1,000 Units by mouth daily with breakfast.   . clobetasol (TEMOVATE) 0.05 % external solution Apply 1 application topically as needed.  . Esomeprazole Magnesium (NEXIUM 24HR) 20 MG TBEC Take 1 tablet by mouth daily.  . fluticasone furoate-vilanterol (BREO ELLIPTA) 200-25 MCG/INH AEPB Inhale 1 puff into the lungs daily.  . fluticasone furoate-vilanterol (BREO ELLIPTA) 200-25 MCG/INH AEPB Inhale 1 puff into the lungs daily.  Marland Kitchen levothyroxine (SYNTHROID) 125 MCG tablet TAKE ONE TABLET BY MOUTH EVERY MORNING  . Methylcellulose, Laxative, (CITRUCEL) 500 MG TABS Take 1 tablet by mouth daily.  . montelukast (SINGULAIR) 10 MG tablet Take 1 tablet (10 mg total) by mouth daily before breakfast.  . Multiple Vitamins-Minerals (CENTRUM SILVER) tablet Take 1 tablet by mouth daily with breakfast.   . naproxen sodium (ALEVE) 220 MG tablet Take 220-440 mg by mouth as needed (for pain or headaches).   . rosuvastatin (CRESTOR) 20 MG tablet Take 1 tablet (20  mg total) by mouth daily.  Marland Kitchen azithromycin (ZITHROMAX) 250 MG tablet Take 2 tab p.o. day 1, and then take 1 tab p.o. daily for 4 more days.   No facility-administered encounter medications on file as of 06/14/2019.     Review of Systems  Constitutional: Negative for chills and fever.  HENT: Positive for postnasal drip, sore throat and voice change. Negative for congestion, ear pain, rhinorrhea, sinus pressure, sinus pain and sneezing.   Eyes: Negative for pain, discharge and itching.  Respiratory: Positive for cough, chest tightness and wheezing. Negative for shortness of breath.   Gastrointestinal: Negative for constipation, diarrhea, nausea and vomiting.  Skin: Negative for rash.    Vitals There were no vitals taken for this visit.  Objective:   Physical Exam  No PE due to phone visit.  Assessment and Plan   1. Bronchitis - azithromycin (ZITHROMAX) 250 MG tablet; Take 2 tab p.o. day 1, and then take 1 tab p.o. daily for 4 more days.  Dispense: 6 tablet; Refill: 0   Reviewed viral uri vs. Bacterial infection and the usual course. Gave watch and wait script, if not improving in next few days can start the medication. Use albuterol every 4-6hrs prn. Take mucinex or robitussin and increase in fluids.  Call or rto if not improving.  Pt in agreement with plan.  F/u prn  Follow Up Instructions:    I discussed the assessment and treatment plan with the patient. The patient was provided an opportunity to ask questions and all were answered. The patient agreed with the plan and demonstrated an understanding of the instructions.   The patient was advised to call back or seek an in-person evaluation if the symptoms worsen or if the condition fails to improve as anticipated.  I provided 15 minutes of non-face-to-face time during this encounter.

## 2019-06-14 NOTE — Telephone Encounter (Signed)
[name], rygh are scheduled for a virtual visit with your provider today.    Just as we do with appointments in the office, we must obtain your consent to participate.  Your consent will be active for this visit and any virtual visit you may have with one of our providers in the next 365 days.    If you have a MyChart account, I can also send a copy of this consent to you electronically.  All virtual visits are billed to your insurance company just like a traditional visit in the office.  As this is a virtual visit, video technology does not allow for your provider to perform a traditional examination.  This may limit your provider's ability to fully assess your condition.  If your provider identifies any concerns that need to be evaluated in person or the need to arrange testing such as labs, EKG, etc, we will make arrangements to do so.    Although advances in technology are sophisticated, we cannot ensure that it will always work on either your end or our end.  If the connection with a video visit is poor, we may have to switch to a telephone visit.  With either a video or telephone visit, we are not always able to ensure that we have a secure connection.   I need to obtain your verbal consent now.   Are you willing to proceed with your visit today?   Debbie Miranda has provided verbal consent on 06/14/2019 for a virtual visit (video or telephone).   Vicente Males, LPN [id number]  [id number] AM

## 2019-06-21 ENCOUNTER — Other Ambulatory Visit: Payer: Self-pay

## 2019-06-21 ENCOUNTER — Ambulatory Visit (HOSPITAL_BASED_OUTPATIENT_CLINIC_OR_DEPARTMENT_OTHER)
Admission: RE | Admit: 2019-06-21 | Discharge: 2019-06-21 | Disposition: A | Discharge status: Home / Self Care | Payer: BC Managed Care – PPO | Source: Ambulatory Visit | Attending: Critical Care Medicine | Admitting: Critical Care Medicine

## 2019-06-21 DIAGNOSIS — Z85110 Personal history of malignant carcinoid tumor of bronchus and lung: Secondary | ICD-10-CM | POA: Diagnosis not present

## 2019-06-21 DIAGNOSIS — R918 Other nonspecific abnormal finding of lung field: Secondary | ICD-10-CM | POA: Diagnosis not present

## 2019-06-28 NOTE — Progress Notes (Signed)
Please let Debbie Miranda  know that the upper lobe spot that we are watching has remained stable.  There are some areas that look like acute inflammation on the left side, which could be related to asthma or a mild infection.  Nothing needs a biopsy luckily. Thanks! LPC

## 2019-07-02 ENCOUNTER — Telehealth: Payer: Self-pay | Admitting: Critical Care Medicine

## 2019-07-02 NOTE — Telephone Encounter (Signed)
Julian Hy, DO  06/28/2019 8:40 PM EDT     Please let Ms. Muldoon know that the upper lobe spot that we are watching has remained stable. There are some areas that look like acute inflammation on the left side, which could be related to asthma or a mild infection. Nothing needs a biopsy luckily. Thanks! LPC  ------------------------------------------------------------- Spoke with pt. She is aware of results. Nothing further was needed.

## 2019-07-11 ENCOUNTER — Other Ambulatory Visit: Payer: Self-pay | Admitting: Family Medicine

## 2019-07-11 NOTE — Telephone Encounter (Signed)
One refill, patient will need to do lab work and follow-up office visit

## 2019-07-17 ENCOUNTER — Ambulatory Visit: Payer: BC Managed Care – PPO | Admitting: Critical Care Medicine

## 2019-07-17 ENCOUNTER — Other Ambulatory Visit: Payer: Self-pay

## 2019-07-17 ENCOUNTER — Encounter: Payer: Self-pay | Admitting: Critical Care Medicine

## 2019-07-17 VITALS — BP 120/86 | HR 75 | Temp 98.3°F | Ht 68.0 in | Wt 271.8 lb

## 2019-07-17 DIAGNOSIS — J4530 Mild persistent asthma, uncomplicated: Secondary | ICD-10-CM | POA: Diagnosis not present

## 2019-07-17 DIAGNOSIS — Z85110 Personal history of malignant carcinoid tumor of bronchus and lung: Secondary | ICD-10-CM

## 2019-07-17 MED ORDER — ADVAIR HFA 230-21 MCG/ACT IN AERO: 2.0000 | INHALATION_SPRAY | Freq: Two times a day (BID) | RESPIRATORY_TRACT | 11 refills | Status: DC

## 2019-07-17 NOTE — Patient Instructions (Addendum)
Thank you for visiting Dr. Carlis Abbott at [facility]. We recommend the following:  Stop taking Breo when you start Advair two times daily.  Meds ordered this encounter  Medications  . fluticasone-salmeterol (ADVAIR HFA) 230-21 MCG/ACT inhaler    Sig: Inhale 2 puffs into the lungs 2 (two) times daily.    Dispense:  1 Inhaler    Refill:  11    Return in about 1 month (around 08/17/2019).    Please do your part to reduce the spread of COVID-19.

## 2019-07-17 NOTE — Progress Notes (Signed)
Synopsis: Referred in March 2021 for dyspnea on exertion by Babs Sciara, MD.   Subjective:   PATIENT ID: Debbie Miranda GENDER: female DOB: March 19, 1955, MRN: [id number]  Chief Complaint  Patient presents with  . Follow-up    Patient feels the same as last visit. Productive cough with clear sputum, wheezing. Patient has shortness of breath with exertion. She feels like she can never get a full good breath.     Debbie Miranda is a 64 year old woman with a history of pulmonary carcinoid and mild persistent asthma who presents for follow-up.  She has noticed improvement in her symptoms since starting Breo, but still has dyspnea on exertion.  Her symptoms do not significantly improve when using albuterol.  She had a respiratory infection about a month ago which has improved.  She had this around the time she used after CT scan a few weeks ago.  She now only has minimal clear sputum production.  Her main complaint is that she still feels as though she cannot take a full breath.  She has no previous history of allergies and is not sure why she has asthma now.   OV 04/17/19: Debbie Miranda is a 64 year old woman who presents for follow-up of shortness of breath.  Since she started on Breo her symptoms have been significantly improved.  But she is noticing she is struggling a little bit more today.  She has been outside recently, and has not noticed a difference in her symptoms if she is outside or inside.  She does not think that the pollen in the air contributes to her symptoms.  She has not been needing her albuterol since she was last seen due to how much better she is been feeling.  She is been wheezing a few times since she was last seen.  Her shortness of breath is significantly improved.  She has had some minor coughing, but no sputum production.  No nocturnal symptoms.  No previous known history of asthma.   OV 03/23/19: Debbie Miranda is a 63 year old woman who presents for evaluation of  shortness of breath.  She suddenly developed shortness of breath overnight about 10 months ago that has worsened over time, especially over the past 3 months.  It was initially dyspnea with exertion, but has now progressed to include shortness of breath at rest.  When this started she was able to walk about 45 minutes on the treadmill without stopping.  She can only walk about 15 minutes without stopping now.  If she is outside and there is any incline at all she has to stop.  She can no longer walk up the stairs without stopping.  She has wheezing at times and occasional dry cough.  No nighttime symptoms.  No significant bruising, bleeding or history of anemia.  She takes baby aspirin daily, but no blood thinners.  She recently had blood work with her PCP that she reports was normal.  No chest pain or personal history of DVTs.  Her mother had a pulmonary embolus.  She occasionally has feelings of tachycardia and leg edema.  No significant improvement or worsening with position changes.  Nothing has improved her symptoms, and activity is the only exacerbating factor. She has no history of allergies or asthma.  She has no significant inhalational exposures.  She has never tried inhalers.  Was evaluated by cardiology in the fall and was felt to have dyspnea due to her obesity.  She has been this weight before without having  significant symptoms.  She has been working on losing weight, which has been difficult.  Repeat echocardiogram and coronary CT scans have been ordered.   She has a history of a left lower lobe carcinoid that was incidentally detected and underwent left lower lobectomy in December 2013 at [facility].  No previous radiation or chemotherapy required.  She followed with Dr. Carollee Massed oncology-most recent note from 12/13/2016 reviewed.  Most recent CT scan in 2018 did not show recurrence.    Past Medical History:  Diagnosis Date  . Acute ischemic colitis (HCC) 08/09/2018  . Anal  itching 10/27/2011  . Arthritis   . Cancer (HCC)    lung-7years ago  . 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 Cataract    beginning stages,bilateral  . Contact lens/glasses fitting    wares contacts or glasses  . Corn of toe 5th right 08/16/2012  . Costochondritis   . Diverticulitis 2012  . Dysplastic nevi   . Esophageal reflux   . External hemorrhoids without mention of complication 2007  . GERD with stricture 05/16/2012  . Headache(784.0)   . HTN (hypertension) 08/09/2018  . Hyperlipidemia   . Hypertension    pt denies  . Hypothyroidism   . IFG (impaired fasting glucose)   . Impaired fasting glucose 05/16/2012  . Mycotic toenails 12/10/2010   1st toenail right-lasered nail 12/10/2010-present (last on 06/15/2012)  . Neuropathic pain 06/30/2016  . Obesity   . Personal history of colonic polyps 07/30/2015   SSA  . PONV (postoperative nausea and vomiting)   . Positive H. pylori test   . Thyroid nodule 05/16/2012   Benign, 2013, [place]   . Urge incontinence 07/05/2018  . Venous insufficiency of left leg 03/29/2016     Family History  Problem Relation Age of Onset  . Kidney cancer Father   . Pulmonary embolism Mother   . Breast cancer Maternal Grandmother   . Cancer Maternal Grandmother        breast  . Heart disease Maternal Grandfather   . Colon cancer Neg Hx   . Colon polyps Neg Hx   . Esophageal cancer Neg Hx   . Rectal cancer Neg Hx   . Stomach cancer Neg Hx      Past Surgical History:  Procedure Laterality Date  . ABDOMINAL HYSTERECTOMY    . APPENDECTOMY    . COLONOSCOPY    . ESOPHAGOGASTRODUODENOSCOPY    . EXCISION HAGLUND'S DEFORMITY WITH ACHILLES TENDON REPAIR Right 06/07/2013   Procedure: RIGHT ACHILLES DEBRIDEMENT; NWGNFAO EXCISION;  Surgeon: Toni Arthurs, MD;  Location: [facility];  Service: Orthopedics;  Laterality: Right;  . EXCISION OF SKIN TAG  11/18/2011   Procedure: EXCISION OF SKIN TAG;  Surgeon: Romie Levee, MD;  Location: [facility]  [place];  Service: General;  Laterality: N/A;  posterior  . GASTROCNEMIUS RECESSION Right 06/07/2013   Procedure: GASTROCNEMIUS RECESSION;  Surgeon: Toni Arthurs, MD;  Location: [facility];  Service: Orthopedics;  Laterality: Right;  . HEMORRHOID BANDING    . HEMORRHOID SURGERY  11/18/2011   Procedure: HEMORRHOIDECTOMY;  Surgeon: Romie Levee, MD;  Location: [facility];  Service: General;;  . LEFT OOPHORECTOMY    . LUNG LOBECTOMY  12/2011   [facility] , LLLobe removed for carcinoid  . PLANTAR FASCIA SURGERY    . rectal fissure repair    . THYROID SURGERY     nodule and partial thyroidectomy  . TONSILLECTOMY  Social History   Socioeconomic History  . Marital status: Married    Spouse name: Not on file  . Number of children: 1  . Years of education: Not on file  . Highest education level: Not on file  Occupational History  . Occupation: homemaker  Tobacco Use  . Smoking status: Never Smoker  . Smokeless tobacco: Never Used  Vaping Use  . Vaping Use: Never used  Substance and Sexual Activity  . Alcohol use: Yes    Alcohol/week: 0.0 standard drinks    Comment: wine-occ  . Drug use: No  . Sexual activity: Yes    Birth control/protection: Surgical  Other Topics Concern  . Not on file  Social History Narrative   She is married, she is a homemaker, she has 1 son who is a Proofreader, he is an Education officer, environmental in [place]   Very occasional alcohol never smoker no tobacco no drug use   Social Determinants of Corporate investment banker Strain:   . Difficulty of Paying Living Expenses:   Food Insecurity:   . Worried About Programme researcher, broadcasting/film/video in the Last Year:   . Barista in the Last Year:   Transportation Needs:   . Freight forwarder (Medical):   Marland Kitchen Lack of Transportation (Non-Medical):   Physical Activity:   . Days of Exercise per Week:   . Minutes of Exercise per Session:   Stress:     . Feeling of Stress :   Social Connections:   . Frequency of Communication with Friends and Family:   . Frequency of Social Gatherings with Friends and Family:   . Attends Religious Services:   . Active Member of Clubs or Organizations:   . Attends Banker Meetings:   Marland Kitchen Marital Status:   Intimate Partner Violence:   . Fear of Current or Ex-Partner:   . Emotionally Abused:   Marland Kitchen Physically Abused:   . Sexually Abused:      Allergies  Allergen Reactions  . Codeine Nausea And Vomiting     Immunization History  Administered Date(s) Administered  . H1N1 01/29/2008  . Influenza Inj Mdck Quad Pf 10/13/2016, 10/27/2017, 10/18/2018  . Influenza,inj,Quad PF,6+ Mos 11/05/2014  . Influenza,inj,quad, With Preservative 10/27/2017  . Influenza-Unspecified 10/11/2008, 10/15/2013, 11/05/2014, 09/30/2015, 10/11/2017, 10/26/2018  . PFIZER SARS-COV-2 Vaccination 02/19/2019, 03/13/2019  . Pneumococcal Conjugate-13 12/12/2014  . Pneumococcal Polysaccharide-23 01/12/2012  . Td 12/12/2014  . Zoster 12/08/2015  . Zoster Recombinat (Shingrix) 07/10/2017, 08/09/2017    Outpatient Medications Prior to Visit  Medication Sig Dispense Refill  . albuterol (VENTOLIN HFA) 108 (90 Base) MCG/ACT inhaler Inhale 2 puffs into the lungs every 6 (six) hours as needed for wheezing or shortness of breath. 18 g 2  . aspirin 81 MG tablet Take 81 mg by mouth daily.    Marland Kitchen azithromycin (ZITHROMAX) 250 MG tablet Take 2 tab p.o. day 1, and then take 1 tab p.o. daily for 4 more days. 6 tablet 0  . Biotin 5000 MCG CAPS Take 5,000 mcg by mouth daily with breakfast.    . Cholecalciferol (VITAMIN D-3) 25 MCG (1000 UT) CAPS Take 1,000 Units by mouth daily with breakfast.     . clobetasol (TEMOVATE) 0.05 % external solution Apply 1 application topically as needed.    . Esomeprazole Magnesium (NEXIUM 24HR) 20 MG TBEC Take 1 tablet by mouth daily.    . fluticasone furoate-vilanterol (BREO ELLIPTA) 200-25 MCG/INH AEPB  Inhale 1 puff into  the lungs daily. 1 each 5  . levothyroxine (SYNTHROID) 125 MCG tablet TAKE ONE TABLET BY MOUTH EVERY MORNING 30 tablet 0  . Methylcellulose, Laxative, (CITRUCEL) 500 MG TABS Take 1 tablet by mouth daily.    . montelukast (SINGULAIR) 10 MG tablet Take 1 tablet (10 mg total) by mouth daily before breakfast. 30 tablet 11  . Multiple Vitamins-Minerals (CENTRUM SILVER) tablet Take 1 tablet by mouth daily with breakfast.     . naproxen sodium (ALEVE) 220 MG tablet Take 220-440 mg by mouth as needed (for pain or headaches).     . rosuvastatin (CRESTOR) 20 MG tablet Take 1 tablet (20 mg total) by mouth daily. 90 tablet 3  . fluticasone furoate-vilanterol (BREO ELLIPTA) 200-25 MCG/INH AEPB Inhale 1 puff into the lungs daily. 28 each 0   No facility-administered medications prior to visit.    Review of Systems  Constitutional: Negative.   HENT: Negative for congestion.   Respiratory: Positive for cough, shortness of breath and wheezing. Negative for sputum production.   Cardiovascular: Negative for chest pain and leg swelling.  Gastrointestinal: Negative for blood in stool and melena.  Genitourinary: Negative for hematuria.  Neurological: Negative for weakness.  Endo/Heme/Allergies: Negative for environmental allergies. Does not bruise/bleed easily.     Objective:   Vitals:   07/17/19 1355  BP: 120/86  Pulse: 75  Temp: 98.3 F (36.8 C)  SpO2: 99%  Weight: 271 lb 12.8 oz (123.3 kg)  Height: 5\' 8"  (1.727 m)   99% on   RA BMI Readings from Last 3 Encounters:  07/17/19 41.33 kg/m  05/21/19 40.14 kg/m  04/17/19 39.59 kg/m   Wt Readings from Last 3 Encounters:  07/17/19 271 lb 12.8 oz (123.3 kg)  05/21/19 264 lb (119.7 kg)  04/17/19 260 lb 6.4 oz (118.1 kg)    Physical Exam Vitals reviewed.  Constitutional:      General: She is not in acute distress.    Appearance: Normal appearance. She is obese. She is not ill-appearing.  HENT:     Head: Normocephalic and  atraumatic.  Eyes:     General: No scleral icterus. Cardiovascular:     Rate and Rhythm: Normal rate and regular rhythm.     Heart sounds: No murmur heard.   Pulmonary:     Comments: Breathing comfortably on room air, no conversational dyspnea.  Clear to auscultation bilaterally. Abdominal:     General: There is no distension.  Musculoskeletal:        General: No swelling or deformity.     Cervical back: Neck supple.  Lymphadenopathy:     Cervical: No cervical adenopathy.  Skin:    General: Skin is warm and dry.     Findings: No rash.  Neurological:     General: No focal deficit present.     Mental Status: She is alert.     Coordination: Coordination normal.  Psychiatric:        Mood and Affect: Mood normal.        Behavior: Behavior normal.      CBC    Component Value Date/Time   WBC 7.3 08/24/2018 0919   WBC 11.9 (H) 08/10/2018 0829   RBC 4.57 08/24/2018 0919   RBC 4.41 08/10/2018 0829   HGB 14.6 08/24/2018 0919   HCT 44.1 08/24/2018 0919   PLT 182 08/24/2018 0919   MCV 97 08/24/2018 0919   MCH 31.9 08/24/2018 0919   MCH 32.0 08/10/2018 0829   MCHC 33.1 08/24/2018 0919  MCHC 32.9 08/10/2018 0829   RDW 12.6 08/24/2018 0919   LYMPHSABS 1.9 08/24/2018 0919   MONOABS 0.4 07/16/2013 0705   EOSABS 0.1 08/24/2018 0919   BASOSABS 0.0 08/24/2018 0919    CHEMISTRY No results for input(s): NA, K, CL, CO2, GLUCOSE, BUN, CREATININE, CALCIUM, MG, PHOS in the last 168 hours. CrCl cannot be calculated (Patient's most recent lab result is older than the maximum 21 days allowed.).   Chest Imaging- films reviewed: CXR, 2 view 03/12/2019- rounded density on lateral view anterior to carina, possibly prominent pulmonary artery, similar in appearance to CXR in 2012.  Excessive lucency in the posterior lower hemithorax.  CT chest 2013-left lower lobe nodule 1.4 x 1.0 cm, subpleural nodule in the right.  Subcentimeter left upper lobe nodule.  Well-circumscribed density in the  mediastinum just anterior to the carina.  Reports from CT scans at [facility], most recently 2018 reviewed-postoperative changes from left lower lobectomy without evidence of recurrent disease.  Scattered pulmonary nodules-LUL 3 mm stable, LUL 8 mm stable, RLL 3 mm stable.  Other scattered nodules not further described.  CT chest 03/28/2019- right upper lobe 8 x 9mm subpleural nodule in the anterior apex.  Postsurgical changes of the left lower lobectomy.  Left upper lobe nodule, minimally changed from 2013.  Right lower lobe calcified granuloma and small subpleural nodule, stable.  CT chest 06/21/2019- persistent subpleural anterior RUL opacity and lobulated LUL-with stable.  Scattered groundglass opacities bilaterally.  No significant mediastinal or hilar adenopathy.  Pulmonary Functions Testing Results: PFT Results Latest Ref Rng & Units 04/05/2019  FVC-Pre L 2.38  FVC-Predicted Pre % 63  FVC-Post L 2.55  FVC-Predicted Post % 68  Pre FEV1/FVC % % 73  Post FEV1/FCV % % 76  FEV1-Pre L 1.73  FEV1-Predicted Pre % 60  FEV1-Post L 1.92  DLCO UNC% % 110  DLCO COR %Predicted % 146  TLC L 4.23  TLC % Predicted % 74  RV % Predicted % 76    2021- mild restriction, no significant obstruction.  Incomplete bronchodilator reversibility.  P[facility] 12/20/[facility]: FVC 3.12 (84%) FEV1 2.09 (72%) Ratio 67 TLC 5.08 (90%) RV 1.96 (93%) CO 29.6 (145%) Moderate obstruction, no restriction or diffusion impairment.   Echocardiogram 08/31/2018: LVEF 55 to 60%, mild asymmetric hypertrophy with diastolic dysfunction.  Normal LA, RV, RA.  Trivial MR.  Trivial TR, valve leaflets moderately thickened.  Otherwise normal valves.  Trivial pericardial effusion.      Assessment & Plan:     ICD-10-CM   1. History of malignant carcinoid tumor of bronchus and lung  Z85.110 CT Chest Wo Contrast  2. Mild persistent asthma without complication  J45.30     Mild persistent asthma. -Start Advair  500-50 twice daily.  She should rinse her mouth after every use.  Discontinue Breo and starting Advair. -Continue albuterol every 4 hours as needed -Continue Singulair  Pulmonary nodule with history of pulmonary carcinoid. -Reviewed chest CT in the office.  Recommend 66-month follow-up CT scan to evaluate right upper lobe subpleural lesion.  RTC in 1-2 months.    Current Outpatient Medications:  .  albuterol (VENTOLIN HFA) 108 (90 Base) MCG/ACT inhaler, Inhale 2 puffs into the lungs every 6 (six) hours as needed for wheezing or shortness of breath., Disp: 18 g, Rfl: 2 .  aspirin 81 MG tablet, Take 81 mg by mouth daily., Disp: , Rfl:  .  azithromycin (ZITHROMAX) 250 MG tablet, Take 2 tab p.o. day 1, and  then take 1 tab p.o. daily for 4 more days., Disp: 6 tablet, Rfl: 0 .  Biotin 5000 MCG CAPS, Take 5,000 mcg by mouth daily with breakfast., Disp: , Rfl:  .  Cholecalciferol (VITAMIN D-3) 25 MCG (1000 UT) CAPS, Take 1,000 Units by mouth daily with breakfast. , Disp: , Rfl:  .  clobetasol (TEMOVATE) 0.05 % external solution, Apply 1 application topically as needed., Disp: , Rfl:  .  Esomeprazole Magnesium (NEXIUM 24HR) 20 MG TBEC, Take 1 tablet by mouth daily., Disp: , Rfl:  .  fluticasone furoate-vilanterol (BREO ELLIPTA) 200-25 MCG/INH AEPB, Inhale 1 puff into the lungs daily., Disp: 1 each, Rfl: 5 .  levothyroxine (SYNTHROID) 125 MCG tablet, TAKE ONE TABLET BY MOUTH EVERY MORNING, Disp: 30 tablet, Rfl: 0 .  Methylcellulose, Laxative, (CITRUCEL) 500 MG TABS, Take 1 tablet by mouth daily., Disp: , Rfl:  .  montelukast (SINGULAIR) 10 MG tablet, Take 1 tablet (10 mg total) by mouth daily before breakfast., Disp: 30 tablet, Rfl: 11 .  Multiple Vitamins-Minerals (CENTRUM SILVER) tablet, Take 1 tablet by mouth daily with breakfast. , Disp: , Rfl:  .  naproxen sodium (ALEVE) 220 MG tablet, Take 220-440 mg by mouth as needed (for pain or headaches). , Disp: , Rfl:  .  rosuvastatin (CRESTOR) 20 MG  tablet, Take 1 tablet (20 mg total) by mouth daily., Disp: 90 tablet, Rfl: 3 .  fluticasone-salmeterol (ADVAIR HFA) 230-21 MCG/ACT inhaler, Inhale 2 puffs into the lungs 2 (two) times daily., Disp: 1 Inhaler, Rfl: 11    Steffanie Dunn, DO [facility] 07/17/2019 7:18 PM

## 2019-08-13 ENCOUNTER — Other Ambulatory Visit: Payer: Self-pay | Admitting: Family Medicine

## 2019-08-13 DIAGNOSIS — Z79899 Other long term (current) drug therapy: Secondary | ICD-10-CM

## 2019-08-13 DIAGNOSIS — E039 Hypothyroidism, unspecified: Secondary | ICD-10-CM

## 2019-08-13 DIAGNOSIS — E785 Hyperlipidemia, unspecified: Secondary | ICD-10-CM

## 2019-08-13 NOTE — Telephone Encounter (Signed)
Last check up 08/17/18 - hospital follow up. Note sent with rx last month to schedule and no upcoming appt

## 2019-08-13 NOTE — Telephone Encounter (Signed)
Lipid, liver, metabolic 7, TSH, free T4 Hyperlipidemia, hypothyroidism

## 2019-08-13 NOTE — Telephone Encounter (Signed)
Last labs 08/24/18 magnessium, tsh, free t4, crp, bmp, cbc

## 2019-08-13 NOTE — Telephone Encounter (Signed)
Pt has physical scheduled 8/30 with Santiago Glad and would like lab work.

## 2019-08-13 NOTE — Telephone Encounter (Signed)
Please call pt to set up appt; then may route back to nurses. Thank you

## 2019-08-14 NOTE — Telephone Encounter (Signed)
Orders put in and pt notified.  

## 2019-08-14 NOTE — Addendum Note (Signed)
Addended by: Carmelina Noun on: 08/14/2019 10:06 AM   Modules accepted: Orders

## 2019-08-16 DIAGNOSIS — M25562 Pain in left knee: Secondary | ICD-10-CM | POA: Diagnosis not present

## 2019-08-16 DIAGNOSIS — M25561 Pain in right knee: Secondary | ICD-10-CM | POA: Diagnosis not present

## 2019-08-16 DIAGNOSIS — M170 Bilateral primary osteoarthritis of knee: Secondary | ICD-10-CM | POA: Diagnosis not present

## 2019-08-20 DIAGNOSIS — E785 Hyperlipidemia, unspecified: Secondary | ICD-10-CM | POA: Diagnosis not present

## 2019-08-20 DIAGNOSIS — E039 Hypothyroidism, unspecified: Secondary | ICD-10-CM | POA: Diagnosis not present

## 2019-08-20 DIAGNOSIS — Z79899 Other long term (current) drug therapy: Secondary | ICD-10-CM | POA: Diagnosis not present

## 2019-08-21 LAB — BASIC METABOLIC PANEL
BUN/Creatinine Ratio: 12 (ref 12–28)
BUN: 9 mg/dL (ref 8–27)
CO2: 28 mmol/L (ref 20–29)
Calcium: 9.4 mg/dL (ref 8.7–10.3)
Chloride: 100 mmol/L (ref 96–106)
Creatinine, Ser: 0.73 mg/dL (ref 0.57–1.00)
GFR calc Af Amer: 101 mL/min/{1.73_m2} (ref >59)
GFR calc non Af Amer: 88 mL/min/{1.73_m2} (ref >59)
Glucose: 84 mg/dL (ref 65–99)
Potassium: 3.8 mmol/L (ref 3.5–5.2)
Sodium: 141 mmol/L (ref 134–144)

## 2019-08-21 LAB — LIPID PANEL
Chol/HDL Ratio: 2.4 ratio (ref 0.0–4.4)
Cholesterol, Total: 157 mg/dL (ref 100–199)
HDL: 65 mg/dL (ref >39)
LDL Chol Calc (NIH): 78 mg/dL (ref 0–99)
Triglycerides: 70 mg/dL (ref 0–149)
VLDL Cholesterol Cal: 14 mg/dL (ref 5–40)

## 2019-08-21 LAB — HEPATIC FUNCTION PANEL
ALT: 22 IU/L (ref 0–32)
AST: 24 IU/L (ref 0–40)
Albumin: 4.6 g/dL (ref 3.8–4.8)
Alkaline Phosphatase: 79 IU/L (ref 48–121)
Bilirubin Total: 0.5 mg/dL (ref 0.0–1.2)
Bilirubin, Direct: 0.17 mg/dL (ref 0.00–0.40)
Total Protein: 6.7 g/dL (ref 6.0–8.5)

## 2019-08-21 LAB — TSH: TSH: 1.14 u[IU]/mL (ref 0.450–4.500)

## 2019-08-21 LAB — T4, FREE: Free T4: 1.72 ng/dL (ref 0.82–1.77)

## 2019-08-22 ENCOUNTER — Telehealth: Payer: Self-pay | Admitting: Cardiology

## 2019-08-22 NOTE — Telephone Encounter (Signed)
   Primary Cardiologist: Candee Furbish, MD  Chart reviewed and patient contacted today by phone as part of pre-operative protocol coverage. Given past medical history and time since last visit, based on ACC/AHA guidelines, JULIETTA BATTERMAN would be at acceptable risk for the planned procedure without further cardiovascular testing.   OK to hold aspirin 5-7 days pre op if needed.  I will route this recommendation to the requesting party via Epic fax function and remove from pre-op pool.  Please call with questions.  Kerin Ransom, PA-C 08/22/2019, 3:30 PM

## 2019-08-22 NOTE — Telephone Encounter (Signed)
° °  [facility] Pre-operative Risk Assessment      Request for surgical clearance:  1. What type of surgery is being performed?  Right Total knee Arthoplasty   2. When is this surgery scheduled? 09/24/2019   3. What type of clearance is required (medical clearance vs. Pharmacy clearance to hold med vs. Both)? Both  4. Are there any medications that need to be held prior to surgery and how long? ASA   5. Practice name and name of physician performing surgery? [facility]- Dr. Maureen Ralphs   6. What is the office phone number? [phone]   7.   What is the office fax number? [place]  8.   Anesthesia type (None, local, MAC, general) ? Choice  _________________________________________________________________   (provider comments below)

## 2019-08-23 DIAGNOSIS — M170 Bilateral primary osteoarthritis of knee: Secondary | ICD-10-CM | POA: Diagnosis not present

## 2019-08-23 DIAGNOSIS — M25561 Pain in right knee: Secondary | ICD-10-CM | POA: Diagnosis not present

## 2019-08-24 ENCOUNTER — Telehealth: Payer: Self-pay | Admitting: Family Medicine

## 2019-08-24 NOTE — Telephone Encounter (Signed)
Discussed with pt and she verbalized understanding. Her appt is with karen on the 30th and pt asked if you would be here that day to fill out her form and I told her you was here.

## 2019-08-24 NOTE — Telephone Encounter (Signed)
Nurses I did receive a request from [facility] for this patient to have medical clearance for her surgery  Please talk with patient Let her know that we received this form I did review over cardiology notes as well as they gave approval from their perspective We can fill this form out but will discuss further when she follows up for her wellness checkup on the 30th It is very important for the patient to make sure that she discusses with orthopedics directly what measures she needs to take or even they need to take to lessen her risk of any type of complications from the surgery including blood clots Also she should make sure she talks with the surgeon to review over any concerns or questions she has regarding the surgery  If the patient does not have any further questions of [place] we will fill out the form and send it to [facility] Please respond to me regarding this I will keep the form on my desk until I hear back

## 2019-08-27 DIAGNOSIS — H2513 Age-related nuclear cataract, bilateral: Secondary | ICD-10-CM | POA: Diagnosis not present

## 2019-08-27 DIAGNOSIS — H5203 Hypermetropia, bilateral: Secondary | ICD-10-CM | POA: Diagnosis not present

## 2019-08-28 NOTE — Telephone Encounter (Signed)
I will forward the form to the nurses station, when the patient comes in on that particular day it is fine for Santiago Glad to share the form with me to sign

## 2019-09-07 DIAGNOSIS — Z0189 Encounter for other specified special examinations: Secondary | ICD-10-CM | POA: Diagnosis not present

## 2019-09-10 ENCOUNTER — Encounter: Payer: Self-pay | Admitting: Family Medicine

## 2019-09-10 ENCOUNTER — Ambulatory Visit (INDEPENDENT_AMBULATORY_CARE_PROVIDER_SITE_OTHER): Payer: BC Managed Care – PPO | Admitting: Family Medicine

## 2019-09-10 ENCOUNTER — Other Ambulatory Visit: Payer: Self-pay

## 2019-09-10 VITALS — BP 128/82 | HR 79 | Temp 97.1°F | Ht 66.5 in | Wt 255.0 lb

## 2019-09-10 DIAGNOSIS — Z0000 Encounter for general adult medical examination without abnormal findings: Secondary | ICD-10-CM | POA: Diagnosis not present

## 2019-09-10 DIAGNOSIS — K5792 Diverticulitis of intestine, part unspecified, without perforation or abscess without bleeding: Secondary | ICD-10-CM | POA: Diagnosis not present

## 2019-09-10 MED ORDER — LEVOTHYROXINE SODIUM 125 MCG PO TABS: 125.0000 ug | ORAL_TABLET | Freq: Every morning | ORAL | 1 refills | Status: DC

## 2019-09-10 MED ORDER — CIPROFLOXACIN HCL 500 MG PO TABS: 500.0000 mg | ORAL_TABLET | Freq: Two times a day (BID) | ORAL | 0 refills | Status: DC

## 2019-09-10 MED ORDER — METRONIDAZOLE 500 MG PO TABS: 500.0000 mg | ORAL_TABLET | Freq: Three times a day (TID) | ORAL | 0 refills | Status: DC

## 2019-09-10 NOTE — Patient Instructions (Signed)

## 2019-09-10 NOTE — Progress Notes (Signed)
The patient comes in today for a wellness visit.    A review of their health history was completed.  A review of medications was also completed.  Any needed refills; refills on chronic meds; pt would like 90 days   Eating habits: doing better this past month  Falls/  MVA accidents in past few months: none  Regular exercise: walking; knees are bad so pt not able to exercise a lot   Specialist pt sees on regular basis: ortho and pulmonology, GI   Preventative health issues were discussed.     Patient ID: Debbie Miranda, female    DOB: August 16, 1955, 64 y.o.   MRN: [id number]   Chief Complaint  Patient presents with  . Annual Exam   Subjective:    HPI  Wellness exam.  Additional concerns: having some diverticular pain.pt would like to know if it is ok to take antibiotics before surgery. Also would like to have diverticular drugs on hand.  Pt is having knee surgery on Monday. Dr.Scott has not signed off on surgery clearance yet; going to be on Xarelto and every precaution is going to be taken. Trouble falling back asleep once waking up at night.     Medical History Alexsandra has a past medical history of Acute ischemic colitis ([place]) (08/09/2018), Anal itching (10/27/2011), Arthritis, Cancer ([place]), Carcinoid tumor of lung (2013), Cataract, Contact lens/glasses fitting, Corn of toe 5th right (08/16/2012), Costochondritis, Diverticulitis (2012), Dysplastic nevi, Esophageal reflux, External hemorrhoids without mention of complication (5573), GERD with stricture (05/16/2012), Headache(784.0), HTN (hypertension) (08/09/2018), Hyperlipidemia, Hypertension, Hypothyroidism, IFG (impaired fasting glucose), Impaired fasting glucose (05/16/2012), Mycotic toenails (12/10/2010), Neuropathic pain (06/30/2016), Obesity, Personal history of colonic polyps (07/30/2015), PONV (postoperative nausea and vomiting), Positive H. pylori test, Thyroid nodule (05/16/2012), Urge incontinence (07/05/2018), and Venous insufficiency  of left leg (03/29/2016).   Outpatient Encounter Medications as of 09/10/2019  Medication Sig  . aspirin 81 MG tablet Take 81 mg by mouth daily.  . Biotin 5000 MCG CAPS Take 5,000 mcg by mouth daily with breakfast.  . Cholecalciferol (VITAMIN D-3) 25 MCG (1000 UT) CAPS Take 1,000 Units by mouth daily with breakfast.   . Esomeprazole Magnesium (NEXIUM 24HR) 20 MG TBEC Take 20 mg by mouth daily.   . fluticasone-salmeterol (ADVAIR HFA) 230-21 MCG/ACT inhaler Inhale 2 puffs into the lungs 2 (two) times daily.  Marland Kitchen levothyroxine (SYNTHROID) 125 MCG tablet Take 1 tablet (125 mcg total) by mouth every morning.  . Methylcellulose, Laxative, (CITRUCEL) 500 MG TABS Take 500 mg by mouth daily.   . montelukast (SINGULAIR) 10 MG tablet Take 1 tablet (10 mg total) by mouth daily before breakfast.  . Multiple Vitamins-Minerals (CENTRUM SILVER) tablet Take 1 tablet by mouth daily with breakfast.   . naproxen sodium (ALEVE) 220 MG tablet Take 220-440 mg by mouth daily as needed (for pain or headaches).   . Polyethylene Glycol 400 (BLINK TEARS) 0.25 % SOLN Place 1 drop into both eyes daily as needed (Dry eyes).  . rosuvastatin (CRESTOR) 20 MG tablet Take 1 tablet (20 mg total) by mouth daily.  . [DISCONTINUED] levothyroxine (SYNTHROID) 125 MCG tablet TAKE ONE TABLET BY MOUTH EVERY MORNING (Patient taking differently: Take 125 mcg by mouth daily before breakfast. )  . ciprofloxacin (CIPRO) 500 MG tablet Take 1 tablet (500 mg total) by mouth 2 (two) times daily.  . metroNIDAZOLE (FLAGYL) 500 MG tablet Take 1 tablet (500 mg total) by mouth 3 (three) times daily.  . [DISCONTINUED] albuterol (VENTOLIN HFA) 108 (90  Base) MCG/ACT inhaler Inhale 2 puffs into the lungs every 6 (six) hours as needed for wheezing or shortness of breath.  . [DISCONTINUED] fluticasone furoate-vilanterol (BREO ELLIPTA) 200-25 MCG/INH AEPB Inhale 1 puff into the lungs daily. (Patient not taking: Reported on 09/05/2019)   No facility-administered  encounter medications on file as of 09/10/2019.     Review of Systems  Constitutional: Negative for chills and fever.  Respiratory: Negative.   Cardiovascular: Negative.   Gastrointestinal: Positive for abdominal pain.       Reports diverticular pain  Musculoskeletal:       Right knee surgery pending   Skin: Negative.      Vitals BP 128/82   Pulse 79   Temp (!) 97.1 F (36.2 C)   Ht 5' 6.5" (1.689 m)   Wt 255 lb (115.7 kg)   SpO2 99%   BMI 40.54 kg/m   Objective:   Physical Exam Vitals and nursing note reviewed. Exam conducted with a chaperone present (reports tenderness under right breast that has been present for years- no mass palpated.).  Constitutional:      Appearance: Normal appearance.  Cardiovascular:     Rate and Rhythm: Normal rate and regular rhythm.     Heart sounds: Normal heart sounds.  Pulmonary:     Effort: Pulmonary effort is normal.     Breath sounds: Normal breath sounds.  Chest:     Chest wall: Tenderness present. No mass or swelling.     Breasts:        Right: Normal. No mass or nipple discharge.        Left: Normal. No mass or nipple discharge.  Abdominal:     General: Bowel sounds are normal.     Tenderness: There is abdominal tenderness.     Comments: Left upper quadrant and left lower quadrant tenderness upon palpation.   Genitourinary:    General: Normal vulva.     Exam position: Lithotomy position.     Vagina: No vaginal discharge.     Comments: External GU: no rashes or lesions. Vagina: no discharge; tissue pink in color.  Bimanual exam: no tenderness or obvious masses.  Skin:    General: Skin is warm and dry.  Neurological:     Mental Status: She is alert and oriented to person, place, and time.  Psychiatric:        Mood and Affect: Mood normal.        Behavior: Behavior normal.      Assessment and Plan   1. Diverticulitis - ciprofloxacin (CIPRO) 500 MG tablet; Take 1 tablet (500 mg total) by mouth 2 (two) times daily.   Dispense: 20 tablet; Refill: 0 - metroNIDAZOLE (FLAGYL) 500 MG tablet; Take 1 tablet (500 mg total) by mouth 3 (three) times daily.  Dispense: 21 tablet; Refill: 0  2. Wellness examination - levothyroxine (SYNTHROID) 125 MCG tablet; Take 1 tablet (125 mcg total) by mouth every morning.  Dispense: 90 tablet; Refill: 1   Skylen presents today for her wellness exam and to be cleared for her upcoming surgery on Monday. She reports that she is having diverticular pain and restarted some leftover antibiotics two days ago. I reordered Cipro and Flaygl for her to continue this treatment.   Discussed risk of having joint surgery with possible infection in intestines.   Her yearly labs have been reviewed by Dr. Wolfgang Phoenix and look good. She will get medication refills today.  She is disappointed that Dr. Wolfgang Phoenix doesn't want to clear  her for surgery today due to her diverticular pain. She will notify Dr. Juanda Bond of this development.  She reports her asthma is well-controlled on the regimen that pulm doctor started for her.   She will follow-up in 3 weeks to assess full resolution of diverticular pain. Clearance for surgery will be determined at that time.  Agrees with plan of care discussed today. Understands warning signs to seek further care: fever, increased abdominal pain, chills.  Understands to follow-up if symptoms do not improve or if anything changes.    Chalmers Guest, NP 09/10/2019

## 2019-09-11 ENCOUNTER — Telehealth: Payer: Self-pay | Admitting: Family Medicine

## 2019-09-11 ENCOUNTER — Encounter: Payer: Self-pay | Admitting: Family Medicine

## 2019-09-11 NOTE — Telephone Encounter (Signed)
I did do a letter to [facility] [employer] regarding this patient's upcoming surgery please fax it to him

## 2019-09-12 ENCOUNTER — Encounter (HOSPITAL_COMMUNITY): Payer: BC Managed Care – PPO

## 2019-09-13 NOTE — Telephone Encounter (Signed)
Letter faxed over today

## 2019-09-19 ENCOUNTER — Ambulatory Visit: Payer: BC Managed Care – PPO | Admitting: Critical Care Medicine

## 2019-09-24 ENCOUNTER — Ambulatory Visit: Admit: 2019-09-24 | Payer: BC Managed Care – PPO | Admitting: Orthopedic Surgery

## 2019-09-24 SURGERY — ARTHROPLASTY, KNEE, TOTAL
Anesthesia: Choice | Site: Knee | Laterality: Right

## 2019-09-28 ENCOUNTER — Other Ambulatory Visit: Payer: Self-pay

## 2019-09-28 ENCOUNTER — Ambulatory Visit: Payer: BC Managed Care – PPO | Admitting: Critical Care Medicine

## 2019-09-28 ENCOUNTER — Encounter: Payer: Self-pay | Admitting: Critical Care Medicine

## 2019-09-28 DIAGNOSIS — R911 Solitary pulmonary nodule: Secondary | ICD-10-CM | POA: Diagnosis not present

## 2019-09-28 DIAGNOSIS — J4530 Mild persistent asthma, uncomplicated: Secondary | ICD-10-CM

## 2019-09-28 NOTE — Progress Notes (Signed)
Synopsis: Referred in March 2021 for dyspnea on exertion by Kathyrn Drown, MD.   Subjective:   PATIENT ID: Debbie Miranda GENDER: female DOB: 12-02-1955, MRN: [id number]  Chief Complaint  Patient presents with  . Follow-up    Patient is feeling good since last visit, no concerns at this time. Would like to know what type of asthma she has and states that stress, exercise makes it worse    Debbie Miranda is a 64 y/o woman with a history of pulmonary carcinoid, status post lobectomy who presents for follow-up of moderate persistent asthma.  She is on high-dose Advair and montelukast with well-controlled symptoms.  Overall her breathing is doing better and she is able to walk around without stopping.  She has been less active recently due to knee osteoarthritis.  She was scheduled to have knee replacement done recently, which was delayed due to a mild case of diverticulitis.  Overall her symptoms are well controlled other than when she is more emotional, which causes her symptoms to worsen.  She has no wheezing or significant coughing.      OV 07/17/19: Debbie Miranda is a 64 year old woman with a history of pulmonary carcinoid and mild persistent asthma who presents for follow-up.  She has noticed improvement in her symptoms since starting Breo, but still has dyspnea on exertion.  Her symptoms do not significantly improve when using albuterol.  She had a respiratory infection about a month ago which has improved.  She had this around the time she used after CT scan a few weeks ago.  She now only has minimal clear sputum production.  Her main complaint is that she still feels as though she cannot take a full breath.  She has no previous history of allergies and is not sure why she has asthma now.   OV 04/17/19: Debbie Miranda is a 64 year old woman who presents for follow-up of shortness of breath.  Since she started on Breo her symptoms have been significantly improved.  But she is noticing she is  struggling a little bit more today.  She has been outside recently, and has not noticed a difference in her symptoms if she is outside or inside.  She does not think that the pollen in the air contributes to her symptoms.  She has not been needing her albuterol since she was last seen due to how much better she is been feeling.  She is been wheezing a few times since she was last seen.  Her shortness of breath is significantly improved.  She has had some minor coughing, but no sputum production.  No nocturnal symptoms.  No previous known history of asthma.   OV 03/23/19: Debbie Miranda is a 64 year old woman who presents for evaluation of shortness of breath.  She suddenly developed shortness of breath overnight about 10 months ago that has worsened over time, especially over the past 3 months.  It was initially dyspnea with exertion, but has now progressed to include shortness of breath at rest.  When this started she was able to walk about 45 minutes on the treadmill without stopping.  She can only walk about 15 minutes without stopping now.  If she is outside and there is any incline at all she has to stop.  She can no longer walk up the stairs without stopping.  She has wheezing at times and occasional dry cough.  No nighttime symptoms.  No significant bruising, bleeding or history of anemia.  She takes baby aspirin  daily, but no blood thinners.  She recently had blood work with her PCP that she reports was normal.  No chest pain or personal history of DVTs.  Her mother had a pulmonary embolus.  She occasionally has feelings of tachycardia and leg edema.  No significant improvement or worsening with position changes.  Nothing has improved her symptoms, and activity is the only exacerbating factor. She has no history of allergies or asthma.  She has no significant inhalational exposures.  She has never tried inhalers.  Was evaluated by cardiology in the fall and was felt to have dyspnea due to her obesity.   She has been this weight before without having significant symptoms.  She has been working on losing weight, which has been difficult.  Repeat echocardiogram and coronary CT scans have been ordered.   She has a history of a left lower lobe carcinoid that was incidentally detected and underwent left lower lobectomy in December 2013 at [facility].  No previous radiation or chemotherapy required.  She followed with Dr. Patsy [place] oncology-most recent note from 12/13/2016 reviewed.  Most recent CT scan in 2018 did not show recurrence.    Past Medical History:  Diagnosis Date  . Acute ischemic colitis ([place]) 08/09/2018  . Anal itching 10/27/2011  . Arthritis   . Cancer ([place])    lung-7years ago  . 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 Cataract    beginning stages,bilateral  . Contact lens/glasses fitting    wares contacts or glasses  . Corn of toe 5th right 08/16/2012  . Costochondritis   . Diverticulitis 2012  . Dysplastic nevi   . Esophageal reflux   . External hemorrhoids without mention of complication 4854  . GERD with stricture 05/16/2012  . Headache(784.0)   . HTN (hypertension) 08/09/2018  . Hyperlipidemia   . Hypertension    pt denies  . Hypothyroidism   . IFG (impaired fasting glucose)   . Impaired fasting glucose 05/16/2012  . Mycotic toenails 12/10/2010   1st toenail right-lasered nail 12/10/2010-present (last on 06/15/2012)  . Neuropathic pain 06/30/2016  . Obesity   . Personal history of colonic polyps 07/30/2015   SSA  . PONV (postoperative nausea and vomiting)   . Positive H. pylori test   . Thyroid nodule 05/16/2012   Benign, 2013, [place]   . Urge incontinence 07/05/2018  . Venous insufficiency of left leg 03/29/2016     Family History  Problem Relation Age of Onset  . Kidney cancer Father   . Pulmonary embolism Mother   . Breast cancer Maternal Grandmother   . Cancer Maternal Grandmother        breast  . Heart disease Maternal Grandfather   . Colon  cancer Neg Hx   . Colon polyps Neg Hx   . Esophageal cancer Neg Hx   . Rectal cancer Neg Hx   . Stomach cancer Neg Hx      Past Surgical History:  Procedure Laterality Date  . ABDOMINAL HYSTERECTOMY    . APPENDECTOMY    . COLONOSCOPY    . ESOPHAGOGASTRODUODENOSCOPY    . EXCISION HAGLUND'S DEFORMITY WITH ACHILLES TENDON REPAIR Right 06/07/2013   Procedure: RIGHT ACHILLES DEBRIDEMENT; OEVOJJK EXCISION;  Surgeon: Wylene Simmer, MD;  Location: [place];  Service: Orthopedics;  Laterality: Right;  . EXCISION OF SKIN TAG  11/18/2011   Procedure: EXCISION OF SKIN TAG;  Surgeon: Leighton Ruff, MD;  Location: [facility];  Service: General;  Laterality: N/A;  posterior  . GASTROCNEMIUS RECESSION Right 06/07/2013   Procedure: GASTROCNEMIUS RECESSION;  Surgeon: Wylene Simmer, MD;  Location: [place];  Service: Orthopedics;  Laterality: Right;  . HEMORRHOID BANDING    . HEMORRHOID SURGERY  11/18/2011   Procedure: HEMORRHOIDECTOMY;  Surgeon: Leighton Ruff, MD;  Location: [facility];  Service: General;;  . LEFT OOPHORECTOMY    . LUNG LOBECTOMY  12/2011   [place] removed for carcinoid  . PLANTAR FASCIA SURGERY    . rectal fissure repair    . THYROID SURGERY     nodule and partial thyroidectomy  . TONSILLECTOMY      Social History   Socioeconomic History  . Marital status: Married    Spouse name: Not on file  . Number of children: 1  . Years of education: Not on file  . Highest education level: Not on file  Occupational History  . Occupation: homemaker  Tobacco Use  . Smoking status: Never Smoker  . Smokeless tobacco: Never Used  Vaping Use  . Vaping Use: Never used  Substance and Sexual Activity  . Alcohol use: Yes    Alcohol/week: 0.0 standard drinks    Comment: wine-occ  . Drug use: No  . Sexual activity: Yes    Birth control/protection: Surgical  Other Topics Concern  . Not on file  Social History  Narrative   She is married, she is a homemaker, she has 1 son who is a Sport and exercise psychologist, he is an Engineer, mining in [place]   Very occasional alcohol never smoker no tobacco no drug use   Social Determinants of Radio broadcast assistant Strain:   . Difficulty of Paying Living Expenses: Not on file  Food Insecurity:   . Worried About Charity fundraiser in the Last Year: Not on file  . Ran Out of Food in the Last Year: Not on file  Transportation Needs:   . Lack of Transportation (Medical): Not on file  . Lack of Transportation (Non-Medical): Not on file  Physical Activity:   . Days of Exercise per Week: Not on file  . Minutes of Exercise per Session: Not on file  Stress:   . Feeling of Stress : Not on file  Social Connections:   . Frequency of Communication with Friends and Family: Not on file  . Frequency of Social Gatherings with Friends and Family: Not on file  . Attends Religious Services: Not on file  . Active Member of Clubs or Organizations: Not on file  . Attends Archivist Meetings: Not on file  . Marital Status: Not on file  Intimate Partner Violence:   . Fear of Current or Ex-Partner: Not on file  . Emotionally Abused: Not on file  . Physically Abused: Not on file  . Sexually Abused: Not on file     Allergies  Allergen Reactions  . Codeine Nausea And Vomiting     Immunization History  Administered Date(s) Administered  . H1N1 01/29/2008  . Influenza Inj Mdck Quad Pf 10/13/2016, 10/27/2017, 10/18/2018  . Influenza,inj,Quad PF,6+ Mos 11/05/2014  . Influenza,inj,quad, With Preservative 10/27/2017  . Influenza-Unspecified 10/11/2008, 10/15/2013, 11/05/2014, 09/30/2015, 10/11/2017, 10/26/2018  . PFIZER SARS-COV-2 Vaccination 02/19/2019, 03/13/2019  . Pneumococcal Conjugate-13 12/12/2014  . Pneumococcal Polysaccharide-23 01/12/2012  . Td 12/12/2014  . Zoster 12/08/2015  . Zoster Recombinat (Shingrix) 07/10/2017, 08/09/2017     Outpatient Medications Prior to Visit  Medication Sig Dispense Refill  . aspirin  81 MG tablet Take 81 mg by mouth daily.    . Biotin 5000 MCG CAPS Take 5,000 mcg by mouth daily with breakfast.    . Cholecalciferol (VITAMIN D-3) 25 MCG (1000 UT) CAPS Take 1,000 Units by mouth daily with breakfast.     . Esomeprazole Magnesium (NEXIUM 24HR) 20 MG TBEC Take 20 mg by mouth daily.     . fluticasone-salmeterol (ADVAIR HFA) 230-21 MCG/ACT inhaler Inhale 2 puffs into the lungs 2 (two) times daily. 1 Inhaler 11  . levothyroxine (SYNTHROID) 125 MCG tablet Take 1 tablet (125 mcg total) by mouth every morning. 90 tablet 1  . Methylcellulose, Laxative, (CITRUCEL) 500 MG TABS Take 500 mg by mouth daily.     . montelukast (SINGULAIR) 10 MG tablet Take 1 tablet (10 mg total) by mouth daily before breakfast. 30 tablet 11  . Multiple Vitamins-Minerals (CENTRUM SILVER) tablet Take 1 tablet by mouth daily with breakfast.     . naproxen sodium (ALEVE) 220 MG tablet Take 220-440 mg by mouth daily as needed (for pain or headaches).     . Polyethylene Glycol 400 (BLINK TEARS) 0.25 % SOLN Place 1 drop into both eyes daily as needed (Dry eyes).    . rosuvastatin (CRESTOR) 20 MG tablet Take 1 tablet (20 mg total) by mouth daily. 90 tablet 3  . ciprofloxacin (CIPRO) 500 MG tablet Take 1 tablet (500 mg total) by mouth 2 (two) times daily. 20 tablet 0  . metroNIDAZOLE (FLAGYL) 500 MG tablet Take 1 tablet (500 mg total) by mouth 3 (three) times daily. 21 tablet 0   No facility-administered medications prior to visit.    Review of Systems  Constitutional: Negative.   HENT: Negative for congestion.   Respiratory: Positive for cough, shortness of breath and wheezing. Negative for sputum production.   Cardiovascular: Negative for chest pain and leg swelling.  Gastrointestinal: Negative for blood in stool and melena.  Genitourinary: Negative for hematuria.  Neurological: Negative for weakness.  Endo/Heme/Allergies:  Negative for environmental allergies. Does not bruise/bleed easily.     Objective:   There were no vitals filed for this visit.   on   RA BMI Readings from Last 3 Encounters:  09/10/19 40.54 kg/m  07/17/19 41.33 kg/m  05/21/19 40.14 kg/m   Wt Readings from Last 3 Encounters:  09/10/19 255 lb (115.7 kg)  07/17/19 271 lb 12.8 oz (123.3 kg)  05/21/19 264 lb (119.7 kg)    Physical Exam Vitals reviewed.  Constitutional:      Appearance: Normal appearance. She is obese. She is not ill-appearing.  HENT:     Head: Normocephalic and atraumatic.  Eyes:     General: No scleral icterus. Cardiovascular:     Rate and Rhythm: Normal rate and regular rhythm.     Heart sounds: No murmur heard.   Pulmonary:     Comments: Breathing comfortably on room air, no conversational dyspnea.  Clear to auscultation bilaterally.  No observed coughing. Abdominal:     General: There is no distension.     Palpations: Abdomen is soft.     Tenderness: There is no abdominal tenderness.  Musculoskeletal:        General: No swelling or deformity.     Cervical back: Neck supple.  Lymphadenopathy:     Cervical: No cervical adenopathy.  Skin:    General: Skin is warm and dry.     Findings: No rash.  Neurological:     General: No focal deficit present.  Mental Status: She is alert.     Coordination: Coordination normal.  Psychiatric:        Mood and Affect: Mood normal.        Behavior: Behavior normal.      CBC    Component Value Date/Time   WBC 7.3 08/24/2018 0919   WBC 11.9 (H) 08/10/2018 0829   RBC 4.57 08/24/2018 0919   RBC 4.41 08/10/2018 0829   HGB 14.6 08/24/2018 0919   HCT 44.1 08/24/2018 0919   PLT 182 08/24/2018 0919   MCV 97 08/24/2018 0919   MCH 31.9 08/24/2018 0919   MCH 32.0 08/10/2018 0829   MCHC 33.1 08/24/2018 0919   MCHC 32.9 08/10/2018 0829   RDW 12.6 08/24/2018 0919   LYMPHSABS 1.9 08/24/2018 0919   MONOABS 0.4 07/16/2013 0705   EOSABS 0.1 08/24/2018 0919    BASOSABS 0.0 08/24/2018 0919    CHEMISTRY No results for input(s): NA, K, CL, CO2, GLUCOSE, BUN, CREATININE, CALCIUM, MG, PHOS in the last 168 hours. CrCl cannot be calculated (Patient's most recent lab result is older than the maximum 21 days allowed.).   Chest Imaging- films reviewed: CXR, 2 view 03/12/2019- rounded density on lateral view anterior to carina, possibly prominent pulmonary artery, similar in appearance to CXR in 2012.  Excessive lucency in the posterior lower hemithorax.  CT chest 2013-left lower lobe nodule 1.4 x 1.0 cm, subpleural nodule in the right.  Subcentimeter left upper lobe nodule.  Well-circumscribed density in the mediastinum just anterior to the carina.  Reports from CT scans at [facility], most recently 2018 reviewed-postoperative changes from left lower lobectomy without evidence of recurrent disease.  Scattered pulmonary nodules-LUL 3 mm stable, LUL 8 mm stable, RLL 3 mm stable.  Other scattered nodules not further described.  CT chest 03/28/2019- right upper lobe 8 x 21mm subpleural nodule in the anterior apex.  Postsurgical changes of the left lower lobectomy.  Left upper lobe nodule, minimally changed from 2013.  Right lower lobe calcified granuloma and small subpleural nodule, stable.  CT chest 06/21/2019- persistent subpleural anterior RUL opacity and lobulated LUL-with stable.  Scattered groundglass opacities bilaterally.  No significant mediastinal or hilar adenopathy.  Pulmonary Functions Testing Results: PFT Results Latest Ref Rng & Units 04/05/2019  FVC-Pre L 2.38  FVC-Predicted Pre % 63  FVC-Post L 2.55  FVC-Predicted Post % 68  Pre FEV1/FVC % % 73  Post FEV1/FCV % % 76  FEV1-Pre L 1.73  FEV1-Predicted Pre % 60  FEV1-Post L 1.92  DLCO uncorrected ml/min/mmHg 25.19  DLCO UNC% % 110  DLCO corrected ml/min/mmHg 25.19  DLCO COR %Predicted % 110  DLVA Predicted % 146  TLC L 4.23  TLC % Predicted % 74  RV % Predicted % 76    2021- mild  restriction, no significant obstruction.  Incomplete bronchodilator reversibility.  PFT 12/20/2011 at [facility]: FVC 3.12 (84%) FEV1 2.09 (72%) Ratio 67 TLC 5.08 (90%) RV 1.96 (93%) CO 29.6 (145%) Moderate obstruction, no restriction or diffusion impairment.   Echocardiogram 08/31/2018: LVEF 55 to 60%, mild asymmetric hypertrophy with diastolic dysfunction.  Normal LA, RV, RA.  Trivial MR.  Trivial TR, valve leaflets moderately thickened.  Otherwise normal valves.  Trivial pericardial effusion.      Assessment & Plan:     ICD-10-CM   1. Mild persistent asthma without complication  R74.08   2. Pulmonary nodule  R91.1     Mild persistent asthma -Continue Advair 500-50 twice daily.  Consider de-escalating if  symptoms controlled at her next visit. -Continue albuterol every 4 hours as needed -Continue Singulair daily. -Surgical risk stratification/clearance form sent back to her orthopedist previously.  She should use her inhalers the morning of her surgery, and they should be continued while she is admitted to the hospital.  Recommend early mobility and DVT prophylaxis postoperatively.  Chronic pulmonary restriction due to previous lobectomy -No additional follow-up needed  Pulmonary nodule with history of pulmonary carcinoid. -Reviewed chest CT in the office.  Recommend 92-month follow-up CT scan (2022) to evaluate right upper lobe subpleural lesion.  RTC in 3 months with Dr Erin Fulling.    Current Outpatient Medications:  .  aspirin 81 MG tablet, Take 81 mg by mouth daily., Disp: , Rfl:  .  Biotin 5000 MCG CAPS, Take 5,000 mcg by mouth daily with breakfast., Disp: , Rfl:  .  Cholecalciferol (VITAMIN D-3) 25 MCG (1000 UT) CAPS, Take 1,000 Units by mouth daily with breakfast. , Disp: , Rfl:  .  Esomeprazole Magnesium (NEXIUM 24HR) 20 MG TBEC, Take 20 mg by mouth daily. , Disp: , Rfl:  .  fluticasone-salmeterol (ADVAIR HFA) 230-21 MCG/ACT inhaler, Inhale 2 puffs into the lungs 2  (two) times daily., Disp: 1 Inhaler, Rfl: 11 .  levothyroxine (SYNTHROID) 125 MCG tablet, Take 1 tablet (125 mcg total) by mouth every morning., Disp: 90 tablet, Rfl: 1 .  Methylcellulose, Laxative, (CITRUCEL) 500 MG TABS, Take 500 mg by mouth daily. , Disp: , Rfl:  .  montelukast (SINGULAIR) 10 MG tablet, Take 1 tablet (10 mg total) by mouth daily before breakfast., Disp: 30 tablet, Rfl: 11 .  Multiple Vitamins-Minerals (CENTRUM SILVER) tablet, Take 1 tablet by mouth daily with breakfast. , Disp: , Rfl:  .  naproxen sodium (ALEVE) 220 MG tablet, Take 220-440 mg by mouth daily as needed (for pain or headaches). , Disp: , Rfl:  .  Polyethylene Glycol 400 (BLINK TEARS) 0.25 % SOLN, Place 1 drop into both eyes daily as needed (Dry eyes)., Disp: , Rfl:  .  rosuvastatin (CRESTOR) 20 MG tablet, Take 1 tablet (20 mg total) by mouth daily., Disp: 90 tablet, Rfl: 3    Julian Hy, DO [facility] 09/28/2019 2:48 PM

## 2019-09-28 NOTE — Patient Instructions (Addendum)
Thank you for visiting Dr. Carlis Abbott at [facility]. We recommend the following:  Stay on Advair and your Singulair.   Return in about 3 months (around 12/28/2019). with Dr. Erin Fulling (30 minute visit).    Please do your part to reduce the spread of COVID-19.

## 2019-10-02 ENCOUNTER — Ambulatory Visit (INDEPENDENT_AMBULATORY_CARE_PROVIDER_SITE_OTHER): Payer: BC Managed Care – PPO | Admitting: Family Medicine

## 2019-10-02 ENCOUNTER — Encounter: Payer: Self-pay | Admitting: Family Medicine

## 2019-10-02 ENCOUNTER — Other Ambulatory Visit: Payer: Self-pay

## 2019-10-02 ENCOUNTER — Telehealth: Payer: Self-pay | Admitting: Family Medicine

## 2019-10-02 VITALS — BP 130/78 | Temp 97.0°F | Ht 66.5 in | Wt 251.4 lb

## 2019-10-02 DIAGNOSIS — K5792 Diverticulitis of intestine, part unspecified, without perforation or abscess without bleeding: Secondary | ICD-10-CM

## 2019-10-02 DIAGNOSIS — E039 Hypothyroidism, unspecified: Secondary | ICD-10-CM

## 2019-10-02 DIAGNOSIS — E785 Hyperlipidemia, unspecified: Secondary | ICD-10-CM

## 2019-10-02 MED ORDER — ONDANSETRON HCL 8 MG PO TABS: 8.0000 mg | ORAL_TABLET | Freq: Three times a day (TID) | ORAL | 0 refills | Status: DC | PRN

## 2019-10-02 MED ORDER — CYCLOBENZAPRINE HCL 10 MG PO TABS: 10.0000 mg | ORAL_TABLET | Freq: Three times a day (TID) | ORAL | 0 refills | Status: DC | PRN

## 2019-10-02 MED ORDER — CIPROFLOXACIN HCL 500 MG PO TABS: 500.0000 mg | ORAL_TABLET | Freq: Two times a day (BID) | ORAL | 0 refills | Status: DC

## 2019-10-02 MED ORDER — METRONIDAZOLE 500 MG PO TABS: 500.0000 mg | ORAL_TABLET | Freq: Three times a day (TID) | ORAL | 0 refills | Status: DC

## 2019-10-02 NOTE — Progress Notes (Signed)
   Subjective:    Patient ID: Debbie Miranda, female    DOB: 1955/05/14, 64 y.o.   MRN: [id number]  HPI  Patient arrives for a follow up on diverticulitis. Patient states she is doing much better with no further problems. Patient doing much better She would like to have a prescription for antibiotics to have on hand because sometimes this diverticulitis will hit her on the weekend and she would want to start medication when she first notices the symptoms.  We did talk about the pros and cons regarding this and prescription was given.  Also patient does take her cholesterol medicine to keep her cholesterol under control Takes her thyroid medicine on a regular basis Does not need lab work currently Will be having upcoming knee surgery. Review of Systems  Constitutional: Negative for activity change, appetite change and fatigue.  HENT: Negative for congestion and rhinorrhea.   Respiratory: Negative for cough and shortness of breath.   Cardiovascular: Negative for chest pain and leg swelling.  Gastrointestinal: Negative for abdominal pain and diarrhea.  Endocrine: Negative for polydipsia and polyphagia.  Skin: Negative for color change.  Neurological: Negative for dizziness and weakness.  Psychiatric/Behavioral: Negative for behavioral problems and confusion.       Objective:   Physical Exam Vitals reviewed.  Constitutional:      Appearance: She is well-developed.  HENT:     Head: Normocephalic.  Cardiovascular:     Rate and Rhythm: Normal rate and regular rhythm.     Heart sounds: Normal heart sounds. No murmur heard.   Pulmonary:     Effort: Pulmonary effort is normal.     Breath sounds: Normal breath sounds.  Skin:    General: Skin is warm and dry.  Neurological:     Mental Status: She is alert.            Assessment & Plan:  Osteoarthritis of the knee-she is approved for surgery.  She will follow the instructions on the orthopedist regarding this surgery. We will  send a letter to her surgeon  History diverticulitis prescriptions given if she does have to use these she needs to let us know ASAP with taking them.  Continue thyroid medicine check labs in the spring Continue cholesterol medicine check labs in the spring

## 2019-10-02 NOTE — Telephone Encounter (Signed)
Please send a copy of today's dictation as well as a copy of the letter I dictated to Dr.Alusio giving clearance for her surgery.  Please also send the patient a notification via MyChart that this was completed thank you

## 2019-10-10 DIAGNOSIS — M1711 Unilateral primary osteoarthritis, right knee: Secondary | ICD-10-CM | POA: Diagnosis not present

## 2019-10-12 DIAGNOSIS — M1711 Unilateral primary osteoarthritis, right knee: Secondary | ICD-10-CM | POA: Diagnosis not present

## 2019-10-15 DIAGNOSIS — M1711 Unilateral primary osteoarthritis, right knee: Secondary | ICD-10-CM | POA: Diagnosis not present

## 2019-10-17 DIAGNOSIS — M1711 Unilateral primary osteoarthritis, right knee: Secondary | ICD-10-CM | POA: Diagnosis not present

## 2019-10-19 DIAGNOSIS — M1711 Unilateral primary osteoarthritis, right knee: Secondary | ICD-10-CM | POA: Diagnosis not present

## 2019-10-22 DIAGNOSIS — M1711 Unilateral primary osteoarthritis, right knee: Secondary | ICD-10-CM | POA: Diagnosis not present

## 2019-10-24 DIAGNOSIS — M1711 Unilateral primary osteoarthritis, right knee: Secondary | ICD-10-CM | POA: Diagnosis not present

## 2019-10-26 DIAGNOSIS — M1711 Unilateral primary osteoarthritis, right knee: Secondary | ICD-10-CM | POA: Diagnosis not present

## 2019-10-29 DIAGNOSIS — M1711 Unilateral primary osteoarthritis, right knee: Secondary | ICD-10-CM | POA: Diagnosis not present

## 2019-10-30 ENCOUNTER — Telehealth: Payer: Self-pay | Admitting: Critical Care Medicine

## 2019-10-30 NOTE — Telephone Encounter (Signed)
PA request received for pt's Advair HFA 230-24mcg.  Before initiating the PA, the below information immediately showed on screen:  Debbie Miranda  Non-preferred medications may have a higher patient co-pay than the health insurance plan's preferred medications. Using available formulary data, we believe that the following medications may be covered. Drug Name PA Requirement Symbicort Tier 2, QL NOT Required* Breo Ellipta Tier 2, QL NOT Required* Flovent HFA Tier 2, QL NOT Required* Dulera Tier 2, QL NOT Required* Anoro Ellipta Tier 2, QL NOT Required* Trelegy Ellipta Tier 2 NOT Required* Breztri Aerosphere Tier 2, QL NOT Required* Serevent Diskus Tier 2, QL NOT Required* AirDuo Digihaler Tier 3 NOT Required*  Dr. Carlis Abbott, please advise on this if you still want [place] to try to initiate the PA as the only inhaler that is preferred/covered that pt has tried out of this list is Debbie Miranda.

## 2019-10-31 DIAGNOSIS — M1711 Unilateral primary osteoarthritis, right knee: Secondary | ICD-10-CM | POA: Diagnosis not present

## 2019-10-31 MED ORDER — DULERA 200-5 MCG/ACT IN AERO: 2.0000 | INHALATION_SPRAY | Freq: Two times a day (BID) | RESPIRATORY_TRACT | 5 refills | Status: DC

## 2019-10-31 NOTE — Telephone Encounter (Signed)
Dulera 200, Symbicort 160, and Breo 200 are all reasonable alternatives to try before the PA. Thanks!  LPC

## 2019-10-31 NOTE — Telephone Encounter (Signed)
Called and spoke with pt in regards to the PA for Advair and also of the preferred alternatives. Stated to her that PC wanted to switch her inhaler due to others being preferred and she verbalized understanding. Rx for Dulera 200 has been sent to pharmacy for pt. Also have scheduled pt a f/u with Dr. Erin Fulling for 50min appt due to pt being former Dr. Carlis Abbott pt and she is transitioning fully to hospital.  Nothing further needed.

## 2019-11-02 DIAGNOSIS — M1711 Unilateral primary osteoarthritis, right knee: Secondary | ICD-10-CM | POA: Diagnosis not present

## 2019-11-05 DIAGNOSIS — M1711 Unilateral primary osteoarthritis, right knee: Secondary | ICD-10-CM | POA: Diagnosis not present

## 2019-11-07 DIAGNOSIS — M1711 Unilateral primary osteoarthritis, right knee: Secondary | ICD-10-CM | POA: Diagnosis not present

## 2019-11-09 DIAGNOSIS — M1711 Unilateral primary osteoarthritis, right knee: Secondary | ICD-10-CM | POA: Diagnosis not present

## 2019-11-10 ENCOUNTER — Ambulatory Visit: Payer: BC Managed Care – PPO | Attending: Internal Medicine

## 2019-11-10 DIAGNOSIS — Z23 Encounter for immunization: Secondary | ICD-10-CM

## 2019-11-10 NOTE — Progress Notes (Signed)
   Covid-19 Vaccination Clinic  Name:  Debbie Miranda    MRN: [id number] DOB: [birth year]  11/10/2019  [name] was observed post Covid-19 immunization for 15 minutes without incident. She was provided with Vaccine Information Sheet and instruction to access the V-Safe system.   [name] was instructed to call 911 with any severe reactions post vaccine: Marland Kitchen Difficulty breathing  . Swelling of face and throat  . A fast heartbeat  . A bad rash all over body  . Dizziness and weakness

## 2019-11-12 DIAGNOSIS — M1711 Unilateral primary osteoarthritis, right knee: Secondary | ICD-10-CM | POA: Diagnosis not present

## 2019-11-14 DIAGNOSIS — M1711 Unilateral primary osteoarthritis, right knee: Secondary | ICD-10-CM | POA: Diagnosis not present

## 2019-11-15 DIAGNOSIS — Z96651 Presence of right artificial knee joint: Secondary | ICD-10-CM | POA: Diagnosis not present

## 2019-11-19 DIAGNOSIS — M1711 Unilateral primary osteoarthritis, right knee: Secondary | ICD-10-CM | POA: Diagnosis not present

## 2019-11-20 ENCOUNTER — Other Ambulatory Visit: Payer: Self-pay | Admitting: Family Medicine

## 2019-11-20 DIAGNOSIS — Z1231 Encounter for screening mammogram for malignant neoplasm of breast: Secondary | ICD-10-CM

## 2019-11-22 DIAGNOSIS — M1711 Unilateral primary osteoarthritis, right knee: Secondary | ICD-10-CM | POA: Diagnosis not present

## 2019-11-23 ENCOUNTER — Ambulatory Visit: Payer: BC Managed Care – PPO

## 2019-11-26 DIAGNOSIS — M1711 Unilateral primary osteoarthritis, right knee: Secondary | ICD-10-CM | POA: Diagnosis not present

## 2019-11-27 DIAGNOSIS — M1711 Unilateral primary osteoarthritis, right knee: Secondary | ICD-10-CM | POA: Diagnosis not present

## 2019-11-29 DIAGNOSIS — M1711 Unilateral primary osteoarthritis, right knee: Secondary | ICD-10-CM | POA: Diagnosis not present

## 2019-12-02 DIAGNOSIS — M1711 Unilateral primary osteoarthritis, right knee: Secondary | ICD-10-CM | POA: Diagnosis not present

## 2019-12-04 DIAGNOSIS — M1711 Unilateral primary osteoarthritis, right knee: Secondary | ICD-10-CM | POA: Diagnosis not present

## 2019-12-10 DIAGNOSIS — M1711 Unilateral primary osteoarthritis, right knee: Secondary | ICD-10-CM | POA: Diagnosis not present

## 2019-12-12 DIAGNOSIS — M1711 Unilateral primary osteoarthritis, right knee: Secondary | ICD-10-CM | POA: Diagnosis not present

## 2019-12-14 DIAGNOSIS — M1711 Unilateral primary osteoarthritis, right knee: Secondary | ICD-10-CM | POA: Diagnosis not present

## 2019-12-17 ENCOUNTER — Telehealth: Payer: Self-pay

## 2019-12-17 DIAGNOSIS — M1711 Unilateral primary osteoarthritis, right knee: Secondary | ICD-10-CM | POA: Diagnosis not present

## 2019-12-17 NOTE — Telephone Encounter (Signed)
Patient has been having trimmers in hand and not sleeping for 3 months and requesting appointment please advise where to put patient no available appointments in December.

## 2019-12-17 NOTE — Telephone Encounter (Signed)
Thank you for asking May have appointment either at 3 PM or 330 on any given day that has that available this week or next week.  Also the following Thursday I believe I have an 11:00 slot available thank you

## 2019-12-18 ENCOUNTER — Ambulatory Visit: Payer: BC Managed Care – PPO | Admitting: Pulmonary Disease

## 2019-12-19 ENCOUNTER — Other Ambulatory Visit: Payer: Self-pay

## 2019-12-19 ENCOUNTER — Ambulatory Visit: Payer: BC Managed Care – PPO | Admitting: Family Medicine

## 2019-12-19 ENCOUNTER — Encounter: Payer: Self-pay | Admitting: Family Medicine

## 2019-12-19 VITALS — BP 134/82 | HR 98 | Temp 96.1°F | Wt 230.4 lb

## 2019-12-19 DIAGNOSIS — R251 Tremor, unspecified: Secondary | ICD-10-CM

## 2019-12-19 DIAGNOSIS — G4700 Insomnia, unspecified: Secondary | ICD-10-CM

## 2019-12-19 DIAGNOSIS — M1711 Unilateral primary osteoarthritis, right knee: Secondary | ICD-10-CM | POA: Diagnosis not present

## 2019-12-19 MED ORDER — CYCLOBENZAPRINE HCL 5 MG PO TABS: ORAL_TABLET | ORAL | 5 refills | Status: DC

## 2019-12-19 NOTE — Progress Notes (Signed)
   Subjective:    Patient ID: Debbie Miranda, female    DOB: Sep 29, 1955, 64 y.o.   MRN: [id number]  HPI Pt here for issues sleeping. Pt had knee surgery a few months ago. Pt goes to sleep fine but can not fall back asleep when she wakes up. Pt has tried Melatonin but does not help patient. Patient with difficult time sleeping.  She goes to bed around 8:00 she wakes up near midnight and then she stays up the rest of the night and into the next day she does not take naps she avoids caffeine but she finds herself unable to sleep more than 3-1/2 hours she denies being excessively worried or depressed but does state that at times there is things that get on her mind and its difficult to get them off  Pt also having tremor in hands but has become better in the 3 weeks. Pt states sometimes she goes several days without noticing.  Intermittently she notices that she has a difficult time holding things steady and writing steady she denies any ataxia stiffness in the muscles.  Occasionally she gets back pain for which she takes Flexeril she states when she takes that it tends to help her sleep  Part of the reason she wakes up is because of body pains as well as where she had her knee surgery   Review of Systems    Please see above Objective:   Physical Exam Lungs are clear respiratory rate normal heart regular no tremor is noted in the hands on today's exam extremities no edema       Assessment & Plan:  Very nice patient Time was spent discussing sleep and the approach to sleep Avoid Ambien avoid Lunesta we will try to avoid nerve medication Flexeril 5 mg 1 nightly to try to help sleep I encourage patient to not go to bed before 10 PM and always have a standard get up time sleep hygiene pamphlet was given  Tremor-no cogwheeling no difficulty with walking I find no evidence of Parkinson's will monitor this it could be benign essential tremor if it gets worse she is to bring it back to our  attention  Patient to give [place] an update in a few weeks how her insomnia is doing

## 2019-12-20 NOTE — Progress Notes (Signed)
Pt has had flu shot, epic updated.

## 2019-12-21 DIAGNOSIS — M1711 Unilateral primary osteoarthritis, right knee: Secondary | ICD-10-CM | POA: Diagnosis not present

## 2019-12-24 DIAGNOSIS — M1711 Unilateral primary osteoarthritis, right knee: Secondary | ICD-10-CM | POA: Diagnosis not present

## 2019-12-25 ENCOUNTER — Ambulatory Visit: Payer: BC Managed Care – PPO | Admitting: Pulmonary Disease

## 2019-12-26 DIAGNOSIS — M1711 Unilateral primary osteoarthritis, right knee: Secondary | ICD-10-CM | POA: Diagnosis not present

## 2020-01-10 ENCOUNTER — Other Ambulatory Visit: Payer: Self-pay

## 2020-01-10 ENCOUNTER — Ambulatory Visit
Admission: RE | Admit: 2020-01-10 | Discharge: 2020-01-10 | Disposition: A | Discharge status: Home / Self Care | Payer: BC Managed Care – PPO | Source: Ambulatory Visit | Attending: Family Medicine | Admitting: Family Medicine

## 2020-01-10 DIAGNOSIS — Z1231 Encounter for screening mammogram for malignant neoplasm of breast: Secondary | ICD-10-CM

## 2020-01-12 HISTORY — PX: REPLACEMENT TOTAL KNEE BILATERAL: SUR1225

## 2020-01-14 ENCOUNTER — Ambulatory Visit: Payer: BC Managed Care – PPO | Admitting: Pulmonary Disease

## 2020-01-15 ENCOUNTER — Other Ambulatory Visit: Payer: Self-pay

## 2020-01-15 ENCOUNTER — Ambulatory Visit: Payer: BC Managed Care – PPO | Admitting: Pulmonary Disease

## 2020-01-15 ENCOUNTER — Encounter: Payer: Self-pay | Admitting: Pulmonary Disease

## 2020-01-15 VITALS — BP 118/74 | HR 78 | Temp 97.3°F | Ht 66.5 in | Wt 239.6 lb

## 2020-01-15 DIAGNOSIS — R911 Solitary pulmonary nodule: Secondary | ICD-10-CM

## 2020-01-15 DIAGNOSIS — J4530 Mild persistent asthma, uncomplicated: Secondary | ICD-10-CM | POA: Diagnosis not present

## 2020-01-15 DIAGNOSIS — Z85110 Personal history of malignant carcinoid tumor of bronchus and lung: Secondary | ICD-10-CM | POA: Diagnosis not present

## 2020-01-15 NOTE — Progress Notes (Signed)
Synopsis: Referred in March 2021 for dyspnea on exertion by Babs Sciara, MD.   Subjective:   PATIENT ID: Debbie Miranda GENDER: female DOB: 04/14/1955, MRN: [id number]   HPI  Chief Complaint  Patient presents with  . Establish Care    Former PC for Asthma. States her asthma has been uncontrol since last visit. Denies any new symptoms.    [name] is a 65 year old woman, never smoker with history of pulmonary carcinoid s/p lobectomy who returns to pulmonary clinic for follow up of mild persistent asthma.   She has been on dulera 200-63mcg 2 puffs twice daily with good control of her symptoms. She denies any wheezing, chest tightness or dyspnea. She is starting to walk outside more after having a recent knee replacement.   She continues to ask why she would develop asthma at her age. We discussed how this can happen at anytime during one's life but if she had an upper respiratory viral infection that led to reactive airways disease which could be a plausible explanation.   Past Medical History:  Diagnosis Date  . Acute ischemic colitis (HCC) 08/09/2018  . Anal itching 10/27/2011  . Arthritis   . Cancer (HCC)    lung-7years ago  . 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 Cataract    beginning stages,bilateral  . Contact lens/glasses fitting    wares contacts or glasses  . Corn of toe 5th right 08/16/2012  . Costochondritis   . Diverticulitis 2012  . Dysplastic nevi   . Esophageal reflux   . External hemorrhoids without mention of complication 2007  . GERD with stricture 05/16/2012  . Headache(784.0)   . HTN (hypertension) 08/09/2018  . Hyperlipidemia   . Hypertension    pt denies  . Hypothyroidism   . IFG (impaired fasting glucose)   . Impaired fasting glucose 05/16/2012  . Mycotic toenails 12/10/2010   1st toenail right-lasered nail 12/10/2010-present (last on 06/15/2012)  . Neuropathic pain 06/30/2016  . Obesity   . Personal history of colonic polyps 07/30/2015   SSA   . PONV (postoperative nausea and vomiting)   . Positive H. pylori test   . Thyroid nodule 05/16/2012   Benign, 2013, [place]   . Urge incontinence 07/05/2018  . Venous insufficiency of left leg 03/29/2016     Family History  Problem Relation Age of Onset  . Kidney cancer Father   . Pulmonary embolism Mother   . Breast cancer Maternal Grandmother   . Cancer Maternal Grandmother        breast  . Heart disease Maternal Grandfather   . Colon cancer Neg Hx   . Colon polyps Neg Hx   . Esophageal cancer Neg Hx   . Rectal cancer Neg Hx   . Stomach cancer Neg Hx      Social History   Socioeconomic History  . Marital status: Married    Spouse name: Not on file  . Number of children: 1  . Years of education: Not on file  . Highest education level: Not on file  Occupational History  . Occupation: homemaker  Tobacco Use  . Smoking status: Never Smoker  . Smokeless tobacco: Never Used  Vaping Use  . Vaping Use: Never used  Substance and Sexual Activity  . Alcohol use: Yes    Alcohol/week: 0.0 standard drinks    Comment: wine-occ  . Drug use: No  . Sexual activity: Yes    Birth control/protection: Surgical  Other Topics Concern  . Not on file  Social History Narrative   She is married, she is a homemaker, she has 1 son who is a Proofreadermedical doctor, he is an Education officer, environmentalotolaryngologist [place]   Very occasional alcohol never smoker no tobacco no drug use   Social Determinants of Corporate investment bankerHealth   Financial Resource Strain: Not on file  Food Insecurity: Not on file  Transportation Needs: Not on file  Physical Activity: Not on file  Stress: Not on file  Social Connections: Not on file  Intimate Partner Violence: Not on file     Allergies  Allergen Reactions  . Codeine Nausea And Vomiting     Outpatient Medications Prior to Visit  Medication Sig Dispense Refill  . aspirin 81 MG tablet Take 81 mg by mouth daily.    . Biotin 5000 MCG CAPS Take 5,000 mcg by mouth  daily with breakfast.    . Cholecalciferol (VITAMIN D-3) 25 MCG (1000 UT) CAPS Take 1,000 Units by mouth daily with breakfast.     . cyclobenzaprine (FLEXERIL) 5 MG tablet 1 qhs prn 30 tablet 5  . Esomeprazole Magnesium (NEXIUM 24HR) 20 MG TBEC Take 20 mg by mouth daily.     Marland Kitchen. levothyroxine (SYNTHROID) 125 MCG tablet Take 1 tablet (125 mcg total) by mouth every morning. 90 tablet 1  . Methylcellulose, Laxative, (CITRUCEL) 500 MG TABS Take 500 mg by mouth daily.     . mometasone-formoterol (DULERA) 200-5 MCG/ACT AERO Inhale 2 puffs into the lungs in the morning and at bedtime. 13 g 5  . montelukast (SINGULAIR) 10 MG tablet Take 1 tablet (10 mg total) by mouth daily before breakfast. 30 tablet 11  . Multiple Vitamins-Minerals (CENTRUM SILVER) tablet Take 1 tablet by mouth daily with breakfast.     . naproxen sodium (ALEVE) 220 MG tablet Take 220-440 mg by mouth daily as needed (for pain or headaches).     . Polyethylene Glycol 400 (BLINK TEARS) 0.25 % SOLN Place 1 drop into both eyes daily as needed (Dry eyes).    . rosuvastatin (CRESTOR) 20 MG tablet Take 1 tablet (20 mg total) by mouth daily. 90 tablet 3   No facility-administered medications prior to visit.    Review of Systems  Constitutional: Negative for chills, diaphoresis, fever, malaise/fatigue and weight loss.  HENT: Negative for congestion, sinus pain and sore throat.   Eyes: Negative.   Respiratory: Negative for cough, hemoptysis, sputum production, shortness of breath and wheezing.   Cardiovascular: Negative for chest pain, palpitations, orthopnea and leg swelling.  Gastrointestinal: Negative for abdominal pain, diarrhea, heartburn, nausea and vomiting.  Genitourinary: Negative.   Musculoskeletal: Negative.   Skin: Negative for rash.  Neurological: Negative.   Endo/Heme/Allergies: Negative.   Psychiatric/Behavioral: Negative.    Objective:   Vitals:   01/15/20 0924  BP: 118/74  Pulse: 78  Temp: (!) 97.3 F (36.3 C)   TempSrc: Temporal  SpO2: 100%  Weight: 239 lb 9.6 oz (108.7 kg)  Height: 5' 6.5" (1.689 m)     Physical Exam Constitutional:      General: She is not in acute distress.    Appearance: Normal appearance. She is obese.  HENT:     Head: Normocephalic and atraumatic.  Eyes:     General: No scleral icterus.    Conjunctiva/sclera: Conjunctivae normal.     Pupils: Pupils are equal, round, and reactive to light.  Cardiovascular:     Rate and Rhythm: Normal rate and regular rhythm.  Pulses: Normal pulses.     Heart sounds: Normal heart sounds. No murmur heard.   Pulmonary:     Effort: Pulmonary effort is normal.     Breath sounds: Normal breath sounds. No wheezing.  Abdominal:     General: Bowel sounds are normal.     Palpations: Abdomen is soft.  Musculoskeletal:     Right lower leg: No edema.     Left lower leg: No edema.  Skin:    General: Skin is warm and dry.     Capillary Refill: Capillary refill takes less than 2 seconds.  Neurological:     General: No focal deficit present.     Mental Status: She is alert.  Psychiatric:        Mood and Affect: Mood normal.        Behavior: Behavior normal.        Thought Content: Thought content normal.        Judgment: Judgment normal.    CBC    Component Value Date/Time   WBC 7.3 08/24/2018 0919   WBC 11.9 (H) 08/10/2018 0829   RBC 4.57 08/24/2018 0919   RBC 4.41 08/10/2018 0829   HGB 14.6 08/24/2018 0919   HCT 44.1 08/24/2018 0919   PLT 182 08/24/2018 0919   MCV 97 08/24/2018 0919   MCH 31.9 08/24/2018 0919   MCH 32.0 08/10/2018 0829   MCHC 33.1 08/24/2018 0919   MCHC 32.9 08/10/2018 0829   RDW 12.6 08/24/2018 0919   LYMPHSABS 1.9 08/24/2018 0919   MONOABS 0.4 07/16/2013 0705   EOSABS 0.1 08/24/2018 0919   BASOSABS 0.0 08/24/2018 0919   Chest imaging: CXR, 2 view 03/12/2019- rounded density on lateral view anterior to carina, possibly prominent pulmonary artery, similar in appearance to CXR in 2012.  Excessive  lucency in the posterior lower hemithorax.  CT chest 2013-left lower lobe nodule 1.4 x 1.0 cm, subpleural nodule in the right.  Subcentimeter left upper lobe nodule.  Well-circumscribed density in the mediastinum just anterior to the carina.  Reports from CT scans at [facility], most recently 2018 reviewed-postoperative changes from left lower lobectomy without evidence of recurrent disease.  Scattered pulmonary nodules-LUL 3 mm stable, LUL 8 mm stable, RLL 3 mm stable.  Other scattered nodules not further described.  CT chest 03/28/2019- right upper lobe 8 x 42mm subpleural nodule in the anterior apex.  Postsurgical changes of the left lower lobectomy.  Left upper lobe nodule, minimally changed from 2013.  Right lower lobe calcified granuloma and small subpleural nodule, stable.  CT chest 06/21/2019- persistent subpleural anterior RUL opacity and lobulated LUL-with stable.  Scattered groundglass opacities bilaterally.  No significant mediastinal or hilar adenopathy.  PFT: PFT Results Latest Ref Rng & Units 04/05/2019  FVC-Pre L 2.38  FVC-Predicted Pre % 63  FVC-Post L 2.55  FVC-Predicted Post % 68  Pre FEV1/FVC % % 73  Post FEV1/FCV % % 76  FEV1-Pre L 1.73  FEV1-Predicted Pre % 60  FEV1-Post L 1.92  DLCO uncorrected ml/min/mmHg 25.19  DLCO UNC% % 110  DLCO corrected ml/min/mmHg 25.19  DLCO COR %Predicted % 110  DLVA Predicted % 146  TLC L 4.23  TLC % Predicted % 74  RV % Predicted % 76    Echo: 04/09/19 1. Normal LV systolic function; mild LVH: grade 1 diastolic dysfunction.  2. Left ventricular ejection fraction, by estimation, is 60 to 65%. The  left ventricle has normal function. The left ventricle has no regional  wall  motion abnormalities. There is mild left ventricular hypertrophy.  Left ventricular diastolic parameters  are consistent with Grade I diastolic dysfunction (impaired relaxation).  Elevated left atrial pressure.  3. Right ventricular systolic function  is normal. The right ventricular  size is normal.  4. The mitral valve is normal in structure. Trivial mitral valve  regurgitation. No evidence of mitral stenosis.  5. The aortic valve has an indeterminant number of cusps. Aortic valve  regurgitation is not visualized. No aortic stenosis is present.  6. The inferior vena cava is normal in size with greater than 50%  respiratory variability, suggesting right atrial pressure of 3 mmHg.  Assessment & Plan:   Mild persistent asthma without complication  Pulmonary nodule  History of malignant carcinoid tumor of bronchus and lung  Discussion: Kanija Romain is a 65 year old woman, never smoker with history of pulmonary carcinoid s/p lobectomy who returns to pulmonary clinic for follow up of mild persistent asthma.   She has done well on dulera 200-64mcg 2 puffs twice daily and will continue on this regimen until next follow up appointment. If she continues to do well, we will then de-escalate her therapy. She is to continue singulair.   She has chronic restrictive defect on pulmonary function testing related to her previous lobectomy.   For her pulmonary nodules we will plan for a CT chest scan in 06/2020 which will be 1 year from her last scan.  Follow up in 4 months  Freda Jackson, MD [facility] Office: [phone]   See Amion for Pager Details     Current Outpatient Medications:  .  aspirin 81 MG tablet, Take 81 mg by mouth daily., Disp: , Rfl:  .  Biotin 5000 MCG CAPS, Take 5,000 mcg by mouth daily with breakfast., Disp: , Rfl:  .  Cholecalciferol (VITAMIN D-3) 25 MCG (1000 UT) CAPS, Take 1,000 Units by mouth daily with breakfast. , Disp: , Rfl:  .  cyclobenzaprine (FLEXERIL) 5 MG tablet, 1 qhs prn, Disp: 30 tablet, Rfl: 5 .  Esomeprazole Magnesium (NEXIUM 24HR) 20 MG TBEC, Take 20 mg by mouth daily. , Disp: , Rfl:  .  levothyroxine (SYNTHROID) 125 MCG tablet, Take 1 tablet (125 mcg total) by mouth  every morning., Disp: 90 tablet, Rfl: 1 .  Methylcellulose, Laxative, (CITRUCEL) 500 MG TABS, Take 500 mg by mouth daily. , Disp: , Rfl:  .  mometasone-formoterol (DULERA) 200-5 MCG/ACT AERO, Inhale 2 puffs into the lungs in the morning and at bedtime., Disp: 13 g, Rfl: 5 .  montelukast (SINGULAIR) 10 MG tablet, Take 1 tablet (10 mg total) by mouth daily before breakfast., Disp: 30 tablet, Rfl: 11 .  Multiple Vitamins-Minerals (CENTRUM SILVER) tablet, Take 1 tablet by mouth daily with breakfast. , Disp: , Rfl:  .  naproxen sodium (ALEVE) 220 MG tablet, Take 220-440 mg by mouth daily as needed (for pain or headaches). , Disp: , Rfl:  .  Polyethylene Glycol 400 (BLINK TEARS) 0.25 % SOLN, Place 1 drop into both eyes daily as needed (Dry eyes)., Disp: , Rfl:  .  rosuvastatin (CRESTOR) 20 MG tablet, Take 1 tablet (20 mg total) by mouth daily., Disp: 90 tablet, Rfl: 3

## 2020-01-15 NOTE — Patient Instructions (Addendum)
Continue dulera 200-49mcg 2 puffs twice daily  Continue montelukast daily  We will schedule you for a CT Chest scan in June to follow your nodules (previously ordered by Dr. Chestine Spore)

## 2020-01-18 ENCOUNTER — Telehealth: Payer: Self-pay

## 2020-01-18 ENCOUNTER — Encounter: Payer: Self-pay | Admitting: Family Medicine

## 2020-01-18 NOTE — Telephone Encounter (Signed)
No call back needed, patient just giving an FYI that the muscle relaxer Dr. Nicki Reaper put her on is helping her sleep.  She said he wanted to know if it is helping and it is.  She is good for now.

## 2020-02-05 DIAGNOSIS — L814 Other melanin hyperpigmentation: Secondary | ICD-10-CM | POA: Diagnosis not present

## 2020-02-05 DIAGNOSIS — L718 Other rosacea: Secondary | ICD-10-CM | POA: Diagnosis not present

## 2020-02-05 DIAGNOSIS — Z85828 Personal history of other malignant neoplasm of skin: Secondary | ICD-10-CM | POA: Diagnosis not present

## 2020-02-05 DIAGNOSIS — L821 Other seborrheic keratosis: Secondary | ICD-10-CM | POA: Diagnosis not present

## 2020-02-21 DIAGNOSIS — M25562 Pain in left knee: Secondary | ICD-10-CM | POA: Diagnosis not present

## 2020-02-21 DIAGNOSIS — Z96651 Presence of right artificial knee joint: Secondary | ICD-10-CM | POA: Diagnosis not present

## 2020-03-27 DIAGNOSIS — M1712 Unilateral primary osteoarthritis, left knee: Secondary | ICD-10-CM | POA: Diagnosis not present

## 2020-03-27 DIAGNOSIS — Z96651 Presence of right artificial knee joint: Secondary | ICD-10-CM | POA: Diagnosis not present

## 2020-03-28 ENCOUNTER — Telehealth: Payer: Self-pay | Admitting: *Deleted

## 2020-03-28 NOTE — Telephone Encounter (Signed)
   Primary Cardiologist: Candee Furbish, MD  Chart reviewed as part of pre-operative protocol coverage. Patient was contacted 03/28/2020 in reference to pre-operative risk assessment for pending surgery as outlined below.  Debbie Miranda was last seen on 05/21/19 by Dr. Marlou Porch.  Since that day, Debbie Miranda has done well from a cardiac standpoint. She can easily complete 4 METs without anginal complaints.   Therefore, based on ACC/AHA guidelines, the patient would be at acceptable risk for the planned procedure without further cardiovascular testing.   The patient was advised that if she develops new symptoms prior to surgery to contact our office to arrange for a follow-up visit, and she verbalized understanding.  If needed, patient can hold aspirin 5-7 days prior to her upcoming surgery with plans to restart when cleared to do so by her orthopedist.   I will route this recommendation to the requesting party via [facility] fax function and remove from pre-op pool. Please call with questions.  Abigail Butts, PA-C 03/28/2020, 12:58 PM

## 2020-03-28 NOTE — Telephone Encounter (Signed)
    [facility] Pre-operative Risk Assessment    HEARTCARE STAFF: - Please ensure there is not already an duplicate clearance open for this procedure. - Under Visit Info/Reason for Call, type in Other and utilize the format Clearance MM/DD/YY or Clearance TBD. Do not use dashes or single digits. - If request is for dental extraction, please clarify the # of teeth to be extracted.  Request for surgical clearance:  1. What type of surgery is being performed? LEFT TOTAL KNEE ARTHROPLASTY   2. When is this surgery scheduled? 07/01/20   3. What type of clearance is required (medical clearance vs. Pharmacy clearance to hold med vs. Both)? MEDICAL  4. Are there any medications that need to be held prior to surgery and how long? ASA    5. Practice name and name of physician performing surgery? [facility]; DR. FRANK ALUISIO   6. What is the office phone number? [phone]   7.   What is the office fax number? [phone] ATTN: [facility]  8.   Anesthesia type (None, local, MAC, general) ? CHOICE   Julaine Hua 03/28/2020, 11:40 AM  _________________________________________________________________   (provider comments below)

## 2020-03-31 ENCOUNTER — Telehealth: Payer: Self-pay | Admitting: Pulmonary Disease

## 2020-03-31 NOTE — Telephone Encounter (Signed)
Scheduled pt's CT for June.  Gave pt info.  Nothing further needed.

## 2020-03-31 NOTE — Telephone Encounter (Signed)
CT not due til June.  Spoke to pt & she is aware of this but wants it on the books due to impending surgery and lot going on in June.  Left vm for [facility] to call me.

## 2020-04-07 ENCOUNTER — Other Ambulatory Visit: Payer: Self-pay | Admitting: Family Medicine

## 2020-04-07 ENCOUNTER — Other Ambulatory Visit: Payer: Self-pay | Admitting: Critical Care Medicine

## 2020-04-07 DIAGNOSIS — Z0000 Encounter for general adult medical examination without abnormal findings: Secondary | ICD-10-CM

## 2020-04-07 NOTE — Telephone Encounter (Signed)
May have 90-day with 1 refill Keep follow-up visit in May Please do lab work before May visit including lipid, CMP, TSH for hyperlipidemia and hypothyroidism and medication use

## 2020-04-30 ENCOUNTER — Other Ambulatory Visit: Payer: Self-pay | Admitting: Cardiology

## 2020-04-30 ENCOUNTER — Other Ambulatory Visit: Payer: Self-pay | Admitting: Family Medicine

## 2020-05-13 ENCOUNTER — Encounter: Payer: Self-pay | Admitting: Family Medicine

## 2020-05-13 ENCOUNTER — Ambulatory Visit (INDEPENDENT_AMBULATORY_CARE_PROVIDER_SITE_OTHER): Payer: BC Managed Care – PPO | Admitting: Family Medicine

## 2020-05-13 ENCOUNTER — Other Ambulatory Visit: Payer: Self-pay

## 2020-05-13 VITALS — BP 120/82 | HR 87 | Temp 97.3°F | Ht 66.5 in | Wt 246.8 lb

## 2020-05-13 DIAGNOSIS — G4700 Insomnia, unspecified: Secondary | ICD-10-CM

## 2020-05-13 DIAGNOSIS — Z01818 Encounter for other preprocedural examination: Secondary | ICD-10-CM

## 2020-05-13 DIAGNOSIS — R5383 Other fatigue: Secondary | ICD-10-CM

## 2020-05-13 DIAGNOSIS — E785 Hyperlipidemia, unspecified: Secondary | ICD-10-CM | POA: Diagnosis not present

## 2020-05-13 DIAGNOSIS — E039 Hypothyroidism, unspecified: Secondary | ICD-10-CM

## 2020-05-13 MED ORDER — CYCLOBENZAPRINE HCL 5 MG PO TABS: ORAL_TABLET | ORAL | 6 refills | Status: DC

## 2020-05-13 MED ORDER — ROSUVASTATIN CALCIUM 20 MG PO TABS: 20.0000 mg | ORAL_TABLET | Freq: Every day | ORAL | 1 refills | Status: DC

## 2020-05-13 NOTE — Patient Instructions (Signed)

## 2020-05-13 NOTE — Progress Notes (Signed)
   Subjective:    Patient ID: Debbie Miranda, female    DOB: Nov 19, 1955, 65 y.o.   MRN: [id number]  HPI  Patient arrives for medical clearance for knee surgery 07/01/20 Patient is already had right knee surgery Going to be having left knee surgery coming up Patient overall has had fairly good health She has had recent work-up by cardiology within the past couple years which came out looking good She does have a asthma related condition for which they are treating her She is seeing pulmonary for this  Hyperlipidemia, unspecified hyperlipidemia type - Plan: Lipid panel  Hypothyroidism, unspecified type - Plan: TSH  Other fatigue - Plan: Comprehensive metabolic panel, CBC with Differential/Platelet  Preop examination  Insomnia, unspecified type   Review of Systems Denies any chest tightness pressure pain shortness of breath able to walk on flat ground without shortness of breath difficult for her to go up steps because of her current arthritis in the left knee and previous surgery in the right knee    Objective:   Physical Exam  Lungs are clear respiratory rate normal heart regular pulse normal BP good extremities no edema skin warm dry Decreased range of motion of the right shoulder     Assessment & Plan:  1. Hyperlipidemia, unspecified hyperlipidemia type Cholesterol profile recommended continue medication - Lipid panel  2. Hypothyroidism, unspecified type Continue thyroid medication check TSH - TSH  3. Other fatigue CBC ordered await results - Comprehensive metabolic panel - CBC with Differential/Platelet  4. Preop examination Preop examination approved for surgery  There are no current medical issues going on that would preclude her from doing surgery. She has had previous cardiology clearance for this. She has undergone previous knee surgery She is back to doing mild amount of activity It is advisable for her to follow the directions of the orthopedist  regarding prevention of any blood clots from surgery We will send documentation to her surgeon.  5. Insomnia, unspecified type Recommend Flexeril in the evening time 5 mg when necessary 10 mg for back spasms caution drowsiness

## 2020-05-14 LAB — CBC WITH DIFFERENTIAL/PLATELET
Basophils Absolute: 0 10*3/uL (ref 0.0–0.2)
Basos: 0 %
EOS (ABSOLUTE): 0 10*3/uL (ref 0.0–0.4)
Eos: 1 %
Hematocrit: 44.5 % (ref 34.0–46.6)
Hemoglobin: 15.1 g/dL (ref 11.1–15.9)
Immature Grans (Abs): 0 10*3/uL (ref 0.0–0.1)
Immature Granulocytes: 0 %
Lymphocytes Absolute: 1.7 10*3/uL (ref 0.7–3.1)
Lymphs: 30 %
MCH: 32 pg (ref 26.6–33.0)
MCHC: 33.9 g/dL (ref 31.5–35.7)
MCV: 94 fL (ref 79–97)
Monocytes Absolute: 0.5 10*3/uL (ref 0.1–0.9)
Monocytes: 8 %
Neutrophils Absolute: 3.4 10*3/uL (ref 1.4–7.0)
Neutrophils: 61 %
Platelets: 216 10*3/uL (ref 150–450)
RBC: 4.72 x10E6/uL (ref 3.77–5.28)
RDW: 12.5 % (ref 11.7–15.4)
WBC: 5.6 10*3/uL (ref 3.4–10.8)

## 2020-05-14 LAB — LIPID PANEL
Chol/HDL Ratio: 2.3 ratio (ref 0.0–4.4)
Cholesterol, Total: 166 mg/dL (ref 100–199)
HDL: 71 mg/dL (ref >39)
LDL Chol Calc (NIH): 81 mg/dL (ref 0–99)
Triglycerides: 76 mg/dL (ref 0–149)
VLDL Cholesterol Cal: 14 mg/dL (ref 5–40)

## 2020-05-14 LAB — COMPREHENSIVE METABOLIC PANEL
ALT: 19 IU/L (ref 0–32)
AST: 20 IU/L (ref 0–40)
Albumin/Globulin Ratio: 2.3 — ABNORMAL HIGH (ref 1.2–2.2)
Albumin: 4.6 g/dL (ref 3.8–4.8)
Alkaline Phosphatase: 82 IU/L (ref 44–121)
BUN/Creatinine Ratio: 19 (ref 12–28)
BUN: 14 mg/dL (ref 8–27)
Bilirubin Total: 0.3 mg/dL (ref 0.0–1.2)
CO2: 24 mmol/L (ref 20–29)
Calcium: 9.3 mg/dL (ref 8.7–10.3)
Chloride: 101 mmol/L (ref 96–106)
Creatinine, Ser: 0.75 mg/dL (ref 0.57–1.00)
Globulin, Total: 2 g/dL (ref 1.5–4.5)
Glucose: 90 mg/dL (ref 65–99)
Potassium: 4.8 mmol/L (ref 3.5–5.2)
Sodium: 141 mmol/L (ref 134–144)
Total Protein: 6.6 g/dL (ref 6.0–8.5)
eGFR: 89 mL/min/{1.73_m2} (ref >59)

## 2020-05-14 LAB — TSH: TSH: 3.78 u[IU]/mL (ref 0.450–4.500)

## 2020-05-20 ENCOUNTER — Encounter: Payer: Self-pay | Admitting: Pulmonary Disease

## 2020-05-20 ENCOUNTER — Ambulatory Visit: Payer: BC Managed Care – PPO | Admitting: Pulmonary Disease

## 2020-05-20 ENCOUNTER — Other Ambulatory Visit: Payer: Self-pay

## 2020-05-20 VITALS — BP 128/82 | HR 71 | Temp 97.7°F | Ht 66.5 in | Wt 245.0 lb

## 2020-05-20 DIAGNOSIS — J4530 Mild persistent asthma, uncomplicated: Secondary | ICD-10-CM

## 2020-05-20 NOTE — Patient Instructions (Signed)
Start tapering dulera inhaler to 1 puff twice daily for the next 2 weeks. If no increase in breathing symptoms then taper to 1 puff daily. Again, if no increase in breathing symptoms after 2 weeks, then can stop using dulera scheduled and use it as needed for cough, wheezing, chest tightness or shortness of breath.   Continue singulair daily  We will call you or message you via myChart regarding your CT chest scan results next month.

## 2020-05-20 NOTE — Progress Notes (Signed)
Synopsis: Referred in March 2021 for dyspnea on exertion by Kathyrn Drown, MD.   Subjective:   PATIENT ID: Debbie Miranda GENDER: female DOB: Aug 22, 1955, MRN: [id number]  Not using albuterol inhaler  Scheduled for CT chest next month.   HPI  Chief Complaint  Patient presents with  . Follow-up    4 mo f/u for asthma. States she is still using the [facility]. Her breathing has been stable. The [facility] is expensive.    Debbie Miranda is a 65 year old woman, never smoker with history of pulmonary carcinoid s/p lobectomy who returns to pulmonary clinic for follow up of mild persistent asthma.   She continues on dulera 200-23mcg 2 puffs twice daily with good control of her symptoms. She is not needing to use her albuterol inhaler. She denies night time awakenings. Her physical activity level has improved since her knee replacement surgery.   She has a CT chest scheduled for next month to follow up on her pulmonary nodules.   Past Medical History:  Diagnosis Date  . Acute ischemic colitis ([place]) 08/09/2018  . Anal itching 10/27/2011  . Arthritis   . Cancer ([place])    lung-7years ago  . 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 Cataract    beginning stages,bilateral  . Contact lens/glasses fitting    wares contacts or glasses  . Corn of toe 5th right 08/16/2012  . Costochondritis   . Diverticulitis 2012  . Dysplastic nevi   . Esophageal reflux   . External hemorrhoids without mention of complication [id number]  . GERD with stricture 05/16/2012  . Headache(784.0)   . HTN (hypertension) 08/09/2018  . Hyperlipidemia   . Hypertension    pt denies  . Hypothyroidism   . IFG (impaired fasting glucose)   . Impaired fasting glucose 05/16/2012  . Mycotic toenails 12/10/2010   1st toenail right-lasered nail 12/10/2010-present (last on 06/15/2012)  . Neuropathic pain 06/30/2016  . Obesity   . Personal history of colonic polyps 07/30/2015   SSA  . PONV (postoperative nausea and vomiting)   . Positive H.  pylori test   . Thyroid nodule 05/16/2012   Benign, 2013, [place]   . Urge incontinence 07/05/2018  . Venous insufficiency of left leg 03/29/2016     Family History  Problem Relation Age of Onset  . Kidney cancer Father   . Pulmonary embolism Mother   . Breast cancer Maternal Grandmother   . Cancer Maternal Grandmother        breast  . Heart disease Maternal Grandfather   . Colon cancer Neg Hx   . Colon polyps Neg Hx   . Esophageal cancer Neg Hx   . Rectal cancer Neg Hx   . Stomach cancer Neg Hx      Social History   Socioeconomic History  . Marital status: Married    Spouse name: Not on file  . Number of children: 1  . Years of education: Not on file  . Highest education level: Not on file  Occupational History  . Occupation: homemaker  Tobacco Use  . Smoking status: Never Smoker  . Smokeless tobacco: Never Used  Vaping Use  . Vaping Use: Never used  Substance and Sexual Activity  . Alcohol use: Yes    Alcohol/week: 0.0 standard drinks    Comment: wine-occ  . Drug use: No  . Sexual activity: Yes    Birth control/protection: Surgical  Other Topics Concern  . Not on file  Social  History Narrative   She is married, she is a homemaker, she has 1 son who is a Sport and exercise psychologist, he is an Engineer, mining in [place]   Very occasional alcohol never smoker no tobacco no drug use   Social Determinants of Radio broadcast assistant Strain: Not on file  Food Insecurity: Not on file  Transportation Needs: Not on file  Physical Activity: Not on file  Stress: Not on file  Social Connections: Not on file  Intimate Partner Violence: Not on file     Allergies  Allergen Reactions  . Codeine Nausea And Vomiting     Outpatient Medications Prior to Visit  Medication Sig Dispense Refill  . Biotin 5000 MCG CAPS Take 5,000 mcg by mouth daily with breakfast.    . Cholecalciferol (VITAMIN D-3) 25 MCG (1000 UT) CAPS Take 1,000 Units by mouth daily with  breakfast.     . cyclobenzaprine (FLEXERIL) 5 MG tablet 1 to 2 qhs prn insomnia or back spasms 40 tablet 6  . Esomeprazole Magnesium (NEXIUM 24HR) 20 MG TBEC Take 20 mg by mouth daily.     Marland Kitchen levothyroxine (SYNTHROID) 125 MCG tablet TAKE ONE TABLET BY MOUTH EVERY MORNING 90 tablet 1  . Methylcellulose, Laxative, (CITRUCEL) 500 MG TABS Take 500 mg by mouth daily.     . mometasone-formoterol (DULERA) 200-5 MCG/ACT AERO Inhale 2 puffs into the lungs in the morning and at bedtime. 13 g 5  . montelukast (SINGULAIR) 10 MG tablet TAKE ONE (1) TABLET BY MOUTH EVERY DAY BEFORE BREAKFAST 30 tablet 11  . Multiple Vitamins-Minerals (CENTRUM SILVER) tablet Take 1 tablet by mouth daily with breakfast.     . rosuvastatin (CRESTOR) 20 MG tablet Take 1 tablet (20 mg total) by mouth daily. 90 tablet 1  . naproxen sodium (ALEVE) 220 MG tablet Take 220-440 mg by mouth daily as needed (for pain or headaches).  (Patient not taking: Reported on 05/13/2020)    . Polyethylene Glycol 400 (BLINK TEARS) 0.25 % SOLN Place 1 drop into both eyes daily as needed (Dry eyes). (Patient not taking: Reported on 05/13/2020)     No facility-administered medications prior to visit.    Review of Systems  Constitutional: Negative for chills, diaphoresis, fever, malaise/fatigue and weight loss.  HENT: Negative for congestion, sinus pain and sore throat.   Eyes: Negative.   Respiratory: Negative for cough, hemoptysis, sputum production, shortness of breath and wheezing.   Cardiovascular: Negative for chest pain, palpitations, orthopnea and leg swelling.  Gastrointestinal: Negative for abdominal pain, diarrhea, heartburn, nausea and vomiting.  Genitourinary: Negative.   Musculoskeletal: Negative.   Skin: Negative for rash.  Neurological: Negative.   Endo/Heme/Allergies: Negative.   Psychiatric/Behavioral: Negative.    Objective:   Vitals:   05/20/20 1119  BP: 128/82  Pulse: 71  Temp: 97.7 F (36.5 C)  TempSrc: Temporal  SpO2:  99%  Weight: 245 lb (111.1 kg)  Height: 5' 6.5" (1.689 m)     Physical Exam Constitutional:      General: She is not in acute distress.    Appearance: Normal appearance. She is obese.  HENT:     Head: Normocephalic and atraumatic.  Eyes:     General: No scleral icterus.    Conjunctiva/sclera: Conjunctivae normal.     Pupils: Pupils are equal, round, and reactive to light.  Cardiovascular:     Rate and Rhythm: Normal rate and regular rhythm.     Pulses: Normal pulses.     Heart sounds:  Normal heart sounds. No murmur heard.   Pulmonary:     Effort: Pulmonary effort is normal.     Breath sounds: Normal breath sounds. No wheezing.  Abdominal:     General: Bowel sounds are normal.     Palpations: Abdomen is soft.  Musculoskeletal:     Right lower leg: No edema.     Left lower leg: No edema.  Skin:    General: Skin is warm and dry.     Capillary Refill: Capillary refill takes less than 2 seconds.  Neurological:     General: No focal deficit present.     Mental Status: She is alert.  Psychiatric:        Mood and Affect: Mood normal.        Behavior: Behavior normal.        Thought Content: Thought content normal.        Judgment: Judgment normal.    CBC    Component Value Date/Time   WBC 5.6 05/13/2020 1012   WBC 11.9 (H) 08/10/2018 0829   RBC 4.72 05/13/2020 1012   RBC 4.41 08/10/2018 0829   HGB 15.1 05/13/2020 1012   HCT 44.5 05/13/2020 1012   PLT 216 05/13/2020 1012   MCV 94 05/13/2020 1012   MCH 32.0 05/13/2020 1012   MCH 32.0 08/10/2018 0829   MCHC 33.9 05/13/2020 1012   MCHC 32.9 08/10/2018 0829   RDW 12.5 05/13/2020 1012   LYMPHSABS 1.7 05/13/2020 1012   MONOABS 0.4 07/16/2013 0705   EOSABS 0.0 05/13/2020 1012   BASOSABS 0.0 05/13/2020 1012   Chest imaging: CXR, 2 view 03/12/2019- rounded density on lateral view anterior to carina, possibly prominent pulmonary artery, similar in appearance to CXR in 2012.  Excessive lucency in the posterior lower  hemithorax.  CT chest 2013-left lower lobe nodule 1.4 x 1.0 cm, subpleural nodule in the right.  Subcentimeter left upper lobe nodule.  Well-circumscribed density in the mediastinum just anterior to the carina.  Reports from CT scans at [facility], most recently 2018 reviewed-postoperative changes from left lower lobectomy without evidence of recurrent disease.  Scattered pulmonary nodules-LUL 3 mm stable, LUL 8 mm stable, RLL 3 mm stable.  Other scattered nodules not further described.  CT chest 03/28/2019- right upper lobe 8 x 69mm subpleural nodule in the anterior apex.  Postsurgical changes of the left lower lobectomy.  Left upper lobe nodule, minimally changed from 2013.  Right lower lobe calcified granuloma and small subpleural nodule, stable.  CT chest 06/21/2019- persistent subpleural anterior RUL opacity and lobulated LUL-with stable.  Scattered groundglass opacities bilaterally.  No significant mediastinal or hilar adenopathy.  PFT: PFT Results Latest Ref Rng & Units 04/05/2019  FVC-Pre L 2.38  FVC-Predicted Pre % 63  FVC-Post L 2.55  FVC-Predicted Post % 68  Pre FEV1/FVC % % 73  Post FEV1/FCV % % 76  FEV1-Pre L 1.73  FEV1-Predicted Pre % 60  FEV1-Post L 1.92  DLCO uncorrected ml/min/mmHg 25.19  DLCO UNC% % 110  DLCO corrected ml/min/mmHg 25.19  DLCO COR %Predicted % 110  DLVA Predicted % 146  TLC L 4.23  TLC % Predicted % 74  RV % Predicted % 76    Echo: 04/09/19 1. Normal LV systolic function; mild LVH: grade 1 diastolic dysfunction.  2. Left ventricular ejection fraction, by estimation, is 60 to 65%. The  left ventricle has normal function. The left ventricle has no regional  wall motion abnormalities. There is mild left ventricular hypertrophy.  Left ventricular diastolic parameters  are consistent with Grade I diastolic dysfunction (impaired relaxation).  Elevated left atrial pressure.  3. Right ventricular systolic function is normal. The right  ventricular  size is normal.  4. The mitral valve is normal in structure. Trivial mitral valve  regurgitation. No evidence of mitral stenosis.  5. The aortic valve has an indeterminant number of cusps. Aortic valve  regurgitation is not visualized. No aortic stenosis is present.  6. The inferior vena cava is normal in size with greater than 50%  respiratory variability, suggesting right atrial pressure of 3 mmHg.  Assessment & Plan:   Mild persistent asthma without complication  Discussion: Debbie Miranda is a 64 year old woman, never smoker with history of pulmonary carcinoid s/p lobectomy who returns to pulmonary clinic for follow up of mild persistent asthma.   She has done well on dulera 200-73mcg 2 puffs twice daily without need for her rescue inhaler. We will work on tapering down her use of the dulera inhaler. She can reduced her use to 1 puff twice daily and if that goes well she can reduce further to puff daily, then to as needed use.   She is to continue Singulair daily.   For her pulmonary nodules we will follow up the CT chest scan next month 06/2020.  Follow up in 6 months  Freda Jackson, MD [facility] Office: [phone]   See Amion for Pager Details     Current Outpatient Medications:  .  Biotin 5000 MCG CAPS, Take 5,000 mcg by mouth daily with breakfast., Disp: , Rfl:  .  Cholecalciferol (VITAMIN D-3) 25 MCG (1000 UT) CAPS, Take 1,000 Units by mouth daily with breakfast. , Disp: , Rfl:  .  cyclobenzaprine (FLEXERIL) 5 MG tablet, 1 to 2 qhs prn insomnia or back spasms, Disp: 40 tablet, Rfl: 6 .  Esomeprazole Magnesium (NEXIUM 24HR) 20 MG TBEC, Take 20 mg by mouth daily. , Disp: , Rfl:  .  levothyroxine (SYNTHROID) 125 MCG tablet, TAKE ONE TABLET BY MOUTH EVERY MORNING, Disp: 90 tablet, Rfl: 1 .  Methylcellulose, Laxative, (CITRUCEL) 500 MG TABS, Take 500 mg by mouth daily. , Disp: , Rfl:  .  mometasone-formoterol (DULERA) 200-5 MCG/ACT  AERO, Inhale 2 puffs into the lungs in the morning and at bedtime., Disp: 13 g, Rfl: 5 .  montelukast (SINGULAIR) 10 MG tablet, TAKE ONE (1) TABLET BY MOUTH EVERY DAY BEFORE BREAKFAST, Disp: 30 tablet, Rfl: 11 .  Multiple Vitamins-Minerals (CENTRUM SILVER) tablet, Take 1 tablet by mouth daily with breakfast. , Disp: , Rfl:  .  rosuvastatin (CRESTOR) 20 MG tablet, Take 1 tablet (20 mg total) by mouth daily., Disp: 90 tablet, Rfl: 1

## 2020-05-24 ENCOUNTER — Encounter: Payer: Self-pay | Admitting: Family Medicine

## 2020-06-10 DIAGNOSIS — Z0189 Encounter for other specified special examinations: Secondary | ICD-10-CM | POA: Diagnosis not present

## 2020-06-16 DIAGNOSIS — H00011 Hordeolum externum right upper eyelid: Secondary | ICD-10-CM | POA: Diagnosis not present

## 2020-06-23 ENCOUNTER — Other Ambulatory Visit: Payer: Self-pay

## 2020-06-23 ENCOUNTER — Ambulatory Visit (HOSPITAL_BASED_OUTPATIENT_CLINIC_OR_DEPARTMENT_OTHER)
Admission: RE | Admit: 2020-06-23 | Discharge: 2020-06-23 | Disposition: A | Discharge status: Home / Self Care | Payer: BC Managed Care – PPO | Source: Ambulatory Visit | Attending: Critical Care Medicine | Admitting: Critical Care Medicine

## 2020-06-23 DIAGNOSIS — J8410 Pulmonary fibrosis, unspecified: Secondary | ICD-10-CM | POA: Diagnosis not present

## 2020-06-23 DIAGNOSIS — I2510 Atherosclerotic heart disease of native coronary artery without angina pectoris: Secondary | ICD-10-CM | POA: Diagnosis not present

## 2020-06-23 DIAGNOSIS — Z85110 Personal history of malignant carcinoid tumor of bronchus and lung: Secondary | ICD-10-CM | POA: Diagnosis not present

## 2020-06-23 DIAGNOSIS — R918 Other nonspecific abnormal finding of lung field: Secondary | ICD-10-CM | POA: Diagnosis not present

## 2020-06-23 DIAGNOSIS — I700 Atherosclerosis of aorta: Secondary | ICD-10-CM | POA: Diagnosis not present

## 2020-07-01 DIAGNOSIS — M1712 Unilateral primary osteoarthritis, left knee: Secondary | ICD-10-CM | POA: Diagnosis not present

## 2020-07-04 DIAGNOSIS — M1712 Unilateral primary osteoarthritis, left knee: Secondary | ICD-10-CM | POA: Diagnosis not present

## 2020-07-07 DIAGNOSIS — M1712 Unilateral primary osteoarthritis, left knee: Secondary | ICD-10-CM | POA: Diagnosis not present

## 2020-07-11 DIAGNOSIS — M1712 Unilateral primary osteoarthritis, left knee: Secondary | ICD-10-CM | POA: Diagnosis not present

## 2020-07-15 DIAGNOSIS — M1712 Unilateral primary osteoarthritis, left knee: Secondary | ICD-10-CM | POA: Diagnosis not present

## 2020-07-18 DIAGNOSIS — M1712 Unilateral primary osteoarthritis, left knee: Secondary | ICD-10-CM | POA: Diagnosis not present

## 2020-07-22 DIAGNOSIS — M1712 Unilateral primary osteoarthritis, left knee: Secondary | ICD-10-CM | POA: Diagnosis not present

## 2020-07-25 DIAGNOSIS — M1712 Unilateral primary osteoarthritis, left knee: Secondary | ICD-10-CM | POA: Diagnosis not present

## 2020-07-29 DIAGNOSIS — M1712 Unilateral primary osteoarthritis, left knee: Secondary | ICD-10-CM | POA: Diagnosis not present

## 2020-08-01 DIAGNOSIS — M1712 Unilateral primary osteoarthritis, left knee: Secondary | ICD-10-CM | POA: Diagnosis not present

## 2020-08-05 DIAGNOSIS — M1712 Unilateral primary osteoarthritis, left knee: Secondary | ICD-10-CM | POA: Diagnosis not present

## 2020-08-14 ENCOUNTER — Telehealth: Payer: Self-pay | Admitting: Pulmonary Disease

## 2020-08-14 NOTE — Telephone Encounter (Signed)
Spoke to patient, who is requesting results from 06/23/2020 CT?  Dr. Erin Fulling, please advise. Thanks

## 2020-08-15 NOTE — Telephone Encounter (Signed)
The CT shows no changes in the pulmonary nodules previously noted. They have been stable since 2013 so we do not need to preform any further follow up.   Thanks, Wille Glaser

## 2020-08-18 NOTE — Telephone Encounter (Signed)
Called and spoke with patient, provided CT results per Dr. Erin Fulling.  She verbalized understanding.  She was wondering why it took so long to get the results of the scan since she had it done the second week of June.  Advised that I am unsure about the delay and apologized for the delay.  I did let her know that Dr. Erin Fulling has been out of the office and he has also been working in the hospital as well.  She wanted to see about making an appointment while she was on the phone, advised that his schedule is not out for November, however, she is in for a recall and the letter for recall is scheduled to be sent out around 10/13/20 and her f/u is due around 11/12/20.  Advised she can call back in about a month to see if his schedule is out if she is worried about his schedule being full prior to her getting the letter in the mail.  She verbalized understanding.  Nothing further needed.

## 2020-08-18 NOTE — Telephone Encounter (Signed)
Patient calling to get CT scan results. Patient phone number is [phone].

## 2020-08-25 ENCOUNTER — Encounter: Payer: Self-pay | Admitting: Internal Medicine

## 2020-09-02 DIAGNOSIS — H5203 Hypermetropia, bilateral: Secondary | ICD-10-CM | POA: Diagnosis not present

## 2020-09-02 DIAGNOSIS — H2513 Age-related nuclear cataract, bilateral: Secondary | ICD-10-CM | POA: Diagnosis not present

## 2020-10-01 ENCOUNTER — Other Ambulatory Visit: Payer: Self-pay | Admitting: Family Medicine

## 2020-10-01 ENCOUNTER — Encounter: Payer: Self-pay | Admitting: Internal Medicine

## 2020-10-01 ENCOUNTER — Ambulatory Visit (AMBULATORY_SURGERY_CENTER): Payer: BC Managed Care – PPO | Admitting: *Deleted

## 2020-10-01 ENCOUNTER — Other Ambulatory Visit: Payer: Self-pay

## 2020-10-01 VITALS — Ht 68.0 in | Wt 227.0 lb

## 2020-10-01 DIAGNOSIS — Z86010 Personal history of colonic polyps: Secondary | ICD-10-CM

## 2020-10-01 DIAGNOSIS — Z0000 Encounter for general adult medical examination without abnormal findings: Secondary | ICD-10-CM

## 2020-10-01 MED ORDER — PLENVU 140 G PO SOLR: 1.0000 | Freq: Once | ORAL | 0 refills | Status: AC

## 2020-10-01 NOTE — Progress Notes (Signed)
Virtual pre-visit completed in-person.  No egg or soy allergy known to patient  No issues known to pt with past sedation with any surgeries or procedures Patient denies ever being told they had issues or difficulty with intubation  No FH of Malignant Hyperthermia Pt is not on diet pills Pt is not on  home 02  Pt is not on blood thinners  Pt denies issues with constipation  No A fib or A flutter  EMMI video to pt or via MyChart  COVID 19 guidelines implemented in [place] today with Pt and RN    Patient refused Miralax ( ineffective) and Golytely ( do the the volume)   Plenvu coupon code sent with prescription and patient given coupon to present.  Pt is fully vaccinated  for Covid    Due to the COVID-19 pandemic we are asking patients to follow certain guidelines.  Pt aware of COVID protocols and [facility] guidelines

## 2020-10-06 ENCOUNTER — Other Ambulatory Visit: Payer: Self-pay | Admitting: Family Medicine

## 2020-10-06 DIAGNOSIS — Z1231 Encounter for screening mammogram for malignant neoplasm of breast: Secondary | ICD-10-CM

## 2020-10-22 ENCOUNTER — Ambulatory Visit (AMBULATORY_SURGERY_CENTER): Payer: BC Managed Care – PPO | Admitting: Internal Medicine

## 2020-10-22 ENCOUNTER — Other Ambulatory Visit: Payer: Self-pay

## 2020-10-22 ENCOUNTER — Encounter: Payer: Self-pay | Admitting: Internal Medicine

## 2020-10-22 VITALS — BP 105/69 | HR 70 | Temp 97.5°F | Resp 13 | Ht 68.0 in | Wt 227.0 lb

## 2020-10-22 DIAGNOSIS — Z86010 Personal history of colonic polyps: Secondary | ICD-10-CM | POA: Diagnosis not present

## 2020-10-22 DIAGNOSIS — Z1211 Encounter for screening for malignant neoplasm of colon: Secondary | ICD-10-CM | POA: Diagnosis not present

## 2020-10-22 MED ORDER — SODIUM CHLORIDE 0.9 % IV SOLN: 500.0000 mL | Freq: Once | INTRAVENOUS | Status: DC

## 2020-10-22 NOTE — Progress Notes (Signed)
Pt's states no medical or surgical changes since previsit or office visit. Vs by DT.

## 2020-10-22 NOTE — Op Note (Signed)
[place] Patient Name: Debbie Miranda Procedure Date: 10/22/2020 1:43 PM MRN: [id number] Endoscopist: Gatha Mayer , MD Age: 65 Referring MD:  Date of Birth: [birth year] Gender: Female Account #: [id number] Procedure:                Colonoscopy Indications:              High risk colon cancer surveillance: Personal                            history of sessile serrated colon polyp (less than                            10 mm in size) with no dysplasia, Last colonoscopy:                            2017 Medicines:                Propofol per Anesthesia, Monitored Anesthesia Care Procedure:                Pre-Anesthesia Assessment:                           - Prior to the procedure, a History and Physical                            was performed, and patient medications and                            allergies were reviewed. The patient's tolerance of                            previous anesthesia was also reviewed. The risks                            and benefits of the procedure and the sedation                            options and risks were discussed with the patient.                            All questions were answered, and informed consent                            was obtained. Prior Anticoagulants: The patient has                            taken no previous anticoagulant or antiplatelet                            agents. ASA Grade Assessment: II - A patient with                            mild systemic disease. After reviewing the risks  and benefits, the patient was deemed in                            satisfactory condition to undergo the procedure.                           After obtaining informed consent, the colonoscope                            was passed under direct vision. Throughout the                            procedure, the patient's blood pressure, pulse, and                            oxygen saturations were  monitored continuously. The                            Olympus CF-HQ190L (Serial# [id number]) Colonoscope was                            introduced through the anus and advanced to the the                            cecum, identified by appendiceal orifice and                            ileocecal valve. The colonoscopy was performed                            without difficulty. The patient tolerated the                            procedure well. The quality of the bowel                            preparation was good. The ileocecal valve,                            appendiceal orifice, and rectum were photographed. Scope In: 2:05:28 PM Scope Out: 2:20:31 PM Scope Withdrawal Time: 0 hours 11 minutes 35 seconds  Total Procedure Duration: 0 hours 15 minutes 3 seconds  Findings:                 The perianal and digital rectal examinations were                            normal.                           Multiple diverticula were found in the sigmoid                            colon.  The exam was otherwise without abnormality on                            direct and retroflexion views. Complications:            No immediate complications. Estimated Blood Loss:     Estimated blood loss: none. Impression:               - Diverticulosis in the sigmoid colon.                           - The examination was otherwise normal on direct                            and retroflexion views.                           - No specimens collected.                           - Personal history of colonic polyp 6 mm ssp 2017. Recommendation:           - Resume previous diet.                           - Continue present medications.                           - Repeat colonoscopy in 10 years. Gatha Mayer, MD 10/22/2020 2:27:16 PM This report has been signed electronically.

## 2020-10-22 NOTE — Progress Notes (Signed)
Pt Drowsy. VSS. To PACU, report to RN. No anesthetic complications noted.  

## 2020-10-22 NOTE — Patient Instructions (Addendum)
No polyps today. Next routine colonoscopy or other screening test in 10 years - 2032.  I appreciate the opportunity to care for you. Gatha Mayer, MD, [facility]  Resume previous diet and continue current medications.  YOU HAD AN ENDOSCOPIC PROCEDURE TODAY AT [facility]:   Refer to the procedure report that was given to you for any specific questions about what was found during the examination.  If the procedure report does not answer your questions, please call your gastroenterologist to clarify.  If you requested that your care partner not be given the details of your procedure findings, then the procedure report has been included in a sealed envelope for you to review at your convenience later.  YOU SHOULD EXPECT: Some feelings of bloating in the abdomen. Passage of more gas than usual.  Walking can help get rid of the air that was put into your GI tract during the procedure and reduce the bloating. If you had a lower endoscopy (such as a colonoscopy or flexible sigmoidoscopy) you may notice spotting of blood in your stool or on the toilet paper. If you underwent a bowel prep for your procedure, you may not have a normal bowel movement for a few days.  Please Note:  You might notice some irritation and congestion in your nose or some drainage.  This is from the oxygen used during your procedure.  There is no need for concern and it should clear up in a day or so.  SYMPTOMS TO REPORT IMMEDIATELY:  Following lower endoscopy (colonoscopy or flexible sigmoidoscopy):  Excessive amounts of blood in the stool  Significant tenderness or worsening of abdominal pains  Swelling of the abdomen that is new, acute  Fever of 100F or higher  For urgent or emergent issues, a gastroenterologist can be reached at any hour by calling [phone]. Do not use MyChart messaging for urgent concerns.    DIET:  We do recommend a small meal at first, but then you may proceed to your regular  diet.  Drink plenty of fluids but you should avoid alcoholic beverages for 24 hours.  ACTIVITY:  You should plan to take it easy for the rest of today and you should NOT DRIVE or use heavy machinery until tomorrow (because of the sedation medicines used during the test).    FOLLOW UP: Our staff will call the number listed on your records 48-72 hours following your procedure to check on you and address any questions or concerns that you may have regarding the information given to you following your procedure. If we do not reach you, we will leave a message.  We will attempt to reach you two times.  During this call, we will ask if you have developed any symptoms of COVID 19. If you develop any symptoms (ie: fever, flu-like symptoms, shortness of breath, cough etc.) before then, please call [phone].  If you test positive for Covid 19 in the 2 weeks post procedure, please call and report this information to [place].    If any biopsies were taken you will be contacted by phone or by letter within the next 1-3 weeks.  Please call us at [phone] if you have not heard about the biopsies in 3 weeks.    SIGNATURES/CONFIDENTIALITY: You and/or your care partner have signed paperwork which will be entered into your electronic medical record.  These signatures attest to the fact that that the information above on your After Visit Summary has been reviewed and  is understood.  Full responsibility of the confidentiality of this discharge information lies with you and/or your care-partner.

## 2020-10-22 NOTE — Progress Notes (Signed)
[facility] History and Physical   Primary Care Physician:  Kathyrn Drown, MD   Reason for Procedure:   Hx colon polyps  Plan:    colonoscopy     HPI: Debbie Miranda is a 65 y.o. female here for surveillance colonoscopy Wt Readings from Last 3 Encounters:  10/22/20 227 lb (103 kg)  10/01/20 227 lb (103 kg)  05/20/20 245 lb (111.1 kg)     Past Medical History:  Diagnosis Date   Acute ischemic colitis ([place]) 08/09/2018   Anal itching 10/27/2011   Arthritis    Cancer ([place])    lung-7years ago   Carcinoid tumor of lung 2013   Left   Cataract    beginning stages,bilateral   Contact lens/glasses fitting    wares contacts or glasses   Corn of toe 5th right 08/16/2012   Costochondritis    Diverticulitis 2012   Dysplastic nevi    Esophageal reflux    External hemorrhoids without mention of complication 3557   GERD with stricture 05/16/2012   Headache(784.0)    HTN (hypertension) 08/09/2018   Hyperlipidemia    Hypertension    pt denies   Hypothyroidism    IFG (impaired fasting glucose)    Impaired fasting glucose 05/16/2012   Mycotic toenails 12/10/2010   1st toenail right-lasered nail 12/10/2010-present (last on 06/15/2012)   Neuropathic pain 06/30/2016   Obesity    Personal history of colonic polyps 07/30/2015   SSA   PONV (postoperative nausea and vomiting)    Positive H. pylori test    Thyroid nodule 05/16/2012   Benign, 2013, [place]    Urge incontinence 07/05/2018   Venous insufficiency of left leg 03/29/2016    Past Surgical History:  Procedure Laterality Date   ABDOMINAL HYSTERECTOMY     APPENDECTOMY     COLONOSCOPY     ESOPHAGOGASTRODUODENOSCOPY     EXCISION HAGLUND'S DEFORMITY WITH ACHILLES TENDON REPAIR Right 06/07/2013   Procedure: RIGHT ACHILLES DEBRIDEMENT; HAGLUND EXCISION;  Surgeon: Wylene Simmer, MD;  Location: [place];  Service: Orthopedics;  Laterality: Right;   EXCISION OF SKIN TAG  11/18/2011   Procedure: EXCISION  OF SKIN TAG;  Surgeon: Leighton Ruff, MD;  Location: [facility];  Service: General;  Laterality: N/A;  posterior   GASTROCNEMIUS RECESSION Right 06/07/2013   Procedure: GASTROCNEMIUS RECESSION;  Surgeon: Wylene Simmer, MD;  Location: [place];  Service: Orthopedics;  Laterality: Right;   HEMORRHOID BANDING     HEMORRHOID SURGERY  11/18/2011   Procedure: HEMORRHOIDECTOMY;  Surgeon: Leighton Ruff, MD;  Location: [facility];  Service: General;;   LEFT OOPHORECTOMY     LUNG LOBECTOMY  12/2011   [place] removed for carcinoid   PLANTAR FASCIA SURGERY     rectal fissure repair     REPLACEMENT TOTAL KNEE BILATERAL  2022   THYROID SURGERY     nodule and partial thyroidectomy   TONSILLECTOMY      Prior to Admission medications   Medication Sig Start Date End Date Taking? Authorizing Provider  Biotin 5000 MCG CAPS Take 5,000 mcg by mouth daily with breakfast.   Yes [provider]  Cholecalciferol (VITAMIN D-3) 25 MCG (1000 UT) CAPS Take 1,000 Units by mouth daily with breakfast.    Yes [provider]  cyclobenzaprine (FLEXERIL) 5 MG tablet 1 to 2 qhs prn insomnia or back spasms 05/13/20  Yes Luking, Scott A, MD  Esomeprazole Magnesium (NEXIUM 24HR) 20 MG  TBEC Take 20 mg by mouth daily.   Yes [provider]  levothyroxine (SYNTHROID) 125 MCG tablet TAKE ONE TABLET BY MOUTH EVERY MORNING 10/01/20  Yes Luking, Elayne Snare, MD  Multiple Vitamins-Minerals (CENTRUM SILVER) tablet Take 1 tablet by mouth daily with breakfast.    Yes [provider]  rosuvastatin (CRESTOR) 20 MG tablet Take 1 tablet (20 mg total) by mouth daily. 05/13/20  Yes Luking, Elayne Snare, MD  Methylcellulose, Laxative, (CITRUCEL) 500 MG TABS Take 500 mg by mouth daily.     [provider]  montelukast (SINGULAIR) 10 MG tablet TAKE ONE (1) TABLET BY MOUTH EVERY DAY BEFORE BREAKFAST Patient not taking: No sig reported 04/07/20   Freddi Starr, MD    Current Outpatient Medications  Medication Sig Dispense Refill   Biotin 5000 MCG CAPS Take 5,000 mcg by mouth daily with breakfast.     Cholecalciferol (VITAMIN D-3) 25 MCG (1000 UT) CAPS Take 1,000 Units by mouth daily with breakfast.      cyclobenzaprine (FLEXERIL) 5 MG tablet 1 to 2 qhs prn insomnia or back spasms 40 tablet 6   Esomeprazole Magnesium (NEXIUM 24HR) 20 MG TBEC Take 20 mg by mouth daily.     levothyroxine (SYNTHROID) 125 MCG tablet TAKE ONE TABLET BY MOUTH EVERY MORNING 90 tablet 0   Multiple Vitamins-Minerals (CENTRUM SILVER) tablet Take 1 tablet by mouth daily with breakfast.      rosuvastatin (CRESTOR) 20 MG tablet Take 1 tablet (20 mg total) by mouth daily. 90 tablet 1   Methylcellulose, Laxative, (CITRUCEL) 500 MG TABS Take 500 mg by mouth daily.      montelukast (SINGULAIR) 10 MG tablet TAKE ONE (1) TABLET BY MOUTH EVERY DAY BEFORE BREAKFAST (Patient not taking: No sig reported) 30 tablet 11   Current Facility-Administered Medications  Medication Dose Route Frequency Provider Last Rate Last Admin   0.9 %  sodium chloride infusion  500 mL Intravenous Once Gatha Mayer, MD        Allergies as of 10/22/2020 - Review Complete 10/22/2020  Allergen Reaction Noted   Codeine Nausea And Vomiting 12/20/2011   Rivaroxaban Rash 10/01/2020    Family History  Problem Relation Age of Onset   Kidney cancer Father    Pulmonary embolism Mother    Breast cancer Maternal Grandmother    Cancer Maternal Grandmother        breast   Heart disease Maternal Grandfather    Colon cancer Neg Hx    Colon polyps Neg Hx    Esophageal cancer Neg Hx    Rectal cancer Neg Hx    Stomach cancer Neg Hx     Social History   Socioeconomic History   Marital status: Married    Spouse name: Not on file   Number of children: 1   Years of education: Not on file   Highest education level: Not on file  Occupational History   Occupation: homemaker  Tobacco Use   Smoking  status: Never   Smokeless tobacco: Never  Vaping Use   Vaping Use: Never used  Substance and Sexual Activity   Alcohol use: Yes    Alcohol/week: 0.0 standard drinks    Comment: wine-occ   Drug use: No   Sexual activity: Yes    Birth control/protection: Surgical        Social History Narrative   She is married, she is a homemaker, she has 1 son who is a Sport and exercise psychologist, he is an Engineer, mining  in [place]   Very occasional alcohol never smoker no tobacco no drug use    Review of Systems:  All other review of systems negative except as mentioned in the HPI.  Physical Exam: Vital signs BP 122/68   Pulse 80   Temp (!) 97.5 F (36.4 C)   Ht 5\' 8"  (1.727 m)   Wt 227 lb (103 kg)   SpO2 99%   BMI 34.52 kg/m   General:   Alert,  Well-developed, well-nourished, pleasant and cooperative in NAD Lungs:  Clear throughout to auscultation.   Heart:  Regular rate and rhythm; no murmurs, clicks, rubs,  or gallops. Abdomen:  Soft, nontender and nondistended. Normal bowel sounds.   Neuro/Psych:  Alert and cooperative. Normal mood and affect. A and O x 3   @Khori Rosevear  Simonne Maffucci, MD, [facility] [phone] (pager) 10/22/2020 1:51 PM@

## 2020-10-24 ENCOUNTER — Telehealth: Payer: Self-pay | Admitting: *Deleted

## 2020-10-24 NOTE — Telephone Encounter (Signed)
  Follow up Call-  Call back number 10/22/2020 09/05/2018  Post procedure Call Back phone  # [phone]  Permission to leave phone message Yes Yes  Some recent data might be hidden     Patient questions:  Do you have a fever, pain , or abdominal swelling? No. Pain Score  0 *  Have you tolerated food without any problems? Yes.    Have you been able to return to your normal activities? Yes.    Do you have any questions about your discharge instructions: Diet   No. Medications  No. Follow up visit  No.  Do you have questions or concerns about your Care? No.  Actions: * If pain score is 4 or above: No action needed, pain <4.  Have you developed a fever since your procedure? no  2.   Have you had an respiratory symptoms (SOB or cough) since your procedure? no  3.   Have you tested positive for COVID 19 since your procedure no  4.   Have you had any family members/close contacts diagnosed with the COVID 19 since your procedure?  no   If yes to any of these questions please route to Joylene John, RN and Joella Prince, RN

## 2020-10-28 DIAGNOSIS — H903 Sensorineural hearing loss, bilateral: Secondary | ICD-10-CM | POA: Diagnosis not present

## 2020-10-28 DIAGNOSIS — H9113 Presbycusis, bilateral: Secondary | ICD-10-CM | POA: Diagnosis not present

## 2020-11-03 ENCOUNTER — Other Ambulatory Visit: Payer: Self-pay | Admitting: Family Medicine

## 2020-11-19 ENCOUNTER — Ambulatory Visit (INDEPENDENT_AMBULATORY_CARE_PROVIDER_SITE_OTHER): Payer: BC Managed Care – PPO | Admitting: Pulmonary Disease

## 2020-11-19 ENCOUNTER — Encounter: Payer: Self-pay | Admitting: Pulmonary Disease

## 2020-11-19 ENCOUNTER — Other Ambulatory Visit: Payer: Self-pay

## 2020-11-19 VITALS — BP 118/84 | HR 67 | Ht 68.0 in | Wt 221.8 lb

## 2020-11-19 DIAGNOSIS — J4520 Mild intermittent asthma, uncomplicated: Secondary | ICD-10-CM | POA: Diagnosis not present

## 2020-11-19 NOTE — Progress Notes (Signed)
Synopsis: Referred in March 2021 for dyspnea on exertion by Debbie Drown, MD.   Subjective:   PATIENT ID: Debbie Miranda GENDER: female DOB: 01-10-1956, MRN: [id number]   HPI  Chief Complaint  Patient presents with   Follow-up    6 mo f/u for asthma. States her breathing has been doing well since last visit.    Debbie Miranda is a 65 year old woman, never smoker with history of pulmonary carcinoid s/p lobectomy who returns to pulmonary clinic for follow up of mild intermittent asthma.   She has been tapered off dulera inahler therapy without return or increase in cough, wheezing or dyspnea. She has also stopped using montelukast without an increase in her symptoms.   She has only noticed wheezing with significant exertion such as power walking, but this is a rare occurrence that she is exerting herself like this.   CT Chest from 06/2020 shows stable bilateral pulmonary nodules measuring 3-39mm in size. These nodules have been stable since 2013.  She has left lower extremity edema since her knee replacement surgery.   Past Medical History:  Diagnosis Date   Acute ischemic colitis ([place]) 08/09/2018   Anal itching 10/27/2011   Arthritis    Cancer ([place])    lung-7years ago   Carcinoid tumor of lung 2013   Left   Cataract    beginning stages,bilateral   Contact lens/glasses fitting    wares contacts or glasses   Corn of toe 5th right 08/16/2012   Costochondritis    Diverticulitis 2012   Dysplastic nevi    Esophageal reflux    External hemorrhoids without mention of complication 9163   GERD with stricture 05/16/2012   Headache(784.0)    HTN (hypertension) 08/09/2018   Hyperlipidemia    Hypertension    pt denies   Hypothyroidism    IFG (impaired fasting glucose)    Impaired fasting glucose 05/16/2012   Mycotic toenails 12/10/2010   1st toenail right-lasered nail 12/10/2010-present (last on 06/15/2012)   Neuropathic pain 06/30/2016   Obesity    Personal history of colonic polyps  07/30/2015   SSA   PONV (postoperative nausea and vomiting)    Positive H. pylori test    Thyroid nodule 05/16/2012   Benign, 2013, [place]    Urge incontinence 07/05/2018   Venous insufficiency of left leg 03/29/2016     Family History  Problem Relation Age of Onset   Kidney cancer Father    Pulmonary embolism Mother    Breast cancer Maternal Grandmother    Cancer Maternal Grandmother        breast   Heart disease Maternal Grandfather    Colon cancer Neg Hx    Colon polyps Neg Hx    Esophageal cancer Neg Hx    Rectal cancer Neg Hx    Stomach cancer Neg Hx      Social History   Socioeconomic History   Marital status: Married    Spouse name: Not on file   Number of children: 1   Years of education: Not on file   Highest education level: Not on file  Occupational History   Occupation: homemaker  Tobacco Use   Smoking status: Never   Smokeless tobacco: Never  Vaping Use   Vaping Use: Never used  Substance and Sexual Activity   Alcohol use: Yes    Alcohol/week: 0.0 standard drinks    Comment: wine-occ   Drug use: No   Sexual activity: Yes    Birth control/protection:  Surgical  Other Topics Concern   Not on file  Social History Narrative   She is married, she is a homemaker, she has 1 son who is a Sport and exercise psychologist, he is an Engineer, mining in [place]   Very occasional alcohol never smoker no tobacco no drug use   Social Determinants of Radio broadcast assistant Strain: Not on file  Food Insecurity: Not on file  Transportation Needs: Not on file  Physical Activity: Not on file  Stress: Not on file  Social Connections: Not on file  Intimate Partner Violence: Not on file     Allergies  Allergen Reactions   Codeine Nausea And Vomiting   Rivaroxaban Rash     Outpatient Medications Prior to Visit  Medication Sig Dispense Refill   Biotin 5000 MCG CAPS Take 5,000 mcg by mouth daily with breakfast.     Cholecalciferol (VITAMIN D-3)  25 MCG (1000 UT) CAPS Take 1,000 Units by mouth daily with breakfast.      cyclobenzaprine (FLEXERIL) 5 MG tablet 1 to 2 qhs prn insomnia or back spasms 40 tablet 6   Esomeprazole Magnesium (NEXIUM 24HR) 20 MG TBEC Take 20 mg by mouth daily.     levothyroxine (SYNTHROID) 125 MCG tablet TAKE ONE TABLET BY MOUTH EVERY MORNING 90 tablet 0   Methylcellulose, Laxative, (CITRUCEL) 500 MG TABS Take 500 mg by mouth daily.      Multiple Vitamins-Minerals (CENTRUM SILVER) tablet Take 1 tablet by mouth daily with breakfast.      rosuvastatin (CRESTOR) 20 MG tablet TAKE ONE (1) TABLET BY MOUTH EVERY DAY 90 tablet 1   montelukast (SINGULAIR) 10 MG tablet TAKE ONE (1) TABLET BY MOUTH EVERY DAY BEFORE BREAKFAST (Patient not taking: No sig reported) 30 tablet 11   No facility-administered medications prior to visit.    Review of Systems  Constitutional:  Negative for chills, diaphoresis, fever, malaise/fatigue and weight loss.  HENT:  Negative for congestion, sinus pain and sore throat.   Eyes: Negative.   Respiratory:  Negative for cough, hemoptysis, sputum production, shortness of breath and wheezing.   Cardiovascular:  Negative for chest pain, palpitations, orthopnea and leg swelling.  Gastrointestinal:  Negative for abdominal pain, diarrhea, heartburn, nausea and vomiting.  Genitourinary: Negative.   Musculoskeletal: Negative.   Skin:  Negative for rash.  Neurological: Negative.   Endo/Heme/Allergies: Negative.   Psychiatric/Behavioral: Negative.     Objective:   Vitals:   11/19/20 1333  BP: 118/84  Pulse: 67  SpO2: 98%  Weight: 221 lb 12.8 oz (100.6 kg)  Height: 5\' 8"  (1.727 m)   Physical Exam Constitutional:      General: She is not in acute distress.    Appearance: Normal appearance. She is obese.  HENT:     Head: Normocephalic and atraumatic.  Eyes:     General: No scleral icterus.    Conjunctiva/sclera: Conjunctivae normal.     Pupils: Pupils are equal, round, and reactive to  light.  Cardiovascular:     Rate and Rhythm: Normal rate and regular rhythm.     Pulses: Normal pulses.     Heart sounds: Normal heart sounds. No murmur heard. Pulmonary:     Effort: Pulmonary effort is normal.     Breath sounds: Normal breath sounds. No wheezing.  Musculoskeletal:     Right lower leg: No edema.     Left lower leg: Edema present.  Skin:    General: Skin is warm and dry.  Capillary Refill: Capillary refill takes less than 2 seconds.  Neurological:     General: No focal deficit present.     Mental Status: She is alert.   CBC    Component Value Date/Time   WBC 5.6 05/13/2020 1012   WBC 11.9 (H) 08/10/2018 0829   RBC 4.72 05/13/2020 1012   RBC 4.41 08/10/2018 0829   HGB 15.1 05/13/2020 1012   HCT 44.5 05/13/2020 1012   PLT 216 05/13/2020 1012   MCV 94 05/13/2020 1012   MCH 32.0 05/13/2020 1012   MCH 32.0 08/10/2018 0829   MCHC 33.9 05/13/2020 1012   MCHC 32.9 08/10/2018 0829   RDW 12.5 05/13/2020 1012   LYMPHSABS 1.7 05/13/2020 1012   MONOABS 0.4 07/16/2013 0705   EOSABS 0.0 05/13/2020 1012   BASOSABS 0.0 05/13/2020 1012   Chest imaging: CXR, 2 view 03/12/2019- rounded density on lateral view anterior to carina, possibly prominent pulmonary artery, similar in appearance to CXR in 2012.  Excessive lucency in the posterior lower hemithorax.   CT chest 2013-left lower lobe nodule 1.4 x 1.0 cm, subpleural nodule in the right.  Subcentimeter left upper lobe nodule.  Well-circumscribed density in the mediastinum just anterior to the carina.   Reports from CT scans at [facility], most recently 2018 reviewed-postoperative changes from left lower lobectomy without evidence of recurrent disease.  Scattered pulmonary nodules-LUL 3 mm stable, LUL 8 mm stable, RLL 3 mm stable.  Other scattered nodules not further described.   CT chest 03/28/2019- right upper lobe 8 x 25mm subpleural nodule in the anterior apex.  Postsurgical changes of the left lower lobectomy.  Left  upper lobe nodule, minimally changed from 2013.  Right lower lobe calcified granuloma and small subpleural nodule, stable.   CT chest 06/21/2019- persistent subpleural anterior RUL opacity and lobulated LUL-with stable.  Scattered groundglass opacities bilaterally.  No significant mediastinal or hilar adenopathy.  CT Chest 06/23/20 1. Unchanged bilateral pulmonary nodules, measuring up to 7 mm in the left upper lobe. These are unchanged dating back to 2013. No further follow-up is required. 2. Prior left lower lobectomy. 3. Aortic Atherosclerosis  PFT: PFT Results Latest Ref Rng & Units 04/05/2019  FVC-Pre L 2.38  FVC-Predicted Pre % 63  FVC-Post L 2.55  FVC-Predicted Post % 68  Pre FEV1/FVC % % 73  Post FEV1/FCV % % 76  FEV1-Pre L 1.73  FEV1-Predicted Pre % 60  FEV1-Post L 1.92  DLCO uncorrected ml/min/mmHg 25.19  DLCO UNC% % 110  DLCO corrected ml/min/mmHg 25.19  DLCO COR %Predicted % 110  DLVA Predicted % 146  TLC L 4.23  TLC % Predicted % 74  RV % Predicted % 76   Echo: 04/09/19  1. Normal LV systolic function; mild LVH: grade 1 diastolic dysfunction.   2. Left ventricular ejection fraction, by estimation, is 60 to 65%. The  left ventricle has normal function. The left ventricle has no regional  wall motion abnormalities. There is mild left ventricular hypertrophy.  Left ventricular diastolic parameters  are consistent with Grade I diastolic dysfunction (impaired relaxation).  Elevated left atrial pressure.   3. Right ventricular systolic function is normal. The right ventricular  size is normal.   4. The mitral valve is normal in structure. Trivial mitral valve  regurgitation. No evidence of mitral stenosis.   5. The aortic valve has an indeterminant number of cusps. Aortic valve  regurgitation is not visualized. No aortic stenosis is present.   6. The inferior vena cava  is normal in size with greater than 50%  respiratory variability, suggesting right atrial pressure  of 3 mmHg.  Assessment & Plan:   Mild intermittent asthma without complication  Discussion: Berta Denson is a 65 year old woman, never smoker with history of pulmonary carcinoid s/p lobectomy who returns to pulmonary clinic for follow up of mild intermittent asthma.   She has done well with tapering off dulera and montelukast. She can use her dulera inhaler or albuterol inhaler as needed in the future.   Her pulmonary nodules remain stable since 2013. No further follow up needed.   She can follow up as needed.   Freda Jackson, MD  Pulmonary & Critical Care Office: [phone]     Current Outpatient Medications:    Biotin 5000 MCG CAPS, Take 5,000 mcg by mouth daily with breakfast., Disp: , Rfl:    Cholecalciferol (VITAMIN D-3) 25 MCG (1000 UT) CAPS, Take 1,000 Units by mouth daily with breakfast. , Disp: , Rfl:    cyclobenzaprine (FLEXERIL) 5 MG tablet, 1 to 2 qhs prn insomnia or back spasms, Disp: 40 tablet, Rfl: 6   Esomeprazole Magnesium (NEXIUM 24HR) 20 MG TBEC, Take 20 mg by mouth daily., Disp: , Rfl:    levothyroxine (SYNTHROID) 125 MCG tablet, TAKE ONE TABLET BY MOUTH EVERY MORNING, Disp: 90 tablet, Rfl: 0   Methylcellulose, Laxative, (CITRUCEL) 500 MG TABS, Take 500 mg by mouth daily. , Disp: , Rfl:    Multiple Vitamins-Minerals (CENTRUM SILVER) tablet, Take 1 tablet by mouth daily with breakfast. , Disp: , Rfl:    rosuvastatin (CRESTOR) 20 MG tablet, TAKE ONE (1) TABLET BY MOUTH EVERY DAY, Disp: 90 tablet, Rfl: 1

## 2020-11-19 NOTE — Patient Instructions (Signed)
You can use Dulera or albuterol as needed for wheezing  The pulmonary nodules of the right and left lung have been stable since 2013. There is no further follow up needed for these nodules.   Please call our office if you develop any changes in your breathing.

## 2020-11-20 ENCOUNTER — Encounter: Payer: Self-pay | Admitting: Family Medicine

## 2020-11-20 ENCOUNTER — Ambulatory Visit (INDEPENDENT_AMBULATORY_CARE_PROVIDER_SITE_OTHER): Payer: BC Managed Care – PPO | Admitting: Family Medicine

## 2020-11-20 VITALS — BP 128/78 | Temp 96.6°F | Ht 66.25 in | Wt 219.4 lb

## 2020-11-20 DIAGNOSIS — Z0000 Encounter for general adult medical examination without abnormal findings: Secondary | ICD-10-CM

## 2020-11-20 DIAGNOSIS — E785 Hyperlipidemia, unspecified: Secondary | ICD-10-CM | POA: Diagnosis not present

## 2020-11-20 DIAGNOSIS — E039 Hypothyroidism, unspecified: Secondary | ICD-10-CM | POA: Diagnosis not present

## 2020-11-20 DIAGNOSIS — I872 Venous insufficiency (chronic) (peripheral): Secondary | ICD-10-CM | POA: Diagnosis not present

## 2020-11-20 MED ORDER — LEVOTHYROXINE SODIUM 125 MCG PO TABS: 125.0000 ug | ORAL_TABLET | Freq: Every morning | ORAL | 1 refills | Status: DC

## 2020-11-20 MED ORDER — SCOPOLAMINE 1 MG/3DAYS TD PT72: MEDICATED_PATCH | TRANSDERMAL | 0 refills | Status: DC

## 2020-11-20 MED ORDER — CYCLOBENZAPRINE HCL 5 MG PO TABS: ORAL_TABLET | ORAL | 6 refills | Status: DC
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
## 2020-11-21 LAB — BASIC METABOLIC PANEL
BUN/Creatinine Ratio: 18 (ref 12–28)
BUN: 12 mg/dL (ref 8–27)
CO2: 26 mmol/L (ref 20–29)
Calcium: 9.5 mg/dL (ref 8.7–10.3)
Chloride: 100 mmol/L (ref 96–106)
Creatinine, Ser: 0.65 mg/dL (ref 0.57–1.00)
Glucose: 92 mg/dL (ref 70–99)
Potassium: 4 mmol/L (ref 3.5–5.2)
Sodium: 140 mmol/L (ref 134–144)
eGFR: 98 mL/min/{1.73_m2} (ref >59)

## 2020-11-21 LAB — LIPID PANEL
Chol/HDL Ratio: 2.7 ratio (ref 0.0–4.4)
Cholesterol, Total: 167 mg/dL (ref 100–199)
HDL: 62 mg/dL (ref >39)
LDL Chol Calc (NIH): 88 mg/dL (ref 0–99)
Triglycerides: 90 mg/dL (ref 0–149)
VLDL Cholesterol Cal: 17 mg/dL (ref 5–40)

## 2020-11-21 LAB — TSH: TSH: 1.7 u[IU]/mL (ref 0.450–4.500)

## 2020-11-24 ENCOUNTER — Other Ambulatory Visit: Payer: Self-pay | Admitting: Family Medicine

## 2020-11-24 DIAGNOSIS — E785 Hyperlipidemia, unspecified: Secondary | ICD-10-CM

## 2020-11-24 DIAGNOSIS — Z79899 Other long term (current) drug therapy: Secondary | ICD-10-CM

## 2020-11-24 MED ORDER — ROSUVASTATIN CALCIUM 40 MG PO TABS: ORAL_TABLET | ORAL | 1 refills | Status: DC

## 2020-12-08 ENCOUNTER — Other Ambulatory Visit: Payer: Self-pay | Admitting: Family Medicine

## 2021-01-13 ENCOUNTER — Ambulatory Visit
Admission: RE | Admit: 2021-01-13 | Discharge: 2021-01-13 | Disposition: A | Discharge status: Home / Self Care | Payer: Medicare Other | Source: Ambulatory Visit | Attending: Family Medicine | Admitting: Family Medicine

## 2021-01-13 DIAGNOSIS — Z1231 Encounter for screening mammogram for malignant neoplasm of breast: Secondary | ICD-10-CM

## 2021-01-19 ENCOUNTER — Telehealth: Payer: Self-pay | Admitting: Family Medicine

## 2021-01-19 NOTE — Telephone Encounter (Signed)
Patient is needing a prescription for PPSV 23 sent to her [facility] in [place].

## 2021-01-22 ENCOUNTER — Telehealth: Payer: Self-pay

## 2021-01-22 LAB — LIPID PANEL
Chol/HDL Ratio: 2.5 ratio (ref 0.0–4.4)
Cholesterol, Total: 185 mg/dL (ref 100–199)
HDL: 74 mg/dL (ref >39)
LDL Chol Calc (NIH): 94 mg/dL (ref 0–99)
Triglycerides: 96 mg/dL (ref 0–149)
VLDL Cholesterol Cal: 17 mg/dL (ref 5–40)

## 2021-01-22 LAB — HEPATIC FUNCTION PANEL
ALT: 13 IU/L (ref 0–32)
AST: 19 IU/L (ref 0–40)
Albumin: 4.5 g/dL (ref 3.8–4.8)
Alkaline Phosphatase: 78 IU/L (ref 44–121)
Bilirubin Total: <0.2 mg/dL (ref 0.0–1.2)
Bilirubin, Direct: <0.1 mg/dL (ref 0.00–0.40)
Total Protein: 6.2 g/dL (ref 6.0–8.5)

## 2021-01-22 NOTE — Telephone Encounter (Signed)
See other message thank you

## 2021-01-22 NOTE — Telephone Encounter (Signed)
Please write out I will sign then please fax

## 2021-01-22 NOTE — Telephone Encounter (Signed)
Script faxed to pharmacy

## 2021-01-22 NOTE — Telephone Encounter (Signed)
This was signed and given back to the nurses thank you

## 2021-01-22 NOTE — Telephone Encounter (Signed)
Patient is needing a prescription for PPSV 23 sent to her [facility] in [facility] Please advise

## 2021-01-22 NOTE — Telephone Encounter (Signed)
Script ready for signature

## 2021-01-23 MED ORDER — EZETIMIBE 10 MG PO TABS: 10.0000 mg | ORAL_TABLET | Freq: Every day | ORAL | 1 refills | Status: DC

## 2021-01-27 IMAGING — MG DIGITAL SCREENING BILAT W/ TOMO W/ CAD
8 series · 8 of 24 positions shown · non-contrast
Comparison: Previous exam(s).

CLINICAL DATA: Screening.

EXAM:
DIGITAL SCREENING BILATERAL MAMMOGRAM WITH TOMO AND CAD

[R CC synth-2D]
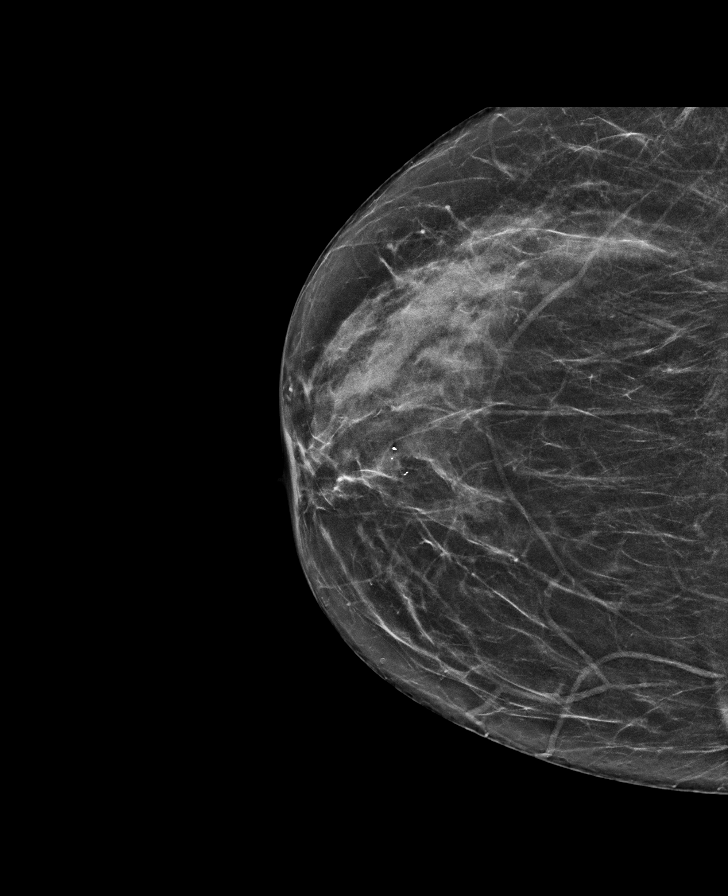

[R MLO synth-2D]
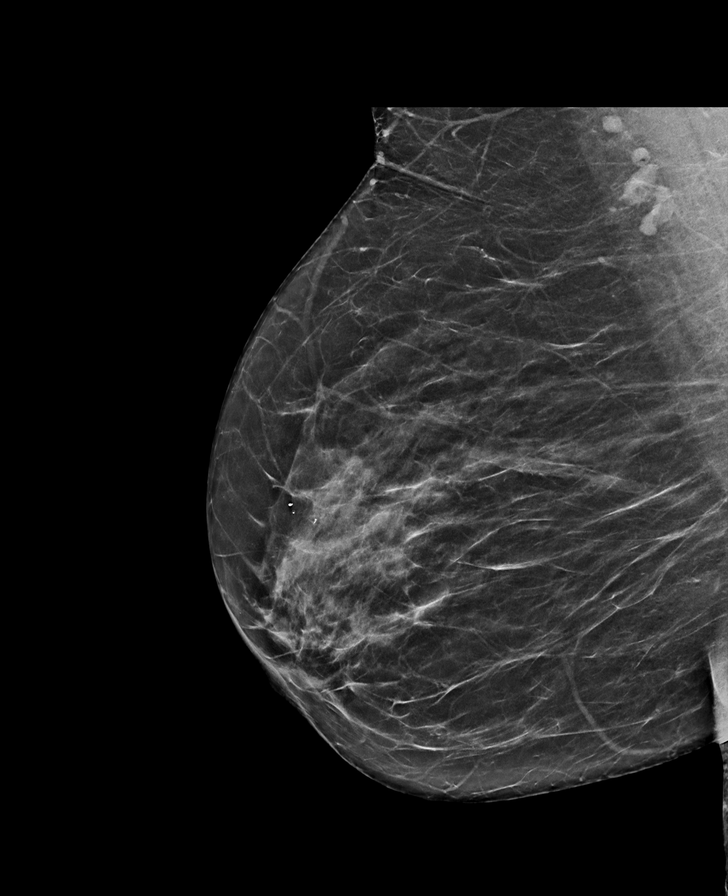

[L MLO synth-2D]
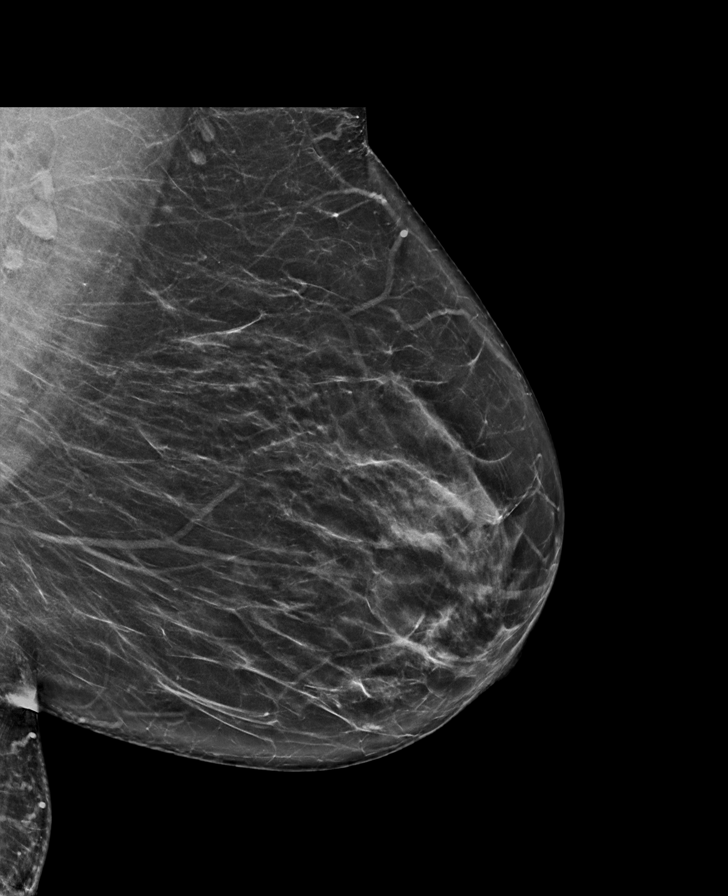

[L CC synth-2D]
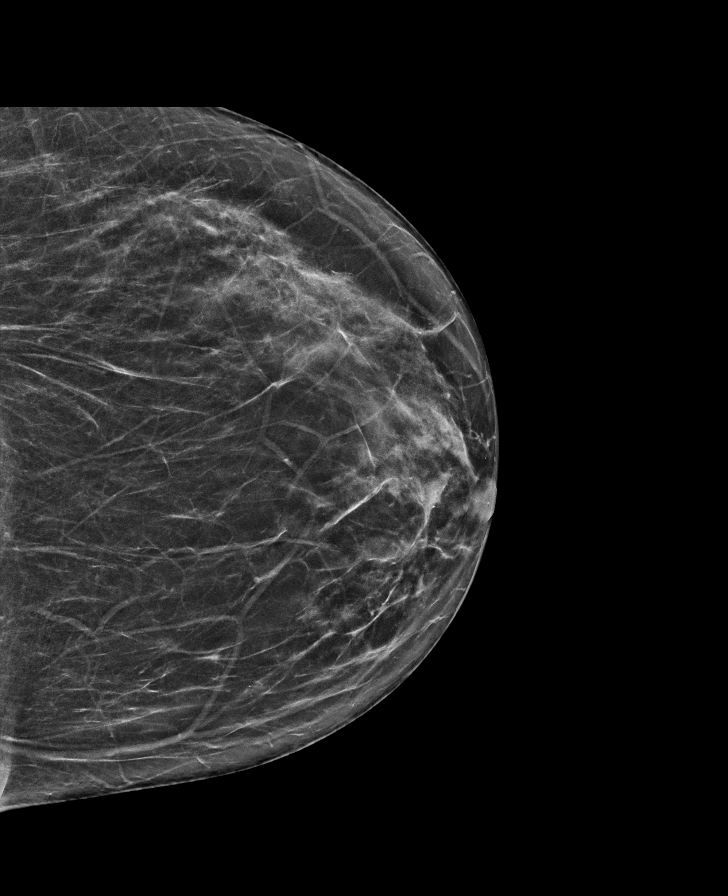

[L MLO tomo · tomo slice 37/73.0]
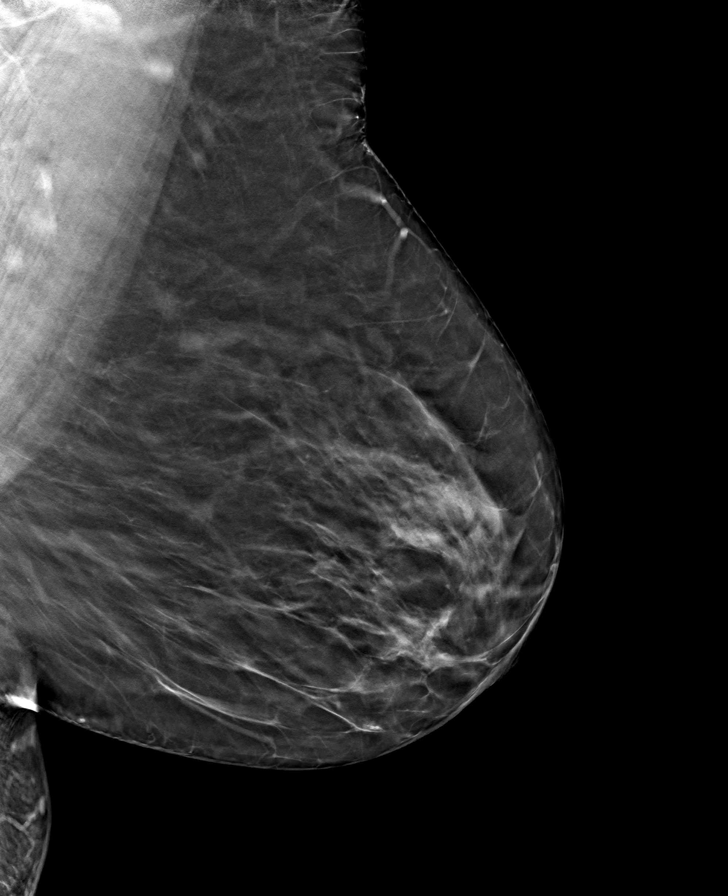

[R MLO tomo · tomo slice 35/70.0]
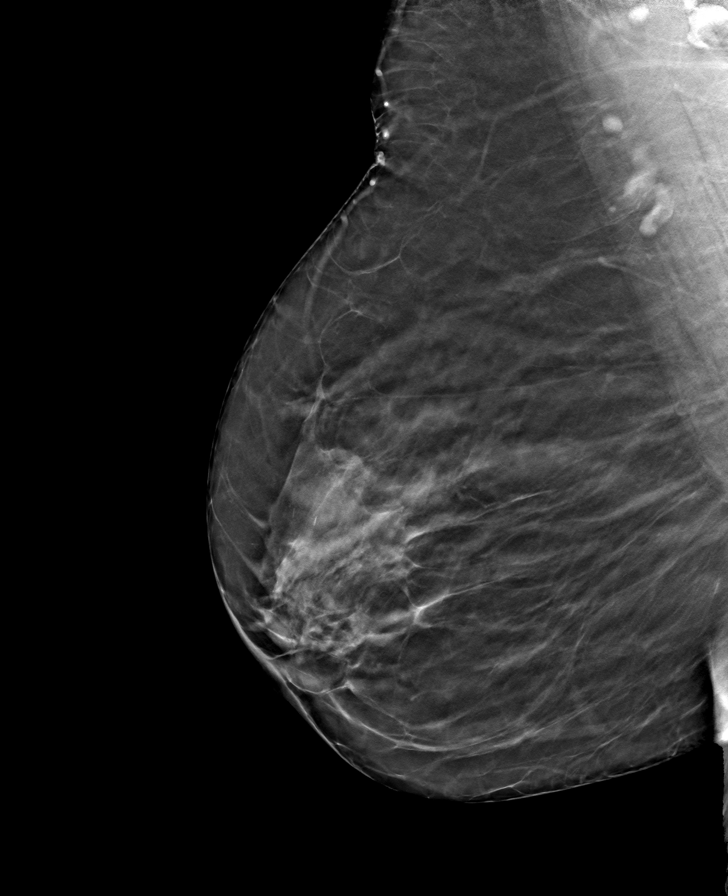

[L CC tomo · tomo slice 31/62.0]
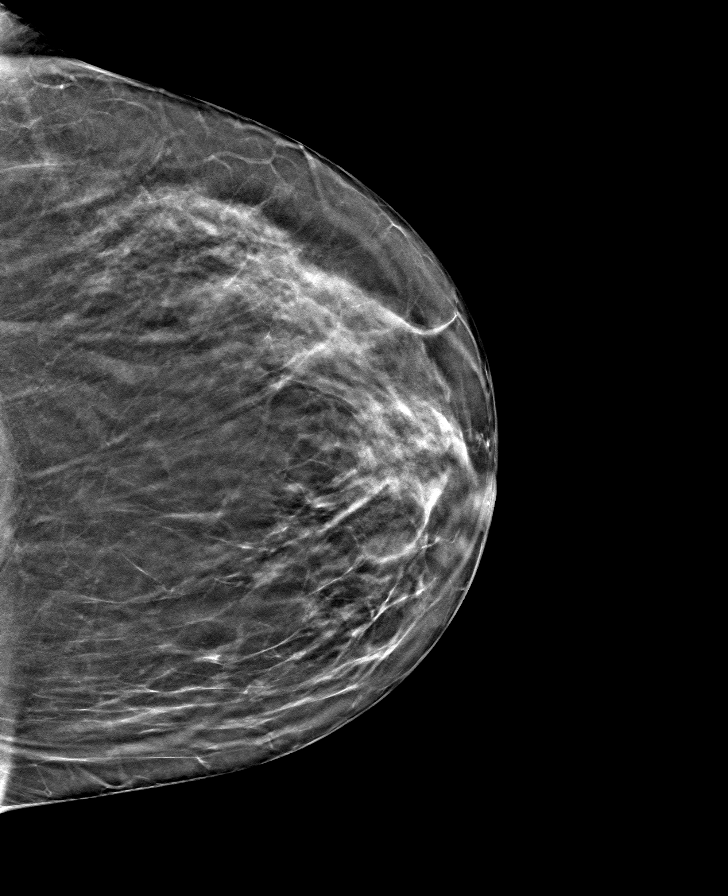

[R CC tomo · tomo slice 31/60.0]
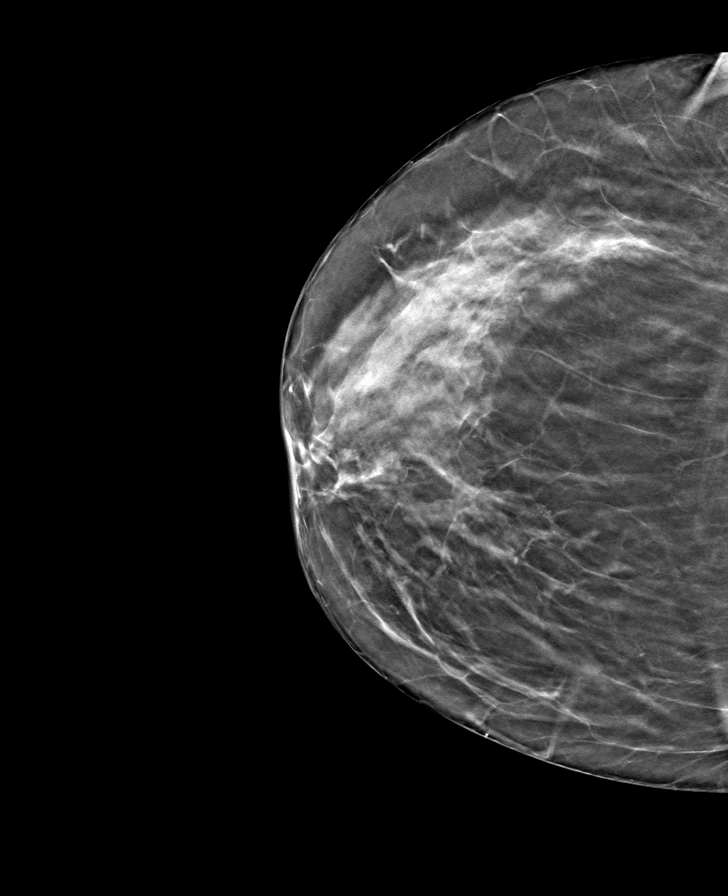

[8 of 24 positions shown; findings below may reference images not displayed]

ACR Breast Density Category c: The breast tissue is heterogeneously
dense, which may obscure small masses.
FINDINGS: There are no findings suspicious for malignancy. Images were
processed with CAD.
IMPRESSION: No mammographic evidence of malignancy. A result letter of this
screening mammogram will be mailed directly to the patient.

RECOMMENDATION:
Screening mammogram in one year. (Code:[id number]-[id number])

BI-RADS CATEGORY  1: Negative.

## 2021-04-22 ENCOUNTER — Ambulatory Visit (INDEPENDENT_AMBULATORY_CARE_PROVIDER_SITE_OTHER): Payer: Medicare Other | Admitting: Nurse Practitioner

## 2021-04-22 ENCOUNTER — Encounter: Payer: Self-pay | Admitting: Nurse Practitioner

## 2021-04-22 VITALS — BP 134/71 | HR 76 | Temp 97.9°F | Wt 239.4 lb

## 2021-04-22 DIAGNOSIS — N952 Postmenopausal atrophic vaginitis: Secondary | ICD-10-CM | POA: Diagnosis not present

## 2021-04-22 DIAGNOSIS — R0982 Postnasal drip: Secondary | ICD-10-CM | POA: Diagnosis not present

## 2021-04-22 DIAGNOSIS — N898 Other specified noninflammatory disorders of vagina: Secondary | ICD-10-CM | POA: Diagnosis not present

## 2021-04-22 DIAGNOSIS — T753XXA Motion sickness, initial encounter: Secondary | ICD-10-CM

## 2021-04-22 MED ORDER — SCOPOLAMINE 1 MG/3DAYS TD PT72: MEDICATED_PATCH | TRANSDERMAL | 1 refills | Status: DC

## 2021-04-22 MED ORDER — REPHRESH VA GEL: 1.0000 | Freq: Every day | VAGINAL | 3 refills | Status: DC | PRN

## 2021-04-22 NOTE — Progress Notes (Signed)
? ?Subjective:  ? ? Patient ID: Debbie Miranda, female    DOB: [birth year], 66 y.o.   MRN: [id number] ? ?HPI ? ?66 year old female with history of hypertension, lung cancer, hemorrhoids presents to clinic with complaints of discomfort on left side vaginal area, possibly dryness for several months. Pt was here for wellness back in November and Dr.Scott recommended internal pelvic exam.  Patient denies any discharge or odor. ? ?Patient also concerned about 2 small bumps that she has noticed in her vulvar area.  Patient denies that areas itch or hurt or cause any discomfort.  Patient wanting to know what they are. ? ?Patient also states that she has noticed some postnasal drip and believes believes she is getting a sinus infection.  ? ?Patient also request refills on scopolamine patches ; pt is going on a boat trip in middle of July to [place] and is concerned about motion sickness.  Patient states that she was prescribed scopolamine patches for shorter cruise which helped. ? ? ?Review of Systems  ?Genitourinary:   ?     Vaginal dryness and discomfort.  ?All other systems reviewed and are negative. ? ?   ?Objective:  ? Physical Exam ?Vitals reviewed. Exam conducted with a chaperone present.  ?Cardiovascular:  ?   Rate and Rhythm: Normal rate and regular rhythm.  ?   Pulses: Normal pulses.  ?   Heart sounds: No murmur heard. ?Pulmonary:  ?   Effort: Pulmonary effort is normal. No respiratory distress.  ?   Breath sounds: Normal breath sounds. No wheezing.  ?Abdominal:  ?   General: Abdomen is flat.  ?   Palpations: Abdomen is soft.  ?   Hernia: There is no hernia in the left inguinal area or right inguinal area.  ?Genitourinary: ?   General: Normal vulva.  ?   Exam position: Lithotomy position.  ?   Pubic Area: No rash or pubic lice.   ?   Labia:     ?   Right: Lesion present. No rash, tenderness or injury.     ?   Left: Lesion present. No rash, tenderness or injury.   ?   Vagina: No signs of injury and foreign body.  Tenderness present. No vaginal discharge, erythema, bleeding, lesions or prolapsed vaginal walls.  ?   Uterus: Absent.   ?   Rectum: Normal.  ?   Comments: Small pearly nontender papule noted to proximal right labia majora.  No redness, no discharge, no swelling noted ?Small pearly nontender papule noted to distal left labia majora.No redness, no discharge, no swelling noted. ? ?Vaginal walls pale to color.  Decreased vaginal discharge noted. ? ?No cervix appreciated during exam.  No uterus appreciated during exam.  Adnexa not appreciated during exam. ? ?Mild bulging noted to anterior wall of vagina. ?Musculoskeletal:  ?   Comments: Grossly intact  ?Lymphadenopathy:  ?   Lower Body: No right inguinal adenopathy. No left inguinal adenopathy.  ?Skin: ?   General: Skin is warm.  ?   Capillary Refill: Capillary refill takes less than 2 seconds.  ?Neurological:  ?   Comments: Grossly intact  ?Psychiatric:     ?   Mood and Affect: Mood normal.     ?   Behavior: Behavior normal.  ? ? ? ? ? ?   ?Assessment & Plan:  ? ?1. Vaginal atrophy ?-Patient likely has vaginal atrophy. ?- Vaginal Lubricant (REPHRESH) GEL; Place 1 applicator vaginally daily as needed (vaginal dryness).  Dispense:  2 g; Refill: 3 ?-Bulging noted to the anterior wall of the vagina suspect that patient may have mild cystocele. ?-Patient to follow-up with urology as scheduled. ?-Patient to let provider know if she would like to be seen by GYN. ?-Return to clinic as needed. ? ?2. Vaginal lesion ?-Likely benign. 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 ?-Return to clinic if there are any changes. ? ?3. Post-nasal drip ?-Encourage patient to use Nettie pot to prevent postnasal drip from turning into a bacterial sinus infection. ?-Patient stated understanding ?-Return to clinic as needed. ? ?4. Motion sickness, initial encounter ?- scopolamine (TRANSDERM-SCOP) 1 MG/3DAYS; APPLY 1 PATCH AS NEEDED  FOR SEA SICKNESSAPPLY 1 PATCH AS NEEDED FOR SEA SICKNESS  Dispense: 6 patch; Refill: 1 ? ?  ?Note:  This document was prepared using Dragon voice recognition software and may include unintentional dictation errors. ? ?

## 2021-05-01 ENCOUNTER — Ambulatory Visit: Payer: BC Managed Care – PPO | Admitting: Nurse Practitioner

## 2021-06-15 ENCOUNTER — Telehealth: Payer: Self-pay | Admitting: *Deleted

## 2021-06-15 MED ORDER — CIPROFLOXACIN HCL 500 MG PO TABS: 500.0000 mg | ORAL_TABLET | Freq: Two times a day (BID) | ORAL | 0 refills | Status: AC

## 2021-06-15 MED ORDER — METRONIDAZOLE 500 MG PO TABS: 500.0000 mg | ORAL_TABLET | Freq: Three times a day (TID) | ORAL | 0 refills | Status: AC

## 2021-06-15 NOTE — Telephone Encounter (Signed)
Patient informed medications sent and to follow up if needed. Verbalized understanding.

## 2021-06-15 NOTE — Telephone Encounter (Signed)
Patient says she will be going on a cruise the second week of next month and would like to get a couple of prescriptions for diverticulitis prescribed before she leaves. She isn't having any issues at this time. She would like to have the medications on hand if something was to develop.  They are Cipro 500 mg BID and Metronidazole 500 mg TID. Please advise. Thank you

## 2021-06-15 NOTE — Telephone Encounter (Signed)
Prescriptions sent to pharmacy

## 2021-06-15 NOTE — Telephone Encounter (Signed)
It would be fine to do a 10-day supply of each Cipro 500 mg twice daily for 10 days Flagyl 500 mg 1 taken 3 times daily for 10 days Obviously if she does have an attack of diverticulitis and feels it is bad enough to treat with antibiotics it would also be wise for her to do a follow-up

## 2021-07-06 ENCOUNTER — Other Ambulatory Visit: Payer: Self-pay | Admitting: Family Medicine

## 2021-07-06 DIAGNOSIS — Z0000 Encounter for general adult medical examination without abnormal findings: Secondary | ICD-10-CM

## 2021-08-04 ENCOUNTER — Ambulatory Visit (INDEPENDENT_AMBULATORY_CARE_PROVIDER_SITE_OTHER): Payer: Medicare Other | Admitting: Family Medicine

## 2021-08-04 ENCOUNTER — Ambulatory Visit (HOSPITAL_COMMUNITY)
Admission: RE | Admit: 2021-08-04 | Discharge: 2021-08-04 | Disposition: A | Discharge status: Home / Self Care | Payer: Medicare Other | Source: Ambulatory Visit | Attending: Family Medicine | Admitting: Family Medicine

## 2021-08-04 ENCOUNTER — Encounter: Payer: Self-pay | Admitting: Family Medicine

## 2021-08-04 VITALS — HR 93 | Temp 99.7°F | Ht 66.25 in | Wt 255.0 lb

## 2021-08-04 DIAGNOSIS — R062 Wheezing: Secondary | ICD-10-CM | POA: Diagnosis present

## 2021-08-04 DIAGNOSIS — R059 Cough, unspecified: Secondary | ICD-10-CM

## 2021-08-04 DIAGNOSIS — H65111 Acute and subacute allergic otitis media (mucoid) (sanguinous) (serous), right ear: Secondary | ICD-10-CM | POA: Diagnosis not present

## 2021-08-04 DIAGNOSIS — R509 Fever, unspecified: Secondary | ICD-10-CM

## 2021-08-04 MED ORDER — ALBUTEROL SULFATE HFA 108 (90 BASE) MCG/ACT IN AERS: INHALATION_SPRAY | RESPIRATORY_TRACT | 2 refills | Status: DC

## 2021-08-04 MED ORDER — AMOXICILLIN-POT CLAVULANATE 875-125 MG PO TABS: 1.0000 | ORAL_TABLET | Freq: Two times a day (BID) | ORAL | 0 refills | Status: AC

## 2021-08-04 NOTE — Progress Notes (Signed)
   Subjective:    Patient ID: Debbie Miranda, female    DOB: 1955/05/31, 66 y.o.   MRN: [id number]  Cough The current episode started in the past 7 days. The cough is Productive of sputum. Associated symptoms include chills, ear pain, a fever, headaches, nasal congestion, a sore throat, sweats and wheezing.  Has tried mucinex, coricidin HBP, tylenol Patient was in [place] 4 days in [place] 7-day cruise to [place] 2 days in [place] started getting sick 7 days ago.  COVID test was negative.  Relates body aches sore throat runny nose cough wheezing difficulty breathing at times.   Review of Systems  Constitutional:  Positive for chills and fever.  HENT:  Positive for ear pain and sore throat.   Respiratory:  Positive for cough and wheezing.   Neurological:  Positive for headaches.       Objective:   Physical Exam Right ear red left ear normal lungs bilateral expiratory wheezes not in respiratory distress sitting still but when she walks she states she gets way out of breath.  Extremities no edema       Assessment & Plan:  Viral illness Possible COVID COVID test along with RSV and flu taken Await the findings Stat chest x-ray ordered Warning signs regarding hypoxia was discussed Follow-up if progressive troubles or worse Albuterol 2 puffs every 2-4 hours Warning signs regarding respiratory distress was discussed and the importance of going to the ER if worsening illness

## 2021-08-05 LAB — COVID-19, FLU A+B AND RSV
Influenza A, NAA: DETECTED — AB
Influenza B, NAA: NOT DETECTED
RSV, NAA: NOT DETECTED
SARS-CoV-2, NAA: NOT DETECTED

## 2021-08-05 LAB — SPECIMEN STATUS REPORT

## 2021-08-11 ENCOUNTER — Other Ambulatory Visit: Payer: Self-pay | Admitting: Family Medicine

## 2021-08-11 MED ORDER — ALBUTEROL SULFATE HFA 108 (90 BASE) MCG/ACT IN AERS: INHALATION_SPRAY | RESPIRATORY_TRACT | 2 refills | Status: DC

## 2021-08-17 ENCOUNTER — Ambulatory Visit (INDEPENDENT_AMBULATORY_CARE_PROVIDER_SITE_OTHER): Payer: Medicare Other | Admitting: Family Medicine

## 2021-08-17 ENCOUNTER — Encounter: Payer: Self-pay | Admitting: Family Medicine

## 2021-08-17 VITALS — BP 132/86 | HR 82 | Temp 98.4°F | Ht 66.25 in | Wt 250.0 lb

## 2021-08-17 DIAGNOSIS — H9192 Unspecified hearing loss, left ear: Secondary | ICD-10-CM

## 2021-08-17 DIAGNOSIS — H6982 Other specified disorders of Eustachian tube, left ear: Secondary | ICD-10-CM

## 2021-08-17 DIAGNOSIS — J019 Acute sinusitis, unspecified: Secondary | ICD-10-CM | POA: Diagnosis not present

## 2021-08-17 MED ORDER — AZELASTINE HCL 0.1 % NA SOLN: 2.0000 | Freq: Two times a day (BID) | NASAL | 12 refills | Status: DC

## 2021-08-17 MED ORDER — CEFDINIR 300 MG PO CAPS: 300.0000 mg | ORAL_CAPSULE | Freq: Two times a day (BID) | ORAL | 0 refills | Status: DC

## 2021-08-17 NOTE — Progress Notes (Signed)
   Subjective:    Patient ID: Debbie Miranda, female    DOB: [birth year], 66 y.o.   MRN: [id number]  HPI Follow up post flu - ear pain both ears , more of L ear   Linguering cough and congestion w/ some sob with activ- completed abx and using albuterol inh prn   Patient with ongoing cough congestion bringing up some discolored phlegm pressure in both ears more pain on the left ear decreased hearing on the left side     Review of Systems     Objective:   Physical Exam Left eardrum slightly red no fluid noted right eardrum looks good lungs clear heart regular  Also using Astelin prescribed today     Assessment & Plan:  Eustachian tube dysfunction with left ear pressure pain possible sinusitis antibiotics doing better from the flu if her hearing does not improve over the course the next couple weeks referral to ENT patient to give [place] update Hearing screening here at the office shows mild hearing loss on the left side

## 2021-10-12 ENCOUNTER — Other Ambulatory Visit: Payer: Self-pay | Admitting: Family Medicine

## 2021-10-12 DIAGNOSIS — Z0000 Encounter for general adult medical examination without abnormal findings: Secondary | ICD-10-CM

## 2021-10-26 ENCOUNTER — Other Ambulatory Visit: Payer: Self-pay | Admitting: Family Medicine

## 2021-10-26 DIAGNOSIS — Z1231 Encounter for screening mammogram for malignant neoplasm of breast: Secondary | ICD-10-CM

## 2021-10-29 ENCOUNTER — Ambulatory Visit (INDEPENDENT_AMBULATORY_CARE_PROVIDER_SITE_OTHER): Payer: Medicare Other | Admitting: Family Medicine

## 2021-10-29 VITALS — BP 136/86 | Temp 98.7°F | Wt 265.6 lb

## 2021-10-29 DIAGNOSIS — J22 Unspecified acute lower respiratory infection: Secondary | ICD-10-CM

## 2021-10-29 DIAGNOSIS — R6889 Other general symptoms and signs: Secondary | ICD-10-CM | POA: Diagnosis not present

## 2021-10-29 DIAGNOSIS — R059 Cough, unspecified: Secondary | ICD-10-CM

## 2021-10-29 DIAGNOSIS — J4521 Mild intermittent asthma with (acute) exacerbation: Secondary | ICD-10-CM | POA: Diagnosis not present

## 2021-10-29 DIAGNOSIS — J019 Acute sinusitis, unspecified: Secondary | ICD-10-CM

## 2021-10-29 MED ORDER — PREDNISONE 20 MG PO TABS: ORAL_TABLET | ORAL | 0 refills | Status: DC

## 2021-10-29 MED ORDER — AMOXICILLIN-POT CLAVULANATE 875-125 MG PO TABS: 1.0000 | ORAL_TABLET | Freq: Two times a day (BID) | ORAL | 0 refills | Status: DC

## 2021-10-29 NOTE — Progress Notes (Signed)
   Subjective:    Patient ID: Debbie Miranda, female    DOB: October 19, 1955, 66 y.o.   MRN: [id number]  HPI Patient does not feel well body aches not feeling good runny nose sore throat coughing congestion bringing up phlegm denies severe wheezing but does have tight bronchioles did have some sweats.  Energy level subpar   Review of Systems     Objective:   Physical Exam  Lungs clear HEENT congestion noted no tenderness currently      Assessment & Plan:  1. Cough, unspecified type Triple swab - COVID-19, Flu A+B and RSV  2. Acute rhinosinusitis Antibiotic prescribed - COVID-19, Flu A+B and RSV  3. Mild intermittent reactive airway disease with acute exacerbation Triple swab Prescription for prednisone to use if necessary parameters discussed - COVID-19, Flu A+B and RSV  4. Flu-like symptoms Triple swab await results - COVID-19, Flu A+B and RSV

## 2021-10-30 LAB — COVID-19, FLU A+B AND RSV
Influenza A, NAA: NOT DETECTED
Influenza B, NAA: NOT DETECTED
RSV, NAA: NOT DETECTED
SARS-CoV-2, NAA: NOT DETECTED

## 2021-11-16 ENCOUNTER — Ambulatory Visit (INDEPENDENT_AMBULATORY_CARE_PROVIDER_SITE_OTHER): Payer: Medicare Other | Admitting: Nurse Practitioner

## 2021-11-16 VITALS — BP 130/86 | Temp 97.3°F | Ht 66.25 in | Wt 266.8 lb

## 2021-11-16 DIAGNOSIS — R059 Cough, unspecified: Secondary | ICD-10-CM | POA: Diagnosis not present

## 2021-11-16 MED ORDER — CETIRIZINE HCL 10 MG PO TABS: 10.0000 mg | ORAL_TABLET | Freq: Every day | ORAL | 1 refills | Status: DC

## 2021-11-16 MED ORDER — FLUTICASONE PROPIONATE 50 MCG/ACT NA SUSP: 2.0000 | Freq: Every day | NASAL | 6 refills | Status: DC

## 2021-11-16 MED ORDER — PREDNISONE 20 MG PO TABS: ORAL_TABLET | ORAL | 0 refills | Status: DC

## 2021-11-16 MED ORDER — AMOXICILLIN 875 MG PO TABS: 875.0000 mg | ORAL_TABLET | Freq: Two times a day (BID) | ORAL | 0 refills | Status: AC

## 2021-11-16 NOTE — Progress Notes (Signed)
   Subjective:    Patient ID: Debbie Miranda, female    DOB: [birth year], 66 y.o.   MRN: [id number]  Cough This is a new problem. The current episode started 1 to 4 weeks ago.   Patient states she was seen for illness several weeks ago and has finished all her meds and still has lingering cough.   Review of Systems  Respiratory:  Positive for cough.        Objective:   Physical Exam        Assessment & Plan:

## 2021-11-18 ENCOUNTER — Encounter: Payer: Self-pay | Admitting: Nurse Practitioner

## 2021-12-11 ENCOUNTER — Ambulatory Visit (INDEPENDENT_AMBULATORY_CARE_PROVIDER_SITE_OTHER): Payer: Medicare Other

## 2021-12-11 VITALS — Ht 66.0 in | Wt 266.0 lb

## 2021-12-11 DIAGNOSIS — Z0000 Encounter for general adult medical examination without abnormal findings: Secondary | ICD-10-CM

## 2021-12-11 NOTE — Patient Instructions (Signed)
Debbie Miranda , Thank you for taking time to come for your Medicare Wellness Visit. I appreciate your ongoing commitment to your health goals. Please review the following plan we discussed and let me know if I can assist you in the future.   These are the goals we discussed:  Goals      DIET - EAT MORE FRUITS AND VEGETABLES        This is a list of the screening recommended for you and due dates:  Health Maintenance  Topic Date Due   Pneumonia Vaccine (3 - PPSV23 or PCV20) 11/27/2020   DEXA scan (bone density measurement)  11/27/2020   COVID-19 Vaccine (7 - 2023-24 season) 12/20/2021   Mammogram  01/13/2022   Medicare Annual Wellness Visit  12/12/2022   DTaP/Tdap/Td vaccine (2 - Tdap) 12/11/2024   Colon Cancer Screening  10/23/2030   Flu Shot  Completed   Hepatitis C Screening: USPSTF Recommendation to screen - Ages 18-79 yo.  Completed   Zoster (Shingles) Vaccine  Completed   HPV Vaccine  Aged Out    Advanced directives: Please bring a copy of your health care power of attorney and living will to the office to be added to your chart at your convenience.   Conditions/risks identified: Aim for 30 minutes of exercise or brisk walking, 6-8 glasses of water, and 5 servings of fruits and vegetables each day.  Next appointment: Follow up in one year for your annual wellness visit    Preventive Care 65 Years and Older, Female Preventive care refers to lifestyle choices and visits with your health care provider that can promote health and wellness. What does preventive care include? A yearly physical exam. This is also called an annual well check. Dental exams once or twice a year. Routine eye exams. Ask your health care provider how often you should have your eyes checked. Personal lifestyle choices, including: Daily care of your teeth and gums. Regular physical activity. Eating a healthy diet. Avoiding tobacco and drug use. Limiting alcohol use. Practicing safe sex. Taking  low-dose aspirin every day. Taking vitamin and mineral supplements as recommended by your health care provider. What happens during an annual well check? The services and screenings done by your health care provider during your annual well check will depend on your age, overall health, lifestyle risk factors, and family history of disease. Counseling  Your health care provider may ask you questions about your: Alcohol use. Tobacco use. Drug use. Emotional well-being. Home and relationship well-being. Sexual activity. Eating habits. History of falls. Memory and ability to understand (cognition). Work and work Statistician. Reproductive health. Screening  You may have the following tests or measurements: Height, weight, and BMI. Blood pressure. Lipid and cholesterol levels. These may be checked every 5 years, or more frequently if you are over 61 years old. Skin check. Lung cancer screening. You may have this screening every year starting at age 64 if you have a 30-pack-year history of smoking and currently smoke or have quit within the past 15 years. Fecal occult blood test (FOBT) of the stool. You may have this test every year starting at age 43. Flexible sigmoidoscopy or colonoscopy. You may have a sigmoidoscopy every 5 years or a colonoscopy every 10 years starting at age 63. Hepatitis C blood test. Hepatitis B blood test. Sexually transmitted disease (STD) testing. Diabetes screening. This is done by checking your blood sugar (glucose) after you have not eaten for a while (fasting). You may have this done  every 1-3 years. Bone density scan. This is done to screen for osteoporosis. You may have this done starting at age 8. Mammogram. This may be done every 1-2 years. Talk to your health care provider about how often you should have regular mammograms. Talk with your health care provider about your test results, treatment options, and if necessary, the need for more tests. Vaccines   Your health care provider may recommend certain vaccines, such as: Influenza vaccine. This is recommended every year. Tetanus, diphtheria, and acellular pertussis (Tdap, Td) vaccine. You may need a Td booster every 10 years. Zoster vaccine. You may need this after age 73. Pneumococcal 13-valent conjugate (PCV13) vaccine. One dose is recommended after age 13. Pneumococcal polysaccharide (PPSV23) vaccine. One dose is recommended after age 17. Talk to your health care provider about which screenings and vaccines you need and how often you need them. This information is not intended to replace advice given to you by your health care provider. Make sure you discuss any questions you have with your health care provider. Document Released: 01/24/2015 Document Revised: 09/17/2015 Document Reviewed: 10/29/2014 Elsevier Interactive Patient Education  2017 [place] Prevention in the Home Falls can cause injuries. They can happen to people of all ages. There are many things you can do to make your home safe and to help prevent falls. What can I do on the outside of my home? Regularly fix the edges of walkways and driveways and fix any cracks. Remove anything that might make you trip as you walk through a door, such as a raised step or threshold. Trim any bushes or trees on the path to your home. Use bright outdoor lighting. Clear any walking paths of anything that might make someone trip, such as rocks or tools. Regularly check to see if handrails are loose or broken. Make sure that both sides of any steps have handrails. Any raised decks and porches should have guardrails on the edges. Have any leaves, snow, or ice cleared regularly. Use sand or salt on walking paths during winter. Clean up any spills in your garage right away. This includes oil or grease spills. What can I do in the bathroom? Use night lights. Install grab bars by the toilet and in the tub and shower. Do not use towel  bars as grab bars. Use non-skid mats or decals in the tub or shower. If you need to sit down in the shower, use a plastic, non-slip stool. Keep the floor dry. Clean up any water that spills on the floor as soon as it happens. Remove soap buildup in the tub or shower regularly. Attach bath mats securely with double-sided non-slip rug tape. Do not have throw rugs and other things on the floor that can make you trip. What can I do in the bedroom? Use night lights. Make sure that you have a light by your bed that is easy to reach. Do not use any sheets or blankets that are too big for your bed. They should not hang down onto the floor. Have a firm chair that has side arms. You can use this for support while you get dressed. Do not have throw rugs and other things on the floor that can make you trip. What can I do in the kitchen? Clean up any spills right away. Avoid walking on wet floors. Keep items that you use a lot in easy-to-reach places. If you need to reach something above you, use a strong step stool that has  a grab bar. Keep electrical cords out of the way. Do not use floor polish or wax that makes floors slippery. If you must use wax, use non-skid floor wax. Do not have throw rugs and other things on the floor that can make you trip. What can I do with my stairs? Do not leave any items on the stairs. Make sure that there are handrails on both sides of the stairs and use them. Fix handrails that are broken or loose. Make sure that handrails are as long as the stairways. Check any carpeting to make sure that it is firmly attached to the stairs. Fix any carpet that is loose or worn. Avoid having throw rugs at the top or bottom of the stairs. If you do have throw rugs, attach them to the floor with carpet tape. Make sure that you have a light switch at the top of the stairs and the bottom of the stairs. If you do not have them, ask someone to add them for you. What else can I do to help  prevent falls? Wear shoes that: Do not have high heels. Have rubber bottoms. Are comfortable and fit you well. Are closed at the toe. Do not wear sandals. If you use a stepladder: Make sure that it is fully opened. Do not climb a closed stepladder. Make sure that both sides of the stepladder are locked into place. Ask someone to hold it for you, if possible. Clearly mark and make sure that you can see: Any grab bars or handrails. First and last steps. Where the edge of each step is. Use tools that help you move around (mobility aids) if they are needed. These include: Canes. Walkers. Scooters. Crutches. Turn on the lights when you go into a dark area. Replace any light bulbs as soon as they burn out. Set up your furniture so you have a clear path. Avoid moving your furniture around. If any of your floors are uneven, fix them. If there are any pets around you, be aware of where they are. Review your medicines with your doctor. Some medicines can make you feel dizzy. This can increase your chance of falling. Ask your doctor what other things that you can do to help prevent falls. This information is not intended to replace advice given to you by your health care provider. Make sure you discuss any questions you have with your health care provider. Document Released: 10/24/2008 Document Revised: 06/05/2015 Document Reviewed: 02/01/2014 Elsevier Interactive Patient Education  2017 [employer].

## 2021-12-11 NOTE — Progress Notes (Signed)
Subjective:   Debbie Miranda is a 66 y.o. female who presents for an Initial Medicare Annual Wellness Visit.  I connected with  Debbie Miranda on 12/11/21 by a audio enabled telemedicine application and verified that I am speaking with the correct person using two identifiers.  Patient Location: Home  Provider Location: Office/Clinic  I discussed the limitations of evaluation and management by telemedicine. The patient expressed understanding and agreed to proceed.   Review of Systems     Cardiac Risk Factors include: advanced age (>72mn, >>40women);dyslipidemia;obesity (BMI >30kg/m2)     Objective:    Today's Vitals   12/11/21 1326  Weight: 266 lb (120.7 kg)  Height: '5\' 6"'$  (1.676 m)   Body mass index is 42.93 kg/m.     12/11/2021    1:38 PM 10/22/2020    1:41 PM 08/09/2018    4:00 PM 05/31/2013    3:16 PM 11/16/2011    9:17 AM  Advanced Directives  Does Patient Have a Medical Advance Directive? Yes Yes No Patient has advance directive, copy not in chart Patient has advance directive, copy not in chart  Type of Advance Directive Healthcare Power of [place] will  Living will [place] will  Does patient want to make changes to medical advance directive? No - Patient declined No - Patient declined  No change requested   Copy of [place] Chart? No - copy requested No - copy requested     Would patient like information on creating a medical advance directive?   No - Patient declined      Current Medications (verified) Outpatient Encounter Medications as of 12/11/2021  Medication Sig   albuterol (VENTOLIN HFA) 108 (90 Base) MCG/ACT inhaler 2 puffs every 2 to 4 hours as needed for wheezing   azelastine (ASTELIN) 0.1 % nasal spray Place 2 sprays into both nostrils 2 (two) times daily.   Biotin 5000 MCG CAPS Take 5,000 mcg by mouth daily with breakfast.   cetirizine (ZYRTEC ALLERGY) 10 MG  tablet Take 1 tablet (10 mg total) by mouth daily.   Cholecalciferol (VITAMIN D-3) 25 MCG (1000 UT) CAPS Take 1,000 Units by mouth daily with breakfast.    cyclobenzaprine (FLEXERIL) 5 MG tablet 1 to 2 qhs prn insomnia or back spasms   Esomeprazole Magnesium (NEXIUM 24HR) 20 MG TBEC Take 20 mg by mouth daily.   ezetimibe (ZETIA) 10 MG tablet TAKE 1 TABLET BY MOUTH DAILY   fluticasone (FLONASE) 50 MCG/ACT nasal spray Place 2 sprays into both nostrils daily.   levothyroxine (SYNTHROID) 125 MCG tablet TAKE ONE TABLET BY MOUTH EVERY MORNING   Methylcellulose, Laxative, (CITRUCEL) 500 MG TABS Take 500 mg by mouth daily.    Multiple Vitamins-Minerals (CENTRUM SILVER) tablet Take 1 tablet by mouth daily with breakfast.    predniSONE (DELTASONE) 20 MG tablet 2 qd for 5d   rosuvastatin (CRESTOR) 40 MG tablet TAKE ONE (1) TABLET BY MOUTH EVERY DAY   scopolamine (TRANSDERM-SCOP) 1 MG/3DAYS APPLY 1 PATCH AS NEEDED FOR SEA SICKNESSAPPLY 1 PATCH AS NEEDED FOR SEA SICKNESS   Vaginal Lubricant (REPHRESH) GEL Place 1 applicator vaginally daily as needed (vaginal dryness).   No facility-administered encounter medications on file as of 12/11/2021.    Allergies (verified) Codeine and Rivaroxaban   History: Past Medical History:  Diagnosis Date   Acute ischemic colitis ([place] 08/09/[place]   Anal itching 10/27/2011   Arthritis    [place]   Carcinoid tumor of lung 2013   Left   Cataract    beginning stages,bilateral   Contact lens/glasses fitting    wares contacts or glasses   Corn of toe 5th right 08/16/2012   Costochondritis    Diverticulitis 2012   Dysplastic nevi    Esophageal reflux    External hemorrhoids without mention of complication 6712   GERD with stricture 05/16/2012   Headache(784.0)    HTN (hypertension) 08/09/2018   Hyperlipidemia    Hypertension    pt denies   Hypothyroidism    IFG (impaired fasting glucose)    Impaired fasting glucose 05/16/2012   Mycotic  toenails 12/10/2010   1st toenail right-lasered nail 12/10/2010-present (last on 06/15/2012)   Neuropathic pain 06/30/2016   Obesity    Personal history of colonic polyps 07/30/2015   SSA   PONV (postoperative nausea and vomiting)    Positive H. pylori test    Thyroid nodule 05/16/2012   Benign, 2013, [place]    Urge incontinence 07/05/2018   Venous insufficiency of left leg 03/29/2016   Past Surgical History:  Procedure Laterality Date   ABDOMINAL HYSTERECTOMY     APPENDECTOMY     COLONOSCOPY     ESOPHAGOGASTRODUODENOSCOPY     EXCISION HAGLUND'S DEFORMITY WITH ACHILLES TENDON REPAIR Right 06/07/2013   Procedure: RIGHT ACHILLES DEBRIDEMENT; [id number] EXCISION;  Surgeon: Wylene Simmer, MD;  Location: [place];  Service: Orthopedics;  Laterality: Right;   EXCISION OF SKIN TAG  11/18/2011   Procedure: EXCISION OF SKIN TAG;  Surgeon: Leighton Ruff, MD;  Location: [facility];  Service: General;  Laterality: N/A;  posterior   GASTROCNEMIUS RECESSION Right 06/07/2013   Procedure: GASTROCNEMIUS RECESSION;  Surgeon: Wylene Simmer, MD;  Location: [place];  Service: Orthopedics;  Laterality: Right;   HEMORRHOID BANDING     HEMORRHOID SURGERY  11/18/2011   Procedure: HEMORRHOIDECTOMY;  Surgeon: Leighton Ruff, MD;  Location: [facility];  Service: General;;   LEFT OOPHORECTOMY     LUNG LOBECTOMY  12/2011   [place] removed for carcinoid   PLANTAR FASCIA SURGERY     rectal fissure repair     REPLACEMENT TOTAL KNEE BILATERAL  2022   THYROID SURGERY     nodule and partial thyroidectomy   TONSILLECTOMY     Family History  Problem Relation Age of Onset   Kidney cancer Father    Pulmonary embolism Mother    Breast cancer Maternal Grandmother    Cancer Maternal Grandmother        breast   Heart disease Maternal Grandfather    Colon cancer Neg Hx    Colon polyps Neg Hx    Esophageal cancer Neg Hx    Rectal cancer Neg  Hx    Stomach cancer Neg Hx    Social History   Socioeconomic History   Marital status: Married    Spouse name: Not on file   Number of children: 1   Years of education: Not on file   Highest education level: Not on file  Occupational History   Occupation: homemaker  Tobacco Use   Smoking status: Never   Smokeless tobacco: Never  Vaping Use   Vaping Use: Never used  Substance and Sexual Activity   Alcohol use: Yes    Alcohol/week: 0.0 standard drinks of alcohol    Comment: wine-occ   Drug use: No   Sexual activity: Yes    Birth control/protection:  Surgical  Other Topics Concern   Not on file  Social History Narrative   She is married, she is a homemaker, she has 1 son who is a Sport and exercise psychologist, he is an Engineer, mining in [place]   Very occasional alcohol never smoker no tobacco no drug use   Social Determinants of Radio broadcast assistant Strain: Low Risk  (12/11/2021)   Overall Financial Resource Strain (CARDIA)    Difficulty of Paying Living Expenses: Not hard at all  Food Insecurity: No Food Insecurity (12/11/2021)   Hunger Vital Sign    Worried About Running Out of Food in the Last Year: Never true    [place] in the Last Year: Never true  Transportation Needs: No Transportation Needs (12/11/2021)   PRAPARE - Hydrologist (Medical): No    Lack of Transportation (Non-Medical): No  Physical Activity: Sufficiently Active (12/11/2021)   Exercise Vital Sign    Days of Exercise per Week: 5 days    Minutes of Exercise per Session: 30 min  Stress: No Stress Concern Present (12/11/2021)   [place]    Feeling of Stress : Not at all  Social Connections: [place] (12/11/2021)   Social Connection and Isolation Panel [NHANES]    Frequency of Communication with Friends and Family: More than three times a week    Frequency of Social Gatherings  with Friends and Family: Three times a week    Attends Religious Services: More than 4 times per year    Active Member of Clubs or Organizations: Yes    Attends Music therapist: More than 4 times per year    Marital Status: Married    Tobacco Counseling Counseling given: Not Answered   Clinical Intake:  Pre-visit preparation completed: Yes  Pain : No/denies pain  Diabetes: No  How often do you need to have someone help you when you read instructions, pamphlets, or other written materials from your doctor or pharmacy?: 1 - Never  Diabetic?No   Interpreter Needed?: No  Information entered by :: Denman George LPN   Activities of Daily Living    12/11/2021    1:38 PM  In your present state of health, do you have any difficulty performing the following activities:  Hearing? 0  Vision? 0  Difficulty concentrating or making decisions? 0  Walking or climbing stairs? 0  Dressing or bathing? 0  Doing errands, shopping? 0  Preparing Food and eating ? N  Using the Toilet? N  In the past six months, have you accidently leaked urine? N  Do you have problems with loss of bowel control? N  Managing your Medications? N  Managing your Finances? N  Housekeeping or managing your Housekeeping? N    Patient Care Team: Kathyrn Drown, MD as PCP - General (Family Medicine) Jerline Pain, MD as PCP - Cardiology (Cardiology) Luberta Mutter, MD as Consulting Physician (Ophthalmology)  Indicate any recent Medical Services you may have received from other than [facility] providers in the past year (date may be approximate).     Assessment:   This is a routine wellness examination for Lakshmi.  Hearing/Vision screen Hearing Screening - Comments:: No concerns  Vision Screening - Comments:: Wears rx glasses - up to date with routine eye exams with Dr. Ellie Lunch  Dietary issues and exercise activities discussed: Current Exercise Habits: Home exercise routine, Type of exercise:  walking, Time (Minutes):  30, Frequency (Times/Week): 5, Weekly Exercise (Minutes/Week): 150, Intensity: Mild   Goals Addressed             This Visit's Progress    DIET - EAT MORE FRUITS AND VEGETABLES         Depression Screen    12/11/2021    1:37 PM 11/20/2020    8:53 AM 05/13/2020    8:31 AM 09/10/2019    9:40 AM  PHQ 2/9 Scores  PHQ - 2 Score 0 0 0 0  PHQ- 9 Score    1    Fall Risk    12/11/2021    1:35 PM 11/20/2020    8:53 AM 05/13/2020    8:31 AM 11/01/2017    9:40 AM  Fall Risk   Falls in the past year? 1 1 0 No  Number falls in past yr: 0 0    Injury with Fall? 0 0    Risk for fall due to : No Fall Risks Impaired balance/gait No Fall Risks   Follow up Falls evaluation completed;Education provided;Falls prevention discussed Falls evaluation completed;Education provided Falls evaluation completed     FALL RISK PREVENTION PERTAINING TO THE HOME:  Any stairs in or around the home? Yes  If so, are there any without handrails? Yes  Home free of loose throw rugs in walkways, pet beds, electrical cords, etc? Yes  Adequate lighting in your home to reduce risk of falls? Yes   ASSISTIVE DEVICES UTILIZED TO PREVENT FALLS:  Life alert? No  Use of a cane, walker or w/c? No  Grab bars in the bathroom? Yes  Shower chair or bench in shower? No  Elevated toilet seat or a handicapped toilet? No   TIMED UP AND GO:  Was the test performed? No .  Length of time to ambulate 10 feet: telephonic visit   Cognitive Function:        12/11/2021    1:38 PM  6CIT Screen  What Year? 0 points  What month? 0 points  What time? 0 points  Count back from 20 0 points  Months in reverse 0 points  Repeat phrase 0 points  Total Score 0 points    Immunizations Immunization History  Administered Date(s) Administered   H1N1 01/29/2008   Influenza Inj Mdck Quad Pf 10/13/2016, 10/27/2017, 10/18/2018   Influenza, High Dose Seasonal PF 10/11/2021   Influenza,inj,Quad PF,6+ Mos  11/05/2014   Influenza,inj,quad, With Preservative 10/27/2017   Influenza-Unspecified 10/11/2008, 10/15/2013, 11/05/2014, 09/30/2015, 10/11/2017, 10/26/2018, 11/08/2019, 10/22/2020   MODERNA COVID-19 SARS-COV-2 PEDS BIVALENT BOOSTER 6Y-11Y 10/25/2021   Moderna Sars-Covid-2 Vaccination 04/03/2019   PFIZER(Purple Top)SARS-COV-2 Vaccination 02/19/2019, 03/12/2019, 03/13/2019, 11/10/2019   Pneumococcal Conjugate-13 12/12/2014   Pneumococcal Polysaccharide-23 01/12/2012   Td 12/12/2014   Zoster Recombinat (Shingrix) 07/10/2017, 08/09/2017   Zoster, Live 12/08/2015    TDAP status: Up to date  Flu Vaccine status: Up to date  Pneumococcal vaccine status: Due, Education has been provided regarding the importance of this vaccine. Advised may receive this vaccine at local pharmacy or Health Dept. Aware to provide a copy of the vaccination record if obtained from local pharmacy or Health Dept. Verbalized acceptance and understanding.  Covid-19 vaccine status: Information provided on how to obtain vaccines.   Qualifies for Shingles Vaccine? Yes   Zostavax completed No   Shingrix Completed?: Yes  Screening Tests Health Maintenance  Topic Date Due   Pneumonia Vaccine 74+ Years old (3 - PPSV23 or PCV20) 11/27/2020   DEXA SCAN  11/27/2020  COVID-19 Vaccine (7 - 2023-24 season) 12/20/2021   MAMMOGRAM  01/13/2022   Medicare Annual Wellness (AWV)  12/12/2022   DTaP/Tdap/Td (2 - Tdap) 12/11/2024   COLONOSCOPY (Pts 45-41yr Insurance coverage will need to be confirmed)  10/23/2030   INFLUENZA VACCINE  Completed   Hepatitis C Screening  Completed   Zoster Vaccines- Shingrix  Completed   HPV VACCINES  Aged Out    Health Maintenance  Health Maintenance Due  Topic Date Due   Pneumonia Vaccine 66 Years old (375- PPSV23 or PCV20) 11/27/2020   DEXA SCAN  11/27/2020    Colorectal cancer screening: Type of screening: Colonoscopy. Completed 10/22/20. Repeat every 10 years  Mammogram status:  Completed 01/13/21. Repeat every year  Bone Density status: Ordered at next office visit. Pt provided with contact info and advised to call to schedule appt.  Lung Cancer Screening: (Low Dose CT Chest recommended if Age 66-80years, 30 pack-year currently smoking OR have quit w/in 15years.) does not qualify.   Additional Screening:  Hepatitis C Screening: does qualify; Completed 10/28/17  Vision Screening: Recommended annual ophthalmology exams for early detection of glaucoma and other disorders of the eye. Is the patient up to date with their annual eye exam?  Yes  Who is the provider or what is the name of the office in which the patient attends annual eye exams? Dr. MEllie Lunch If pt is not established with a provider, would they like to be referred to a provider to establish care?  N/a .   Dental Screening: Recommended annual dental exams for proper oral hygiene  Community Resource Referral / Chronic Care Management: CRR required this visit?  No   CCM required this visit?  No      Plan:     I have personally reviewed and noted the following in the patient's chart:   Medical and social history Use of alcohol, tobacco or illicit drugs  Current medications and supplements including opioid prescriptions. Patient is not currently taking opioid prescriptions. Functional ability and status Nutritional status Physical activity Advanced directives List of other physicians Hospitalizations, surgeries, and ER visits in previous 12 months Vitals Screenings to include cognitive, depression, and falls Referrals and appointments  In addition, I have reviewed and discussed with patient certain preventive protocols, quality metrics, and best practice recommendations. A written personalized care plan for preventive services as well as general preventive health recommendations were provided to patient.     SVanetta Mulders LPN   [id number]  Due to this being a virtual visit, the  after visit summary with patients personalized plan was offered to patient via mail or my-chart. Per request, patient was mailed a copy of AVS  Nurse Notes: No concerns

## 2021-12-14 ENCOUNTER — Other Ambulatory Visit: Payer: Self-pay | Admitting: Family Medicine

## 2021-12-22 ENCOUNTER — Other Ambulatory Visit (INDEPENDENT_AMBULATORY_CARE_PROVIDER_SITE_OTHER): Payer: Medicare Other

## 2021-12-22 ENCOUNTER — Encounter: Payer: Self-pay | Admitting: Internal Medicine

## 2021-12-22 ENCOUNTER — Ambulatory Visit (INDEPENDENT_AMBULATORY_CARE_PROVIDER_SITE_OTHER): Payer: Medicare Other | Admitting: Internal Medicine

## 2021-12-22 VITALS — BP 152/92 | HR 84 | Ht 66.0 in | Wt 259.0 lb

## 2021-12-22 DIAGNOSIS — R194 Change in bowel habit: Secondary | ICD-10-CM | POA: Diagnosis not present

## 2021-12-22 DIAGNOSIS — R10816 Epigastric abdominal tenderness: Secondary | ICD-10-CM | POA: Diagnosis not present

## 2021-12-22 DIAGNOSIS — R1013 Epigastric pain: Secondary | ICD-10-CM

## 2021-12-22 DIAGNOSIS — Z86010 Personal history of colonic polyps: Secondary | ICD-10-CM

## 2021-12-22 DIAGNOSIS — G8929 Other chronic pain: Secondary | ICD-10-CM

## 2021-12-22 LAB — COMPREHENSIVE METABOLIC PANEL
ALT: 24 U/L (ref 0–35)
AST: 22 U/L (ref 0–37)
Albumin: 4.3 g/dL (ref 3.5–5.2)
Alkaline Phosphatase: 60 U/L (ref 39–117)
BUN: 10 mg/dL (ref 6–23)
CO2: 33 mEq/L — ABNORMAL HIGH (ref 19–32)
Calcium: 9.4 mg/dL (ref 8.4–10.5)
Chloride: 101 mEq/L (ref 96–112)
Creatinine, Ser: 0.73 mg/dL (ref 0.40–1.20)
GFR: 85.91 mL/min (ref >60.00)
Glucose, Bld: 93 mg/dL (ref 70–99)
Potassium: 3.9 mEq/L (ref 3.5–5.1)
Sodium: 140 mEq/L (ref 135–145)
Total Bilirubin: 0.5 mg/dL (ref 0.2–1.2)
Total Protein: 6.9 g/dL (ref 6.0–8.3)

## 2021-12-22 LAB — CBC
HCT: 43.9 % (ref 36.0–46.0)
Hemoglobin: 14.7 g/dL (ref 12.0–15.0)
MCHC: 33.5 g/dL (ref 30.0–36.0)
MCV: 94 fl (ref 78.0–100.0)
Platelets: 216 10*3/uL (ref 150.0–400.0)
RBC: 4.67 Mil/uL (ref 3.87–5.11)
RDW: 13 % (ref 11.5–15.5)
WBC: 8 10*3/uL (ref 4.0–10.5)

## 2021-12-22 LAB — AMYLASE: Amylase: 20 U/L — ABNORMAL LOW (ref 27–131)

## 2021-12-22 LAB — LIPASE: Lipase: 18 U/L (ref 11.0–59.0)

## 2021-12-22 NOTE — Progress Notes (Signed)
Debbie Miranda 66 y.o. 01-08-1956 [id number]  Assessment & Plan:   Encounter Diagnoses  Name Primary?   Abdominal pain, chronic, epigastric Yes   Epigastric abdominal tenderness without rebound tenderness    Change in bowel habit    Cause of problems not clear - could be side effects of Zetia. Labs ordered below do not show problems. Hold Zetia and observe for change in symptoms over next 2 weeks - will instruct her to contact me with an update after that. If not better consider imaging. Not inclined to repeat a colonoscopy as had one in 2022.  Did receive Augmentin Rx Oct and Nov but is not having watery stools so do not suspect C diff though could have had some gut dysbiosis from those Rxs  Lab Results  Component Value Date   WBC 8.0 12/22/2021   HGB 14.7 12/22/2021   HCT 43.9 12/22/2021   MCV 94.0 12/22/2021   PLT 216.0 12/22/2021     Chemistry      Component Value Date/Time   NA 140 12/22/2021 1032   NA 140 11/20/2020 1029   K 3.9 12/22/2021 1032   CL 101 12/22/2021 1032   CO2 33 (H) 12/22/2021 1032   BUN 10 12/22/2021 1032   BUN 12 11/20/2020 1029   CREATININE 0.73 12/22/2021 1032   CREATININE 0.73 12/18/2012 0703      Component Value Date/Time   CALCIUM 9.4 12/22/2021 1032   ALKPHOS 60 12/22/2021 1032   AST 22 12/22/2021 1032   ALT 24 12/22/2021 1032   BILITOT 0.5 12/22/2021 1032   BILITOT <0.2 01/21/2021 0809     Lab Results  Component Value Date   LIPASE 18.0 12/22/2021   Lab Results  Component Value Date   AMYLASE 20 (L) 12/22/2021   [id number], Elayne Snare, MD   Subjective:   Chief Complaint: upper abdominal pain and change in bowel habits  HPI 66 yo ww w/ hx of colon polyps, hemorrhoids, ischemic colitis, diverticulitis and GERD c/o 6 months of intermittent upper abdominal discomfort and 5-6 weeks of increased bowel movements from 1/day up to 6-7/day without bleeding. No clear triggers. Zetia added to regimen earlier this year.  Wt Readings  from Last 3 Encounters:  12/22/21 259 lb (117.5 kg)  12/11/21 266 lb (120.7 kg)  11/16/21 266 lb 12.8 oz (121 kg)  11/2020 219#  Was seen for a cough 10/19 and 11/16/21 in primary care Tx w/ Augmentin twice Allergies  Allergen Reactions   Codeine Nausea And Vomiting   Rivaroxaban Rash   Current Meds  Medication Sig   Biotin 5000 MCG CAPS Take 5,000 mcg by mouth daily with breakfast.   Cholecalciferol (VITAMIN D-3) 25 MCG (1000 UT) CAPS Take 1,000 Units by mouth daily with breakfast.    cyclobenzaprine (FLEXERIL) 5 MG tablet TAKE 1 OR 2 TABLETS BY MOUTH AT BEDTIME AS NEEDED FOR INSOMNIA OR BACK SPASMS   Esomeprazole Magnesium (NEXIUM 24HR) 20 MG TBEC Take 20 mg by mouth daily.   ezetimibe (ZETIA) 10 MG tablet TAKE 1 TABLET BY MOUTH DAILY   levothyroxine (SYNTHROID) 125 MCG tablet TAKE ONE TABLET BY MOUTH EVERY MORNING   Multiple Vitamins-Minerals (CENTRUM SILVER) tablet Take 1 tablet by mouth daily with breakfast.    rosuvastatin (CRESTOR) 40 MG tablet TAKE ONE (1) TABLET BY MOUTH EVERY DAY   Vaginal Lubricant (REPHRESH) GEL Place 1 applicator vaginally daily as needed (vaginal dryness).   Past Medical History:  Diagnosis Date   Acute ischemic colitis (  [place]) 08/09/2018   Anal itching 10/27/2011   Arthritis    Cancer ([place])    lung-7years ago   Carcinoid tumor of lung 2013   Left   Cataract    beginning stages,bilateral   Contact lens/glasses fitting    wares contacts or glasses   Corn of toe 5th right 08/16/2012   Costochondritis    Diverticulitis 2012   Dysplastic nevi    Esophageal reflux    External hemorrhoids without mention of complication 9678   GERD with stricture 05/16/2012   Headache(784.0)    HTN (hypertension) 08/09/2018   Hyperlipidemia    Hypertension    pt denies   Hypothyroidism    IFG (impaired fasting glucose)    Impaired fasting glucose 05/16/2012   Mycotic toenails 12/10/2010   1st toenail right-lasered nail 12/10/2010-present (last on 06/15/2012)    Neuropathic pain 06/30/2016   Obesity    Personal history of colonic polyps 07/30/2015   SSA   PONV (postoperative nausea and vomiting)    Positive H. pylori test    Thyroid nodule 05/16/2012   Benign, 2013, [place]    Urge incontinence 07/05/2018   Venous insufficiency of left leg 03/29/2016   Past Surgical History:  Procedure Laterality Date   ABDOMINAL HYSTERECTOMY     APPENDECTOMY     COLONOSCOPY     ESOPHAGOGASTRODUODENOSCOPY     EXCISION HAGLUND'S DEFORMITY WITH ACHILLES TENDON REPAIR Right 06/07/2013   Procedure: RIGHT ACHILLES DEBRIDEMENT; [id number] EXCISION;  Surgeon: Wylene Simmer, MD;  Location: [place];  Service: Orthopedics;  Laterality: Right;   EXCISION OF SKIN TAG  11/18/2011   Procedure: EXCISION OF SKIN TAG;  Surgeon: Leighton Ruff, MD;  Location: [facility];  Service: General;  Laterality: N/A;  posterior   GASTROCNEMIUS RECESSION Right 06/07/2013   Procedure: GASTROCNEMIUS RECESSION;  Surgeon: Wylene Simmer, MD;  Location: [place];  Service: Orthopedics;  Laterality: Right;   HEMORRHOID BANDING     HEMORRHOID SURGERY  11/18/2011   Procedure: HEMORRHOIDECTOMY;  Surgeon: Leighton Ruff, MD;  Location: [facility];  Service: General;;   LEFT OOPHORECTOMY     LUNG LOBECTOMY  12/2011   [place] removed for carcinoid   PLANTAR FASCIA SURGERY     rectal fissure repair     REPLACEMENT TOTAL KNEE BILATERAL  2022   THYROID SURGERY     nodule and partial thyroidectomy   TONSILLECTOMY     Social History   Social History Narrative   She is married, she is a homemaker, she has 1 son who is a Sport and exercise psychologist, he is an Engineer, mining in [place]   Very occasional alcohol never smoker no tobacco no drug use   family history includes Breast cancer in her maternal grandmother; Cancer in her maternal grandmother; Heart disease in her maternal grandfather; Kidney cancer in her  father; Pulmonary embolism in her mother.   Review of Systems As per HPI + review of urology notes - stress and urge urinary incontinence on Rx therapy  Objective:   Physical Exam '@BP'$  (!) 152/92   Pulse 84   Ht '5\' 6"'$  (1.676 m)   Wt 259 lb (117.5 kg)   BMI 41.80 kg/m @  General:  NAD obese Eyes:   anicteric Lungs:  clear Heart::  S1S2 no rubs, murmurs or gallops Abdomen:  soft and mildly tender epigastrium, BS+ Ext:   no edema, cyanosis or clubbing    Data Reviewed:  See  HPI

## 2021-12-22 NOTE — Patient Instructions (Signed)
Your provider has requested that you go to the basement level for lab work before leaving today. Press "B" on the elevator. The lab is located at the first door on the left as you exit the elevator.  We will be in touch with results and plans.  Due to recent changes in healthcare laws, you may see the results of your imaging and laboratory studies on MyChart before your provider has had a chance to review them.  We understand that in some cases there may be results that are confusing or concerning to you. Not all laboratory results come back in the same time frame and the provider may be waiting for multiple results in order to interpret others.  Please give [place] 48 hours in order for your provider to thoroughly review all the results before contacting the office for clarification of your results.    Hold your Zetia per Dr Carlean Purl.   I appreciate the opportunity to care for you. Silvano Rusk, MD

## 2021-12-25 ENCOUNTER — Telehealth: Payer: Self-pay | Admitting: Internal Medicine

## 2021-12-25 NOTE — Telephone Encounter (Signed)
Patient called to follow up on a previous she call she said she placed yesterday regarding lab results but has not heard from anyone. I advised her the provider made his recommendation after hours yesterday and she should be hearing from a nurse today to go over them with her. She is highly concerned.

## 2021-12-25 NOTE — Telephone Encounter (Signed)
Spoke with Pt: Documented in result notes: Pt verbalized understanding with all questions answered.

## 2022-01-08 ENCOUNTER — Other Ambulatory Visit: Payer: Self-pay | Admitting: Family Medicine

## 2022-01-08 DIAGNOSIS — Z0000 Encounter for general adult medical examination without abnormal findings: Secondary | ICD-10-CM

## 2022-01-20 ENCOUNTER — Ambulatory Visit
Admission: RE | Admit: 2022-01-20 | Discharge: 2022-01-20 | Disposition: A | Discharge status: Home / Self Care | Payer: Medicare Other | Source: Ambulatory Visit | Attending: Family Medicine | Admitting: Family Medicine

## 2022-01-20 DIAGNOSIS — Z1231 Encounter for screening mammogram for malignant neoplasm of breast: Secondary | ICD-10-CM

## 2022-01-21 ENCOUNTER — Other Ambulatory Visit: Payer: Self-pay | Admitting: Family Medicine

## 2022-01-21 DIAGNOSIS — Z1231 Encounter for screening mammogram for malignant neoplasm of breast: Secondary | ICD-10-CM

## 2022-03-02 ENCOUNTER — Other Ambulatory Visit (INDEPENDENT_AMBULATORY_CARE_PROVIDER_SITE_OTHER): Payer: Medicare Other

## 2022-03-02 ENCOUNTER — Encounter: Payer: Self-pay | Admitting: Internal Medicine

## 2022-03-02 ENCOUNTER — Ambulatory Visit (INDEPENDENT_AMBULATORY_CARE_PROVIDER_SITE_OTHER): Payer: Medicare Other | Admitting: Internal Medicine

## 2022-03-02 VITALS — BP 132/78 | HR 84 | Ht 68.0 in | Wt 265.0 lb

## 2022-03-02 DIAGNOSIS — R10816 Epigastric abdominal tenderness: Secondary | ICD-10-CM

## 2022-03-02 DIAGNOSIS — R194 Change in bowel habit: Secondary | ICD-10-CM | POA: Diagnosis not present

## 2022-03-02 DIAGNOSIS — R1013 Epigastric pain: Secondary | ICD-10-CM

## 2022-03-02 LAB — BUN: BUN: 15 mg/dL (ref 6–23)

## 2022-03-02 LAB — CREATININE, SERUM: Creatinine, Ser: 0.74 mg/dL (ref 0.40–1.20)

## 2022-03-02 NOTE — Patient Instructions (Addendum)
You have been scheduled for a CT scan of the abdomen and pelvis at [facility], 1st floor Radiology. You are scheduled on 03/16/2022 at 4:30pm. You should arrive at 2:30pm for your appointment time.  1) Do not eat anything after 12:30pm (4 hours prior to your test)    You may take any medications as prescribed with a small amount of water, if necessary. If you take any of the following medications: METFORMIN, GLUCOPHAGE, GLUCOVANCE, AVANDAMET, RIOMET, FORTAMET, [place] MET, JANUMET, GLUMETZA or METAGLIP, you MAY be asked to HOLD this medication 48 hours AFTER the exam.   The purpose of you drinking the oral contrast is to aid in the visualization of your intestinal tract. The contrast solution may cause some diarrhea. Depending on your individual set of symptoms, you may also receive an intravenous injection of x-ray contrast/dye. Plan on being at [facility] for 45 minutes or longer, depending on the type of exam you are having performed.   If you have any questions regarding your exam or if you need to reschedule, you may call [facility] at [phone] between the hours of 8:00 am and 5:00 pm, Monday-Friday.     Your provider has requested that you go to the basement level for lab work before leaving today. Press "B" on the elevator. The lab is located at the first door on the left as you exit the elevator.  _______________________________________________________  If your blood pressure at your visit was 140/90 or greater, please contact your primary care physician to follow up on this.  _______________________________________________________  If you are age 67 or older, your body mass index should be between 23-30. Your Body mass index is 40.29 kg/m. If this is out of the aforementioned range listed, please consider follow up with your Primary Care Provider.  If you are age 67 or younger, your body mass index should be between 19-25. Your Body mass index is 40.29 kg/m.  If this is out of the aformentioned range listed, please consider follow up with your Primary Care Provider.   ________________________________________________________  The [place] [facility] providers would like to encourage you to use [facility] to communicate with providers for non-urgent requests or questions.  Due to long hold times on the telephone, sending your provider a message by [facility] may be a faster and more efficient way to get a response.  Please allow 48 business hours for a response.  Please remember that this is for non-urgent requests.  _______________________________________________________ Thank you for trusting me with your gastrointestinal care!   Silvano Rusk MD

## 2022-03-02 NOTE — Progress Notes (Signed)
Debbie Miranda 67 y.o. 12-19-1955 [id number]  Assessment & Plan:   Encounter Diagnoses  Name Primary?   Epigastric pain Yes   Epigastric abdominal tenderness without rebound tenderness    Change in bowel habit     Evaluate these problems the CT abdomen and pelvis.  This could be a functional syndrome but need to exclude more serious problems.  I do not think repeating a colonoscopy is necessary right now but if the CT is unrevealing consider fecal calprotectin.  Hemoccult.  Also consider EGD.  Would probably pursue that first if CT negative.    Subjective:   Chief Complaint: Gastric pain and bowel habit change  HPI 67 year old white woman with a history of GERD, esophageal stricture dilation, IBS and diverticulitis as well as ischemic colitis who was seen in December with chronic epigastric pain problems that seem to be worse possibly related to Zetia.  She held Zetia but that did not make any difference.  Unfortunately that was not communicated to me, and I was not able to follow-up with her by phone as I had thought.  She is gracious and she made an appointment to come back to see me.  She is still having this dull epigastric ache that is there every day but maybe not all throughout the day.  She cannot identify any triggers such as movement eating etc.  There is no heartburn and dysphagia.  Several years ago she had EGD and stricture dilation and on her PPI she has done well.  She is having increased stools, she has multiple throughout the day which is a change.  Colonoscopy 2022 negative except for diverticulosis Wt Readings from Last 3 Encounters:  03/02/22 265 lb (120.2 kg)  12/22/21 259 lb (117.5 kg)  12/11/21 266 lb (120.7 kg)     Allergies  Allergen Reactions   Codeine Nausea And Vomiting   Rivaroxaban Rash   Current Meds  Medication Sig   Biotin 5000 MCG CAPS Take 5,000 mcg by mouth daily with breakfast.   Cholecalciferol (VITAMIN D-3) 25 MCG (1000 UT) CAPS Take  1,000 Units by mouth daily with breakfast.    cyclobenzaprine (FLEXERIL) 5 MG tablet TAKE 1 OR 2 TABLETS BY MOUTH AT BEDTIME AS NEEDED FOR INSOMNIA OR BACK SPASMS   Esomeprazole Magnesium (NEXIUM 24HR) 20 MG TBEC Take 20 mg by mouth daily.   ezetimibe (ZETIA) 10 MG tablet TAKE 1 TABLET BY MOUTH DAILY   levothyroxine (SYNTHROID) 125 MCG tablet TAKE ONE TABLET BY MOUTH EVERY MORNING   Methylcellulose, Laxative, (CITRUCEL) 500 MG TABS Take 500 mg by mouth daily.   Multiple Vitamins-Minerals (CENTRUM SILVER) tablet Take 1 tablet by mouth daily with breakfast.    rosuvastatin (CRESTOR) 40 MG tablet TAKE ONE (1) TABLET BY MOUTH EVERY DAY   Vaginal Lubricant (REPHRESH) GEL Place 1 applicator vaginally daily as needed (vaginal dryness).   Past Medical History:  Diagnosis Date   Acute ischemic colitis ([place]) 08/09/2018   Anal itching 10/27/2011   Arthritis    Cancer ([place])    lung-7years ago   Carcinoid tumor of lung 2013   Left   Cataract    beginning stages,bilateral   Contact lens/glasses fitting    wares contacts or glasses   Corn of toe 5th right 08/16/2012   Costochondritis    Diverticulitis 2012   Dysplastic nevi    Esophageal reflux    External hemorrhoids without mention of complication [id number]   GERD with stricture 05/16/2012   Headache(784.0)  HTN (hypertension) 08/09/2018   Hyperlipidemia    Hypertension    pt denies   Hypothyroidism    IFG (impaired fasting glucose)    Impaired fasting glucose 05/16/2012   Mycotic toenails 12/10/2010   1st toenail right-lasered nail 12/10/2010-present (last on 06/15/2012)   Neuropathic pain 06/30/2016   Obesity    Personal history of colonic polyps 07/30/2015   SSA   PONV (postoperative nausea and vomiting)    Positive H. pylori test    Thyroid nodule 05/16/2012   Benign, 2013, [place]    Urge incontinence 07/05/2018   Venous insufficiency of left leg 03/29/2016   Past Surgical History:  Procedure Laterality Date   ABDOMINAL HYSTERECTOMY      APPENDECTOMY     COLONOSCOPY     ESOPHAGOGASTRODUODENOSCOPY     EXCISION HAGLUND'S DEFORMITY WITH ACHILLES TENDON REPAIR Right 06/07/2013   Procedure: RIGHT ACHILLES DEBRIDEMENT; [id number] EXCISION;  Surgeon: Wylene Simmer, MD;  Location: [place];  Service: Orthopedics;  Laterality: Right;   EXCISION OF SKIN TAG  11/18/2011   Procedure: EXCISION OF SKIN TAG;  Surgeon: Leighton Ruff, MD;  Location: [facility];  Service: General;  Laterality: N/A;  posterior   GASTROCNEMIUS RECESSION Right 06/07/2013   Procedure: GASTROCNEMIUS RECESSION;  Surgeon: Wylene Simmer, MD;  Location: [place];  Service: Orthopedics;  Laterality: Right;   HEMORRHOID BANDING     HEMORRHOID SURGERY  11/18/2011   Procedure: HEMORRHOIDECTOMY;  Surgeon: Leighton Ruff, MD;  Location: [facility];  Service: General;;   LEFT OOPHORECTOMY     LUNG LOBECTOMY  12/2011   [place] removed for carcinoid   PLANTAR FASCIA SURGERY     rectal fissure repair     REPLACEMENT TOTAL KNEE BILATERAL  2022   THYROID SURGERY     nodule and partial thyroidectomy   TONSILLECTOMY     Social History   Social History Narrative   She is married, she is a homemaker, she has 1 son who is a Sport and exercise psychologist, he is an Engineer, mining in [place]   Very occasional alcohol never smoker no tobacco no drug use   family history includes Breast cancer in her maternal grandmother; Breast cancer (age of onset: 1) in her sister; Heart disease in her maternal grandfather; Kidney cancer in her father; Pulmonary embolism in her mother.   Review of Systems Chronic low back pain  Objective:   Physical Exam BP 132/78   Pulse 84   Ht 5' 8"$  (1.727 m)   Wt 265 lb (120.2 kg)   SpO2 97%   BMI 40.29 kg/m  NAD obese ww Abdomen obese soft and mild-mod tender epigastrium no guarding or rebound or mass

## 2022-03-08 ENCOUNTER — Ambulatory Visit (INDEPENDENT_AMBULATORY_CARE_PROVIDER_SITE_OTHER): Payer: Medicare Other | Admitting: Family Medicine

## 2022-03-08 ENCOUNTER — Encounter: Payer: Self-pay | Admitting: Family Medicine

## 2022-03-08 VITALS — BP 136/80 | HR 101 | Temp 98.4°F | Wt 262.3 lb

## 2022-03-08 DIAGNOSIS — J019 Acute sinusitis, unspecified: Secondary | ICD-10-CM

## 2022-03-08 MED ORDER — AMOXICILLIN-POT CLAVULANATE 875-125 MG PO TABS: 1.0000 | ORAL_TABLET | Freq: Two times a day (BID) | ORAL | 0 refills | Status: DC

## 2022-03-08 NOTE — Progress Notes (Signed)
   Subjective:    Patient ID: Debbie Miranda, female    DOB: Oct 24, 1955, 67 y.o.   MRN: [id number]  Sore Throat  This is a new (last week) problem. The maximum temperature recorded prior to her arrival was 100.4 - 100.9 F. The fever has been present for 1 to 2 days. Associated symptoms include congestion, coughing, ear pain and headaches. Treatments tried: mucinex. The treatment provided mild relief.  Patient has had several days of not feeling good started last week now progressing over the weekend with sinus pressure pain discomfort     Review of Systems  HENT:  Positive for congestion and ear pain.   Respiratory:  Positive for cough.   Neurological:  Positive for headaches.       Objective:   Physical Exam Gen-NAD not toxic TMS-normal bilateral T- normal no redness Chest-CTA respiratory rate normal no crackles CV RRR no murmur Skin-warm dry Neuro-grossly normal Maxillary sinus tenderness bilateral       Assessment & Plan:  Acute rhinosinusitis Preceding viral illness No sign of pneumonia or wheezing noted Antibiotics for the next 10 days If progressive troubles or problems notify us

## 2022-03-16 ENCOUNTER — Ambulatory Visit (HOSPITAL_COMMUNITY)
Admission: RE | Admit: 2022-03-16 | Discharge: 2022-03-16 | Disposition: A | Discharge status: Home / Self Care | Payer: Medicare Other | Source: Ambulatory Visit | Attending: Internal Medicine | Admitting: Internal Medicine

## 2022-03-16 DIAGNOSIS — R194 Change in bowel habit: Secondary | ICD-10-CM

## 2022-03-16 DIAGNOSIS — R1013 Epigastric pain: Secondary | ICD-10-CM | POA: Insufficient documentation

## 2022-03-16 DIAGNOSIS — R10816 Epigastric abdominal tenderness: Secondary | ICD-10-CM | POA: Insufficient documentation

## 2022-03-16 MED ORDER — IOHEXOL 300 MG/ML  SOLN
100.0000 mL | Freq: Once | INTRAMUSCULAR | Status: AC | PRN
  Administered 2022-03-16: 100 mL via INTRAVENOUS

## 2022-03-16 MED ORDER — SODIUM CHLORIDE (PF) 0.9 % IJ SOLN
INTRAMUSCULAR | Status: AC
  Filled 2022-03-16: qty 50

## 2022-03-16 MED ORDER — IOHEXOL 9 MG/ML PO SOLN
1000.0000 mL | ORAL | Status: AC
  Administered 2022-03-16: 1000 mL via ORAL

## 2022-03-17 ENCOUNTER — Other Ambulatory Visit: Payer: Self-pay

## 2022-03-17 DIAGNOSIS — R1013 Epigastric pain: Secondary | ICD-10-CM

## 2022-03-23 ENCOUNTER — Encounter: Payer: Self-pay | Admitting: Certified Registered Nurse Anesthetist

## 2022-03-26 ENCOUNTER — Ambulatory Visit (AMBULATORY_SURGERY_CENTER): Payer: Medicare Other | Admitting: Internal Medicine

## 2022-03-26 ENCOUNTER — Encounter: Payer: Self-pay | Admitting: Internal Medicine

## 2022-03-26 VITALS — BP 135/98 | HR 97 | Temp 97.5°F | Resp 23 | Ht 68.0 in | Wt 265.0 lb

## 2022-03-26 DIAGNOSIS — K317 Polyp of stomach and duodenum: Secondary | ICD-10-CM | POA: Diagnosis not present

## 2022-03-26 DIAGNOSIS — R1013 Epigastric pain: Secondary | ICD-10-CM

## 2022-03-26 DIAGNOSIS — K3189 Other diseases of stomach and duodenum: Secondary | ICD-10-CM

## 2022-03-26 DIAGNOSIS — K2950 Unspecified chronic gastritis without bleeding: Secondary | ICD-10-CM

## 2022-03-26 MED ORDER — DICYCLOMINE HCL 20 MG PO TABS: 20.0000 mg | ORAL_TABLET | Freq: Three times a day (TID) | ORAL | 0 refills | Status: DC

## 2022-03-26 MED ORDER — SODIUM CHLORIDE 0.9 % IV SOLN: 500.0000 mL | INTRAVENOUS | Status: DC

## 2022-03-26 NOTE — Progress Notes (Signed)
Called to room to assist during endoscopic procedure.  Patient ID and intended procedure confirmed with present staff. Received instructions for my participation in the procedure from the performing physician.  

## 2022-03-26 NOTE — Progress Notes (Signed)
1530 Robinul 0.1 mg IV given due large amount of secretions upon assessment.  MD made aware, vss  

## 2022-03-26 NOTE — Patient Instructions (Addendum)
There was inflammation in the stomach - similar to 2020 and also now numerous tiny stomach polyps that look benign (these often develop from the use of medications like esomeprazole or Nexium and are usually innocent).  I took biopsies and will let you know results and recommendations. In the meantime try dicyclomine before meals. Rx sent.  I appreciate the opportunity to care for you. Gatha Mayer, MD, FACG     YOU HAD AN ENDOSCOPIC PROCEDURE TODAY AT [facility]:   Refer to the procedure report that was given to you for any specific questions about what was found during the examination.  If the procedure report does not answer your questions, please call your gastroenterologist to clarify.  If you requested that your care partner not be given the details of your procedure findings, then the procedure report has been included in a sealed envelope for you to review at your convenience later.  YOU SHOULD EXPECT: Some feelings of bloating in the abdomen. Passage of more gas than usual.  Walking can help get rid of the air that was put into your GI tract during the procedure and reduce the bloating. If you had a lower endoscopy (such as a colonoscopy or flexible sigmoidoscopy) you may notice spotting of blood in your stool or on the toilet paper. If you underwent a bowel prep for your procedure, you may not have a normal bowel movement for a few days.  Please Note:  You might notice some irritation and congestion in your nose or some drainage.  This is from the oxygen used during your procedure.  There is no need for concern and it should clear up in a day or so.  SYMPTOMS TO REPORT IMMEDIATELY:  Following upper endoscopy (EGD)  Vomiting of blood or coffee ground material  New chest pain or pain under the shoulder blades  Painful or persistently difficult swallowing  New shortness of breath  Fever of 100F or higher  Black, tarry-looking stools  For urgent or emergent  issues, a gastroenterologist can be reached at any hour by calling [phone]. Do not use MyChart messaging for urgent concerns.    DIET:  We do recommend a small meal at first, but then you may proceed to your regular diet.  Drink plenty of fluids but you should avoid alcoholic beverages for 24 hours.  ACTIVITY:  You should plan to take it easy for the rest of today and you should NOT DRIVE or use heavy machinery until tomorrow (because of the sedation medicines used during the test).    FOLLOW UP: Our staff will call the number listed on your records the next business day following your procedure.  We will call around 7:15- 8:00 am to check on you and address any questions or concerns that you may have regarding the information given to you following your procedure. If we do not reach you, we will leave a message.     If any biopsies were taken you will be contacted by phone or by letter within the next 1-3 weeks.  Please call us at [phone] if you have not heard about the biopsies in 3 weeks.    SIGNATURES/CONFIDENTIALITY: You and/or your care partner have signed paperwork which will be entered into your electronic medical record.  These signatures attest to the fact that that the information above on your After Visit Summary has been reviewed and is understood.  Full responsibility of the confidentiality of this discharge information lies  with you and/or your care-partner.

## 2022-03-26 NOTE — Progress Notes (Signed)
History and Physical Interval Note:  03/26/2022 3:24 PM  Debbie Miranda  has presented today for endoscopic procedure(s), with the diagnosis of  Encounter Diagnosis  Name Primary?   Epigastric pain Yes  .  The various methods of evaluation and treatment have been discussed with the patient and/or family. After consideration of risks, benefits and other options for treatment, the patient has consented to  the endoscopic procedure(s).   The patient's history has been reviewed, patient examined, no change in status, stable for endoscopic procedure(s).  I have reviewed the patient's chart and labs.  Questions were answered to the patient's satisfaction.     Gatha Mayer, MD, [facility]

## 2022-03-26 NOTE — Progress Notes (Signed)
Report given to PACU, vss 

## 2022-03-26 NOTE — Op Note (Addendum)
[facility] Patient Name: Debbie Miranda Procedure Date: 03/26/2022 2:41 PM MRN: [id number] Endoscopist: Gatha Mayer , MD, [id number] Age: 67 Referring MD:  Date of Birth: September 24, 1955 Gender: Female Account #: [id number] Procedure:                Upper GI endoscopy Indications:              Epigastric abdominal pain Medicines:                Monitored Anesthesia Care Procedure:                Pre-Anesthesia Assessment:                           - Prior to the procedure, a History and Physical                            was performed, and patient medications and                            allergies were reviewed. The patient's tolerance of                            previous anesthesia was also reviewed. The risks                            and benefits of the procedure and the sedation                            options and risks were discussed with the patient.                            All questions were answered, and informed consent                            was obtained. Prior Anticoagulants: The patient has                            taken no anticoagulant or antiplatelet agents. ASA                            Grade Assessment: III - A patient with severe                            systemic disease. After reviewing the risks and                            benefits, the patient was deemed in satisfactory                            condition to undergo the procedure.                           After obtaining informed consent, the endoscope was  passed under direct vision. Throughout the                            procedure, the patient's blood pressure, pulse, and                            oxygen saturations were monitored continuously. The                            GIF [id number] was introduced through the                            mouth, and advanced to the second part of duodenum.                            The upper GI endoscopy  was accomplished without                            difficulty. The patient tolerated the procedure                            well. Scope In: Scope Out: Findings:                 The gastroesophageal flap valve was visualized                            endoscopically and classified as Hill Grade II                            (fold present, opens with respiration).                           Multiple diminutive sessile polyps with no stigmata                            of recent bleeding were found in the gastric fundus                            and in the gastric body. Biopsies were taken with a                            cold forceps for histology. Verification of patient                            identification for the specimen was done. Estimated                            blood loss was minimal.                           Patchy moderate inflammation characterized by                            congestion (edema), erythema, friability and  granularity was found in the gastric antrum.                            Biopsies were taken with a cold forceps for                            histology. Verification of patient identification                            for the specimen was done. Estimated blood loss was                            minimal.                           The exam was otherwise without abnormality.                           The cardia and gastric fundus were normal on                            retroflexion. Complications:            No immediate complications. Estimated Blood Loss:     Estimated blood loss was minimal. Impression:               - Gastroesophageal flap valve classified as Hill                            Grade II (fold present, opens with respiration).                           - Multiple gastric polyps. Biopsied. Look like                            innocent fundic gland polyps                           - Gastritis. Biopsied.  Seems similar to in the past                           - The examination was otherwise normal. Recommendation:           - Patient has a contact number available for                            emergencies. The signs and symptoms of potential                            delayed complications were discussed with the                            patient. Return to normal activities tomorrow.                            Written discharge instructions were provided to the  patient.                           - Resume previous diet.                           - Continue present medications.                           - Await pathology results. consider anti-spasmodic                            agents, TCA - has had rx for hyoscyamine in past                            for IBS (and is having IBS-like sxs also) - will                            try dicyclomie 20 mg ac for now Gatha Mayer, MD 03/26/2022 3:49:05 PM This report has been signed electronically.

## 2022-03-26 NOTE — Progress Notes (Signed)
Pt states no changes to health hx since previsit ?

## 2022-03-29 ENCOUNTER — Telehealth: Payer: Self-pay

## 2022-03-29 NOTE — Telephone Encounter (Signed)
  Follow up Call-     03/26/2022    2:01 PM 10/22/2020    1:37 PM  Call back number  Post procedure Call Back phone  # [phone]  Permission to leave phone message Yes Yes     Patient questions:  Do you have a fever, pain , or abdominal swelling? No. Pain Score  0 *  Have you tolerated food without any problems? Yes.    Have you been able to return to your normal activities? Yes.    Do you have any questions about your discharge instructions: Diet   No. Medications  No. Follow up visit  No.  Do you have questions or concerns about your Care? No.  Actions: * If pain score is 4 or above: No action needed, pain <4.

## 2022-04-03 ENCOUNTER — Other Ambulatory Visit: Payer: Self-pay | Admitting: Family Medicine

## 2022-04-03 DIAGNOSIS — Z0000 Encounter for general adult medical examination without abnormal findings: Secondary | ICD-10-CM

## 2022-04-05 ENCOUNTER — Telehealth: Payer: Self-pay

## 2022-04-05 ENCOUNTER — Telehealth: Payer: Self-pay | Admitting: Internal Medicine

## 2022-04-05 NOTE — Telephone Encounter (Signed)
Pt stated that she is requesting results from her pathology and recommendations from Dr. Lyndel Safe. Pt was notified that it takes several days for the pathology to be processed and then the Dr. Has to review the path. Pt was notified that as soon as Dr. Lyndel Safe reviews the pathology then we will reach out to her and make her aware of the results and recommendations. Pt verbalized understanding with all questions answered.

## 2022-04-06 ENCOUNTER — Other Ambulatory Visit: Payer: Self-pay | Admitting: Internal Medicine

## 2022-04-06 MED ORDER — DICYCLOMINE HCL 20 MG PO TABS: 20.0000 mg | ORAL_TABLET | Freq: Three times a day (TID) | ORAL | 5 refills | Status: DC

## 2022-04-06 NOTE — Telephone Encounter (Signed)
Called patient and left message on cell, unable to reach by home number.  Need to discuss biopsy results (nothing significant) and get an update on her symptoms.  I have asked her to call back but I also said I would send a MyChart message.

## 2022-04-07 NOTE — Telephone Encounter (Signed)
Will forwarded to Dr. Carlean Purl. I got this message by mistake RG

## 2022-04-07 NOTE — Telephone Encounter (Signed)
See other notes - I communicated with her

## 2022-04-08 NOTE — Telephone Encounter (Signed)
Noted  

## 2022-05-10 ENCOUNTER — Ambulatory Visit (INDEPENDENT_AMBULATORY_CARE_PROVIDER_SITE_OTHER): Payer: Medicare Other | Admitting: Family Medicine

## 2022-05-10 ENCOUNTER — Ambulatory Visit (HOSPITAL_COMMUNITY)
Admission: RE | Admit: 2022-05-10 | Discharge: 2022-05-10 | Disposition: A | Discharge status: Home / Self Care | Payer: Medicare Other | Source: Ambulatory Visit | Attending: Family Medicine | Admitting: Family Medicine

## 2022-05-10 VITALS — BP 115/81 | HR 86 | Temp 98.4°F | Wt 235.2 lb

## 2022-05-10 DIAGNOSIS — J4521 Mild intermittent asthma with (acute) exacerbation: Secondary | ICD-10-CM | POA: Diagnosis present

## 2022-05-10 DIAGNOSIS — J019 Acute sinusitis, unspecified: Secondary | ICD-10-CM

## 2022-05-10 DIAGNOSIS — J40 Bronchitis, not specified as acute or chronic: Secondary | ICD-10-CM

## 2022-05-10 MED ORDER — FLUTICASONE-SALMETEROL 230-21 MCG/ACT IN AERO: 2.0000 | INHALATION_SPRAY | Freq: Two times a day (BID) | RESPIRATORY_TRACT | 11 refills | Status: DC

## 2022-05-10 MED ORDER — ALBUTEROL SULFATE HFA 108 (90 BASE) MCG/ACT IN AERS: 2.0000 | INHALATION_SPRAY | Freq: Four times a day (QID) | RESPIRATORY_TRACT | 2 refills | Status: DC | PRN

## 2022-05-10 MED ORDER — AMOXICILLIN-POT CLAVULANATE 875-125 MG PO TABS: 1.0000 | ORAL_TABLET | Freq: Two times a day (BID) | ORAL | 0 refills | Status: DC

## 2022-05-10 MED ORDER — AZITHROMYCIN 250 MG PO TABS: ORAL_TABLET | ORAL | 0 refills | Status: DC

## 2022-05-10 NOTE — Patient Instructions (Signed)
As for the sinus congestion I would recommend saline nasal spray as needed May  use Astelin nasal spray to see if it might help with congestion I do not recommend taking any type of decongestant tablets   Please follow-up toward the end of May Feel free to send [place] a MyChart message to give [place] update

## 2022-05-10 NOTE — Progress Notes (Signed)
   Subjective:    Patient ID: Debbie Miranda, female    DOB: 1955-08-01, 67 y.o.   MRN: [id number]  Sore Throat  Episode onset: wednesday. The maximum temperature recorded prior to her arrival was 101 - 101.9 F. Associated symptoms include congestion, coughing, headaches and a hoarse voice.  Patient never got over her previous illness having a lot of sore throat coughing denies vomiting or diarrhea.  Is having some wheezing.  Energy level subpar.    Review of Systems  HENT:  Positive for congestion and hoarse voice.   Respiratory:  Positive for cough.   Neurological:  Positive for headaches.       Objective:   Physical Exam  Wheezing noted bilateral HEENT benign subjective discomfort in the maxillary sinuses eardrums normal  Patient not respiratory distress    Assessment & Plan:  Persistent respiratory illness Reactive airway Reinitiate albuterol as needed Steroid inhaler with long-acting bronchodilator during flareup Hold off on prednisone Julli antibiotics for sinuses to cover bronchitis as well doubt pneumonia but we will go ahead and get a chest x-ray

## 2022-05-11 ENCOUNTER — Telehealth: Payer: Self-pay | Admitting: Family Medicine

## 2022-05-11 ENCOUNTER — Other Ambulatory Visit: Payer: Self-pay | Admitting: Family Medicine

## 2022-05-11 NOTE — Telephone Encounter (Signed)
Patient is requesting prescription for symbicort and breo sent to [facility] [place]

## 2022-05-12 NOTE — Telephone Encounter (Signed)
I called the patient regarding this she states she is unaware of any message she had regarding this

## 2022-05-27 ENCOUNTER — Telehealth: Payer: Self-pay | Admitting: *Deleted

## 2022-05-27 NOTE — Telephone Encounter (Signed)
PA for Advair denied by insurance- med non formulary- preferred formulary alternatives:    SYMBICORT or BREO ELLIPTA   [facility]

## 2022-05-29 ENCOUNTER — Other Ambulatory Visit: Payer: Self-pay | Admitting: Family Medicine

## 2022-05-29 MED ORDER — BUDESONIDE-FORMOTEROL FUMARATE 80-4.5 MCG/ACT IN AERO: 2.0000 | INHALATION_SPRAY | Freq: Two times a day (BID) | RESPIRATORY_TRACT | 3 refills | Status: DC

## 2022-05-29 NOTE — Telephone Encounter (Signed)
Symbicort was sent in-will be surprising if insurance covers that

## 2022-06-02 ENCOUNTER — Encounter: Payer: Self-pay | Admitting: Family Medicine

## 2022-06-02 ENCOUNTER — Ambulatory Visit (INDEPENDENT_AMBULATORY_CARE_PROVIDER_SITE_OTHER): Payer: Medicare Other | Admitting: Family Medicine

## 2022-06-02 VITALS — BP 126/72 | HR 80 | Temp 98.1°F | Ht 68.0 in | Wt 230.0 lb

## 2022-06-02 DIAGNOSIS — J019 Acute sinusitis, unspecified: Secondary | ICD-10-CM

## 2022-06-02 MED ORDER — CEFPROZIL 500 MG PO TABS
500.0000 mg | ORAL_TABLET | Freq: Two times a day (BID) | ORAL | 0 refills | Status: DC
Start: 2022-06-02 — End: 2023-04-03

## 2022-06-02 NOTE — Progress Notes (Unsigned)
   Subjective:    Patient ID: Debbie Miranda, female    DOB: Jan 10, 1956, 67 y.o.   MRN: [id number]  HPI 3 week follow up. Pt states she still has a productive cough, mild headache and some ear pain with dizziness. Very nice patient She has some ongoing troubles with head congestion and sinus pressure She has been on double antibiotics along with a prolonged course of Augmentin She relates some sinus pressure in the ethmoid sinus region She denies any chest pressure tightness wheezing She was on a specific inhaler we sent in but pharmacy sent this notification that that particular inhaler was not covered and requested a different inhaler we sent in a different inhaler Then the patient went to the pharmacy and was not understanding why a new inhaler was prescribed did not get it stated she already had the previous 1 So another words it seems like there was some electronic messaging issues regarding her inhalers and it sounds like she got her original 1 and did well There is no need to continue the Symbicort She denies wheezing chest tightness pressure pain discomfort denies high fever chills sweats Review of Systems     Objective:   Physical Exam Eardrums are normal lungs clear no crackles heart is regular Sinuses minimal tenderness in the ethmoid sinus      Assessment & Plan:  Patient has some persistent sinus symptoms but I do not necessarily feel that this is a persistent infection but more so congestion I recommended allergy tablet and Flonase with Astelin Patient does not feel she has allergies but these sprays may help her with congestion She is going on a trip to [place] I gave her a prescription of antibiotics to cover the possibility of a sinus infection should be symptoms get worse she is to notify us if any problems May use nasal decongestant spray before going on the plane both going to [place] and coming back If she has any questions concerns issues to reach out to [place]  If she  continues to have a lot of sinus congestion it just does not want to go away over the course of the next several months/weeks we can help set her up with ENT for them to do fiberoptic inspection of the nasal passages for hypertrophy of the turbinates, polyps, or ostial opening stenosis

## 2022-06-03 NOTE — Telephone Encounter (Signed)
Patient notified

## 2022-06-03 NOTE — Telephone Encounter (Signed)
Left message to return call 

## 2022-06-06 ENCOUNTER — Encounter: Payer: Self-pay | Admitting: Family Medicine

## 2022-06-08 ENCOUNTER — Other Ambulatory Visit: Payer: Self-pay | Admitting: Family Medicine

## 2022-06-08 DIAGNOSIS — Z1231 Encounter for screening mammogram for malignant neoplasm of breast: Secondary | ICD-10-CM

## 2022-06-08 MED ORDER — AMOXICILLIN-POT CLAVULANATE 875-125 MG PO TABS: 1.0000 | ORAL_TABLET | Freq: Two times a day (BID) | ORAL | 0 refills | Status: DC

## 2022-07-06 ENCOUNTER — Other Ambulatory Visit: Payer: Self-pay | Admitting: Family Medicine

## 2022-07-06 DIAGNOSIS — Z0000 Encounter for general adult medical examination without abnormal findings: Secondary | ICD-10-CM

## 2022-07-30 ENCOUNTER — Other Ambulatory Visit: Payer: Self-pay | Admitting: Family Medicine

## 2022-09-23 ENCOUNTER — Other Ambulatory Visit: Payer: Self-pay | Admitting: Family Medicine

## 2022-09-23 DIAGNOSIS — Z0000 Encounter for general adult medical examination without abnormal findings: Secondary | ICD-10-CM

## 2022-10-18 ENCOUNTER — Other Ambulatory Visit: Payer: Self-pay | Admitting: Family Medicine

## 2022-12-13 ENCOUNTER — Other Ambulatory Visit: Payer: Self-pay | Admitting: Family Medicine

## 2022-12-13 DIAGNOSIS — Z0000 Encounter for general adult medical examination without abnormal findings: Secondary | ICD-10-CM

## 2022-12-25 ENCOUNTER — Other Ambulatory Visit: Payer: Self-pay | Admitting: Family Medicine

## 2023-01-10 ENCOUNTER — Telehealth: Payer: Self-pay

## 2023-01-10 NOTE — Telephone Encounter (Signed)
Pharmacy Patient Advocate Encounter   Received notification from CoverMyMeds that prior authorization for Dicyclomine 20 mg tablets is required/requested.   Insurance verification completed.   The patient is insured through [place] .   Per test claim: PA required; PA submitted to above mentioned insurance via CoverMyMeds Key/confirmation #/EOC [id number] Status is pending

## 2023-01-11 NOTE — Telephone Encounter (Signed)
 Pharmacy Patient Advocate Encounter  Received notification from [facility] that Prior Authorization for Dicyclomine 20mg  tablets has been APPROVED from 01/10/2023 to 01/11/2024

## 2023-01-19 ENCOUNTER — Other Ambulatory Visit: Payer: Self-pay | Admitting: Family Medicine

## 2023-01-25 ENCOUNTER — Ambulatory Visit
Admission: RE | Admit: 2023-01-25 | Discharge: 2023-01-25 | Disposition: A | Discharge status: Home / Self Care | Payer: Medicare Other | Source: Ambulatory Visit | Attending: Family Medicine | Admitting: Family Medicine

## 2023-01-25 DIAGNOSIS — Z1231 Encounter for screening mammogram for malignant neoplasm of breast: Secondary | ICD-10-CM

## 2023-03-28 ENCOUNTER — Other Ambulatory Visit: Payer: Self-pay | Admitting: Family Medicine

## 2023-03-28 DIAGNOSIS — Z0000 Encounter for general adult medical examination without abnormal findings: Secondary | ICD-10-CM

## 2023-04-01 ENCOUNTER — Ambulatory Visit (INDEPENDENT_AMBULATORY_CARE_PROVIDER_SITE_OTHER): Payer: Medicare Other

## 2023-04-01 VITALS — Ht 68.0 in | Wt 234.0 lb

## 2023-04-01 DIAGNOSIS — Z0000 Encounter for general adult medical examination without abnormal findings: Secondary | ICD-10-CM | POA: Diagnosis not present

## 2023-04-01 DIAGNOSIS — Z780 Asymptomatic menopausal state: Secondary | ICD-10-CM

## 2023-04-01 NOTE — Progress Notes (Signed)
 Because this visit was a virtual/telehealth visit,  certain criteria was not obtained, such a blood pressure, CBG if applicable, and timed get up and go. Any medications not marked as "taking" were not mentioned during the medication reconciliation part of the visit. Any vitals not documented were not able to be obtained due to this being a telehealth visit or patient was unable to self-report a recent blood pressure reading due to a lack of equipment at home via telehealth. Vitals that have been documented are verbally provided by the patient.   Follow up appointment was scheduled for patient for April 19, 2023 at 11:00am Subjective:   Debbie Miranda is a 68 y.o. who presents for a Medicare Wellness preventive visit.  Visit Complete: Virtual I connected with  Debbie Miranda on 04/01/23 by a audio enabled telemedicine application and verified that I am speaking with the correct person using two identifiers.  Patient Location: Home  Provider Location: Home Office  I discussed the limitations of evaluation and management by telemedicine. The patient expressed understanding and agreed to proceed.  Vital Signs: Because this visit was a virtual/telehealth visit, some criteria may be missing or patient reported. Any vitals not documented were not able to be obtained and vitals that have been documented are patient reported.  VideoDeclined- This patient declined Librarian, academic. Therefore the visit was completed with audio only.  Persons Participating in Visit: Patient.  AWV Questionnaire: No: Patient Medicare AWV questionnaire was not completed prior to this visit.  Cardiac Risk Factors include: advanced age (>6men, >28 women);hypertension;dyslipidemia;obesity (BMI >30kg/m2)     Objective:    Today's Vitals   04/01/23 1425 04/01/23 1433  Weight: 234 lb (106.1 kg)   Height: 5\' 8"  (1.727 m)   PainSc:  2    Body mass index is 35.58 kg/m.     04/01/2023     2:40 PM 12/11/2021    1:38 PM 10/22/2020    1:41 PM 08/09/2018    4:00 PM 05/31/2013    3:16 PM 11/16/2011    9:17 AM  Advanced Directives  Does Patient Have a Medical Advance Directive? Yes Yes Yes No Patient has advance directive, copy not in chart Patient has advance directive, copy not in chart  Type of Advance Directive Healthcare Power of [name];Living will Healthcare Power of [employer] of [place];Living will  Living will Healthcare Power of [place];Living will  Does patient want to make changes to medical advance directive?  No - Patient declined No - Patient declined  No change requested   Copy of Healthcare Power of Attorney in Chart? No - copy requested No - copy requested No - copy requested     Would patient like information on creating a medical advance directive?    No - Patient declined      Current Medications (verified) Outpatient Encounter Medications as of 04/01/2023  Medication Sig   Biotin 5000 MCG CAPS Take 5,000 mcg by mouth daily with breakfast.   Cholecalciferol (VITAMIN D-3) 25 MCG (1000 UT) CAPS Take 1,000 Units by mouth daily with breakfast.    cyclobenzaprine (FLEXERIL) 5 MG tablet TAKE 1 OR 2 TABLETS BY MOUTH AT BEDTIME AS NEEDED FOR INSOMNIA OR BACK SPASMS   dicyclomine (BENTYL) 20 MG tablet Take 1 tablet (20 mg total) by mouth 3 (three) times daily before meals.   Esomeprazole Magnesium (NEXIUM 24HR) 20 MG TBEC Take 20 mg by mouth daily.   ezetimibe (ZETIA) 10 MG tablet TAKE 1  TABLET BY MOUTH DAILY   levothyroxine (SYNTHROID) 125 MCG tablet TAKE ONE TABLET BY MOUTH EVERY MORNING   Methylcellulose, Laxative, (CITRUCEL) 500 MG TABS Take 500 mg by mouth daily.   Multiple Vitamins-Minerals (CENTRUM SILVER) tablet Take 1 tablet by mouth daily with breakfast.    rosuvastatin (CRESTOR) 40 MG tablet TAKE ONE (1) TABLET BY MOUTH EVERY DAY   albuterol (VENTOLIN HFA) 108 (90 Base) MCG/ACT inhaler Inhale 2 puffs into the lungs every 6 (six) hours as  needed for wheezing or shortness of breath. (Patient not taking: Reported on 04/01/2023)   amoxicillin-clavulanate (AUGMENTIN) 875-125 MG tablet Take 1 tablet by mouth 2 (two) times daily. (Patient not taking: Reported on 04/01/2023)   cefPROZIL (CEFZIL) 500 MG tablet Take 1 tablet (500 mg total) by mouth 2 (two) times daily. (Patient not taking: Reported on 04/01/2023)   Vaginal Lubricant (REPHRESH) GEL Place 1 applicator vaginally daily as needed (vaginal dryness). (Patient not taking: Reported on 04/01/2023)   No facility-administered encounter medications on file as of 04/01/2023.    Allergies (verified) Codeine and Rivaroxaban   History: Past Medical History:  Diagnosis Date   Acute ischemic colitis (HCC) 08/09/2018   Anal itching 10/27/2011   Arthritis    Cancer (HCC)    lung-7years ago   Carcinoid tumor of lung 2013   Left   Cataract    beginning stages,bilateral   Contact lens/glasses fitting    wares contacts or glasses   Corn of toe 5th right 08/16/2012   Costochondritis    Diverticulitis 2012   Dysplastic nevi    Esophageal reflux    External hemorrhoids without mention of complication 2007   GERD with stricture 05/16/2012   Headache(784.0)    HTN (hypertension) 08/09/2018   Hyperlipidemia    Hypertension    pt denies   Hypothyroidism    IFG (impaired fasting glucose)    Impaired fasting glucose 05/16/2012   Mycotic toenails 12/10/2010   1st toenail right-lasered nail 12/10/2010-present (last on 06/15/2012)   Neuropathic pain 06/30/2016   Obesity    Personal history of colonic polyps 07/30/2015   SSA   PONV (postoperative nausea and vomiting)    Positive H. pylori test    Thyroid nodule 05/16/2012   Benign, 2013, [place]    Urge incontinence 07/05/2018   Venous insufficiency of left leg 03/29/2016   Past Surgical History:  Procedure Laterality Date   ABDOMINAL HYSTERECTOMY     APPENDECTOMY     COLONOSCOPY     ESOPHAGOGASTRODUODENOSCOPY     EXCISION HAGLUND'S  DEFORMITY WITH ACHILLES TENDON REPAIR Right 06/07/2013   Procedure: RIGHT ACHILLES DEBRIDEMENT; HAGLUND EXCISION;  Surgeon: Toni Arthurs, MD;  Location: [facility];  Service: Orthopedics;  Laterality: Right;   EXCISION OF SKIN TAG  11/18/2011   Procedure: EXCISION OF SKIN TAG;  Surgeon: Romie Levee, MD;  Location: [facility];  Service: General;  Laterality: N/A;  posterior   GASTROCNEMIUS RECESSION Right 06/07/2013   Procedure: GASTROCNEMIUS RECESSION;  Surgeon: Toni Arthurs, MD;  Location: [facility];  Service: Orthopedics;  Laterality: Right;   HEMORRHOID BANDING     HEMORRHOID SURGERY  11/18/2011   Procedure: HEMORRHOIDECTOMY;  Surgeon: Romie Levee, MD;  Location: [facility];  Service: General;;   LEFT OOPHORECTOMY     LUNG LOBECTOMY  12/2011   [facility] , LLLobe removed for carcinoid   PLANTAR FASCIA SURGERY     rectal fissure repair     REPLACEMENT TOTAL  KNEE BILATERAL  2022   THYROID SURGERY     nodule and partial thyroidectomy   TONSILLECTOMY     Family History  Problem Relation Age of Onset   Pulmonary embolism Mother    Kidney cancer Father    Breast cancer Sister 26   Breast cancer Maternal Grandmother        30s   Heart disease Maternal Grandfather    Colon cancer Neg Hx    Colon polyps Neg Hx    Esophageal cancer Neg Hx    Rectal cancer Neg Hx    Stomach cancer Neg Hx    Social History   Socioeconomic History   Marital status: Married    Spouse name: Not on file   Number of children: 1   Years of education: Not on file   Highest education level: Not on file  Occupational History   Occupation: homemaker  Tobacco Use   Smoking status: Never   Smokeless tobacco: Never  Vaping Use   Vaping status: Never Used  Substance and Sexual Activity   Alcohol use: Yes    Alcohol/week: 0.0 standard drinks of alcohol    Comment: wine-occ   Drug use: No   Sexual activity: Yes    Birth  control/protection: Surgical  Other Topics Concern   Not on file  Social History Narrative   She is married, she is a Futures trader, she has 1 son who is a Proofreader, he is an Education officer, environmental in [place]   Very occasional alcohol never smoker no tobacco no drug use   Social Drivers of Corporate investment banker Strain: Low Risk  (04/01/2023)   Overall Financial Resource Strain (CARDIA)    Difficulty of Paying Living Expenses: Not hard at all  Food Insecurity: No Food Insecurity (04/01/2023)   Hunger Vital Sign    Worried About Running Out of Food in the Last Year: Never true    Ran Out of Food in the Last Year: Never true  Transportation Needs: No Transportation Needs (04/01/2023)   PRAPARE - Administrator, Civil Service (Medical): No    Lack of Transportation (Non-Medical): No  Physical Activity: Sufficiently Active (04/01/2023)   Exercise Vital Sign    Days of Exercise per Week: 7 days    Minutes of Exercise per Session: 30 min  Stress: No Stress Concern Present (04/01/2023)   [employer] of Occupational Health - Occupational Stress Questionnaire    Feeling of Stress : Not at all  Social Connections: Moderately Isolated (04/01/2023)   Social Connection and Isolation Panel [NHANES]    Frequency of Communication with Friends and Family: Three times a week    Frequency of Social Gatherings with Friends and Family: Once a week    Attends Religious Services: Never    Database administrator or Organizations: No    Attends Engineer, structural: Never    Marital Status: Married    Tobacco Counseling Counseling given: Yes    Clinical Intake:  Pre-visit preparation completed: Yes  Pain : 0-10 Pain Score: 2  Pain Type: Chronic pain (arthritis) Pain Location:  (joints) Pain Orientation: Other (Comment) (hands, knees) Pain Descriptors / Indicators: Aching Pain Onset: More than a month ago Pain Frequency: Intermittent     BMI  - recorded: 35.58 Nutritional Status: BMI > 30  Obese Nutritional Risks: None Diabetes: No  Lab Results  Component Value Date   HGBA1C 5.4 06/04/2016   HGBA1C 5.9 (H) 07/16/2013  HGBA1C 5.7 (H) 12/18/2012     How often do you need to have someone help you when you read instructions, pamphlets, or other written materials from your doctor or pharmacy?: 1 - Never  Interpreter Needed?: No  Information entered by :: Maryjean Ka CMA   Activities of Daily Living     04/01/2023    2:41 PM  In your present state of health, do you have any difficulty performing the following activities:  Hearing? 0  Vision? 0  Difficulty concentrating or making decisions? 0  Walking or climbing stairs? 0  Dressing or bathing? 0  Doing errands, shopping? 0  Preparing Food and eating ? N  Using the Toilet? N  In the past six months, have you accidently leaked urine? Y  Do you have problems with loss of bowel control? N  Managing your Medications? N  Managing your Finances? N  Housekeeping or managing your Housekeeping? N    Patient Care Team: Babs Sciara, MD as PCP - General (Family Medicine) Jake Bathe, MD as PCP - Cardiology (Cardiology) Maris Berger, MD as Consulting Physician (Ophthalmology)  Indicate any recent Medical Services you may have received from other than [facility] providers in the past year (date may be approximate).     Assessment:   This is a routine wellness examination for [place].  Hearing/Vision screen Hearing Screening - Comments:: Patient states she does have hearing difficulties. Has had hearing tested about 2 years ago. Prefers to go back to that doctor for additional testing.   Vision Screening - Comments:: Wears rx glasses - up to date with routine eye exams  Patient sees Dr. Charlotte Sanes in [place].    Goals Addressed             This Visit's Progress    Patient Stated       Lose weight       Depression Screen     04/01/2023    2:42 PM 06/02/2022     3:06 PM 12/11/2021    1:37 PM 11/20/2020    8:53 AM 05/13/2020    8:31 AM 09/10/2019    9:40 AM  PHQ 2/9 Scores  PHQ - 2 Score 0 0 0 0 0 0  PHQ- 9 Score 2     1    Fall Risk     04/01/2023    2:40 PM 06/02/2022    3:06 PM 12/11/2021    1:35 PM 11/20/2020    8:53 AM 05/13/2020    8:31 AM  Fall Risk   Falls in the past year? 0 0 1 1 0  Number falls in past yr: 0 0 0 0   Injury with Fall? 0 0 0 0   Risk for fall due to : No Fall Risks No Fall Risks No Fall Risks Impaired balance/gait No Fall Risks  Follow up Falls prevention discussed;Falls evaluation completed Falls prevention discussed Falls evaluation completed;Education provided;Falls prevention discussed Falls evaluation completed;Education provided Falls evaluation completed    MEDICARE RISK AT HOME:  Medicare Risk at Home Any stairs in or around the home?: Yes If so, are there any without handrails?: No Home free of loose throw rugs in walkways, pet beds, electrical cords, etc?: No (patient states they have rugs and they are aware of them) Adequate lighting in your home to reduce risk of falls?: Yes Life alert?: No Use of a cane, walker or w/c?: No Grab bars in the bathroom?: Yes Shower chair or bench in shower?: Yes  Elevated toilet seat or a handicapped toilet?: Yes  TIMED UP AND GO:  Was the test performed?  No  Cognitive Function: 6CIT completed        04/01/2023    2:34 PM 12/11/2021    1:38 PM  6CIT Screen  What Year? 0 points 0 points  What month? 0 points 0 points  What time? 0 points 0 points  Count back from 20 0 points 0 points  Months in reverse 0 points 0 points  Repeat phrase 0 points 0 points  Total Score 0 points 0 points    Immunizations Immunization History  Administered Date(s) Administered   H1N1 01/29/2008   Influenza Inj Mdck Quad Pf 10/13/2016, 10/27/2017, 10/18/2018   Influenza, High Dose Seasonal PF 10/11/2021, 10/12/2022   Influenza,inj,Quad PF,6+ Mos 11/05/2014   Influenza,inj,quad,  With Preservative 10/27/2017   Influenza-Unspecified 10/11/2008, 10/15/2013, 11/05/2014, 09/30/2015, 10/11/2017, 10/26/2018, 11/08/2019, 10/22/2020   MODERNA COVID-19 SARS-COV-2 PEDS BIVALENT BOOSTER 2yr-5yr 10/25/2021   Moderna Sars-Covid-2 Vaccination 04/03/2019   PFIZER(Purple Top)SARS-COV-2 Vaccination 02/19/2019, 03/12/2019, 03/13/2019, 11/10/2019   Pfizer(Comirnaty)Fall Seasonal Vaccine 12 years and older 10/12/2022   Pneumococcal Conjugate-13 12/12/2014   Pneumococcal Polysaccharide-23 01/12/2012   Td 12/12/2014   Zoster Recombinant(Shingrix) 07/10/2017, 08/09/2017   Zoster, Live 12/08/2015    Screening Tests Health Maintenance  Topic Date Due   Pneumonia Vaccine 64+ Years old (3 of 3 - PPSV23 or PCV20) 06/02/2023 (Originally 11/27/2020)   DEXA SCAN  06/02/2023 (Originally 11/27/2020)   COVID-19 Vaccine (8 - Pfizer risk 2024-25 season) 04/12/2023   MAMMOGRAM  01/25/2024   Medicare Annual Wellness (AWV)  03/31/2024   DTaP/Tdap/Td (2 - Tdap) 12/11/2024   Colonoscopy  10/23/2030   INFLUENZA VACCINE  Completed   Hepatitis C Screening  Completed   Zoster Vaccines- Shingrix  Completed   HPV VACCINES  Aged Out    Health Maintenance  There are no preventive care reminders to display for this patient. Health Maintenance Items Addressed: DEXA ordered  Additional Screening:  Vision Screening: Recommended annual ophthalmology exams for early detection of glaucoma and other disorders of the eye.  Dental Screening: Recommended annual dental exams for proper oral hygiene  Community Resource Referral / Chronic Care Management: CRR required this visit?  No   CCM required this visit?  No     Plan:     I have personally reviewed and noted the following in the patient's chart:   Medical and social history Use of alcohol, tobacco or illicit drugs  Current medications and supplements including opioid prescriptions. Patient is not currently taking opioid  prescriptions. Functional ability and status Nutritional status Physical activity Advanced directives List of other physicians Hospitalizations, surgeries, and ER visits in previous 12 months Vitals Screenings to include cognitive, depression, and falls Referrals and appointments  In addition, I have reviewed and discussed with patient certain preventive protocols, quality metrics, and best practice recommendations. A written personalized care plan for preventive services as well as general preventive health recommendations were provided to patient.     Jordan Hawks Adison Reifsteck, CMA   04/01/2023   After Visit Summary: (Mail) Due to this being a telephonic visit, the after visit summary with patients personalized plan was offered to patient via mail   Notes: Please refer to Routing Comments.

## 2023-04-01 NOTE — Patient Instructions (Signed)
 Debbie Miranda , Thank you for taking time to come for your Medicare Wellness Visit. I appreciate your ongoing commitment to your health goals. Please review the following plan we discussed and let me know if I can assist you in the future.   Referrals/Orders/Follow-Ups/Clinician Recommendations:  Next Medicare Annual Wellness Visit:   April 06, 2024 at 2:20 pm video visit.   An order was placed for you to have a mammogram and/or bone density scan. Please call the number below to get that scheduled. Make sure to wear 2 piece comfortable clothing. If you are having a mammogram, please remember to NOT use perfume, powders, or lotions the day of your scan. If you are having a bone density scan, please discontinue any medications/supplements containing calcium at least 48 hours (2 days) before your scan.  [facility] [address] [place], [place] [address] [phone] Make sure to wear two-piece clothing.  No lotions powders or deodorants the day of the appointment Make sure to bring picture ID and insurance card.  Bring list of medications you are currently taking including any supplements.   Schedule your [place] screening mammogram through MyChart!   Log into your MyChart account.  Go to 'Visit' (or 'Appointments' if on mobile App) --> Schedule an Appointment  Under 'Select a Reason for Visit' choose the Mammogram Screening option.  Complete the pre-visit questions and select the time and place that best fits your schedule.  This is a list of the screening recommended for you and due dates:  Health Maintenance  Topic Date Due   Flu Shot  08/12/2022   COVID-19 Vaccine (7 - 2024-25 season) 09/12/2022   Medicare Annual Wellness Visit  12/12/2022   Pneumonia Vaccine (3 of 3 - PPSV23 or PCV20) 06/02/2023*   DEXA scan (bone density measurement)  06/02/2023*   Mammogram  01/25/2024   DTaP/Tdap/Td vaccine (2 - Tdap) 12/11/2024   Colon Cancer Screening  10/23/2030    Hepatitis C Screening  Completed   Zoster (Shingles) Vaccine  Completed   HPV Vaccine  Aged Out  *Topic was postponed. The date shown is not the original due date.    Advanced directives: (Copy Requested) Please bring a copy of your health care power of attorney and living will to the office to be added to your chart at your convenience. You can mail to [facility] [address] [place], [place] [address] or email to [email or web address]  Next Medicare Annual Wellness Visit scheduled for next year: yes  Understanding Your Risk for Falls Millions of people have serious injuries from falls each year. It is important to understand your risk of falling. Talk with your health care provider about your risk and what you can do to lower it. If you do have a serious fall, make sure to tell your provider. Falling once raises your risk of falling again. How can falls affect me? Serious injuries from falls are common. These include: Broken bones, such as hip fractures. Head injuries, such as traumatic brain injuries (TBI) or concussions. A fear of falling can cause you to avoid activities and stay at home. This can make your muscles weaker and raise your risk for a fall. What can increase my risk? There are a number of risk factors that increase your risk for falling. The more risk factors you have, the higher your risk of falling. Serious injuries from a fall happen most often to people who are older than 68 years old.  Teenagers and young adults ages 57-29 are also at higher risk. Common risk factors include: Weakness in the lower body. Being generally weak or confused due to long-term (chronic) illness. Dizziness or balance problems. Poor vision. Medicines that cause dizziness or drowsiness. These may include: Medicines for your blood pressure, heart, anxiety, insomnia, or swelling (edema). Pain medicines. Muscle relaxants. Other risk factors include: Drinking  alcohol. Having had a fall in the past. Having foot pain or wearing improper footwear. Working at a dangerous job. Having any of the following in your home: Tripping hazards, such as floor clutter or loose rugs. Poor lighting. Pets. Having dementia or memory loss. What actions can I take to lower my risk of falling?     Physical activity Stay physically fit. Do strength and balance exercises. Consider taking a regular class to build strength and balance. Yoga and tai chi are good options. Vision Have your eyes checked every year and your prescription for glasses or contacts updated as needed. Shoes and walking aids Wear non-skid shoes. Wear shoes that have rubber soles and low heels. Do not wear high heels. Do not walk around the house in socks or slippers. Use a cane or walker as told by your provider. Home safety Attach secure railings on both sides of your stairs. Install grab bars for your bathtub, shower, and toilet. Use a non-skid mat in your bathtub or shower. Attach bath mats securely with double-sided, non-slip rug tape. Use good lighting in all rooms. Keep a flashlight near your bed. Make sure there is a clear path from your bed to the bathroom. Use night-lights. Do not use throw rugs. Make sure all carpeting is taped or tacked down securely. Remove all clutter from walkways and stairways, including extension cords. Repair uneven or broken steps and floors. Avoid walking on icy or slippery surfaces. Walk on the grass instead of on icy or slick sidewalks. Use ice melter to get rid of ice on walkways in the winter. Use a cordless phone. Questions to ask your health care provider Can you help me check my risk for a fall? Do any of my medicines make me more likely to fall? Should I take a vitamin D supplement? What exercises can I do to improve my strength and balance? Should I make an appointment to have my vision checked? Do I need a bone density test to check for weak  bones (osteoporosis)? Would it help to use a cane or a walker? Where to find more information Centers for Disease Control and Prevention, STEADI: TonerPromos.no Community-Based Fall Prevention Programs: TonerPromos.no [employer] on Aging: BaseRingTones.pl Contact a health care provider if: You fall at home. You are afraid of falling at home. You feel weak, drowsy, or dizzy. This information is not intended to replace advice given to you by your health care provider. Make sure you discuss any questions you have with your health care provider. Document Revised: 08/31/2021 Document Reviewed: 08/31/2021 Elsevier Patient Education  2024 [employer].

## 2023-04-03 ENCOUNTER — Other Ambulatory Visit: Payer: Self-pay | Admitting: Family Medicine

## 2023-04-03 ENCOUNTER — Telehealth: Payer: Self-pay | Admitting: Family Medicine

## 2023-04-03 NOTE — Telephone Encounter (Signed)
 Patient has standard office visit coming up This is in April Please order lipid, liver, metabolic 7, TSH, free T4, CBC  Diagnosis leukocytosis, hyperlipidemia, high risk medication, hypothyroidism  Please send patient a MyChart message or call her letting her know the labs have been ordered and request that she go ahead with getting these drawn before her visit  Thanks-Dr. Lorin Picket

## 2023-04-04 ENCOUNTER — Other Ambulatory Visit: Payer: Self-pay

## 2023-04-04 DIAGNOSIS — E785 Hyperlipidemia, unspecified: Secondary | ICD-10-CM

## 2023-04-04 DIAGNOSIS — D72829 Elevated white blood cell count, unspecified: Secondary | ICD-10-CM

## 2023-04-04 DIAGNOSIS — Z79899 Other long term (current) drug therapy: Secondary | ICD-10-CM

## 2023-04-04 DIAGNOSIS — E039 Hypothyroidism, unspecified: Secondary | ICD-10-CM

## 2023-04-14 ENCOUNTER — Encounter: Payer: Self-pay | Admitting: Family Medicine

## 2023-04-14 LAB — BASIC METABOLIC PANEL WITH GFR
BUN/Creatinine Ratio: 18 (ref 12–28)
BUN: 13 mg/dL (ref 8–27)
CO2: 25 mmol/L (ref 20–29)
Calcium: 9.2 mg/dL (ref 8.7–10.3)
Chloride: 101 mmol/L (ref 96–106)
Creatinine, Ser: 0.71 mg/dL (ref 0.57–1.00)
Glucose: 87 mg/dL (ref 70–99)
Potassium: 4.3 mmol/L (ref 3.5–5.2)
Sodium: 140 mmol/L (ref 134–144)
eGFR: 93 mL/min/{1.73_m2} (ref >59)

## 2023-04-14 LAB — CBC WITH DIFFERENTIAL/PLATELET
Basophils Absolute: 0 10*3/uL (ref 0.0–0.2)
Basos: 0 %
EOS (ABSOLUTE): 0.1 10*3/uL (ref 0.0–0.4)
Eos: 2 %
Hematocrit: 45 % (ref 34.0–46.6)
Hemoglobin: 15.1 g/dL (ref 11.1–15.9)
Immature Grans (Abs): 0 10*3/uL (ref 0.0–0.1)
Immature Granulocytes: 0 %
Lymphocytes Absolute: 1.8 10*3/uL (ref 0.7–3.1)
Lymphs: 36 %
MCH: 31.7 pg (ref 26.6–33.0)
MCHC: 33.6 g/dL (ref 31.5–35.7)
MCV: 95 fL (ref 79–97)
Monocytes Absolute: 0.4 10*3/uL (ref 0.1–0.9)
Monocytes: 8 %
Neutrophils Absolute: 2.7 10*3/uL (ref 1.4–7.0)
Neutrophils: 54 %
Platelets: 172 10*3/uL (ref 150–450)
RBC: 4.76 x10E6/uL (ref 3.77–5.28)
RDW: 12.5 % (ref 11.7–15.4)
WBC: 5 10*3/uL (ref 3.4–10.8)

## 2023-04-14 LAB — HEPATIC FUNCTION PANEL
ALT: 28 IU/L (ref 0–32)
AST: 28 IU/L (ref 0–40)
Albumin: 4.4 g/dL (ref 3.9–4.9)
Alkaline Phosphatase: 75 IU/L (ref 44–121)
Bilirubin Total: 0.5 mg/dL (ref 0.0–1.2)
Bilirubin, Direct: 0.22 mg/dL (ref 0.00–0.40)
Total Protein: 6.1 g/dL (ref 6.0–8.5)

## 2023-04-14 LAB — TSH+FREE T4
Free T4: 1.94 ng/dL — ABNORMAL HIGH (ref 0.82–1.77)
TSH: 0.177 u[IU]/mL — ABNORMAL LOW (ref 0.450–4.500)

## 2023-04-14 LAB — LIPID PANEL
Chol/HDL Ratio: 2.1 ratio (ref 0.0–4.4)
Cholesterol, Total: 107 mg/dL (ref 100–199)
HDL: 51 mg/dL (ref >39)
LDL Chol Calc (NIH): 41 mg/dL (ref 0–99)
Triglycerides: 72 mg/dL (ref 0–149)
VLDL Cholesterol Cal: 15 mg/dL (ref 5–40)

## 2023-04-19 ENCOUNTER — Encounter: Admitting: Family Medicine

## 2023-04-25 ENCOUNTER — Other Ambulatory Visit: Payer: Self-pay | Admitting: Family Medicine

## 2023-04-27 ENCOUNTER — Other Ambulatory Visit: Payer: Self-pay | Admitting: Family Medicine

## 2023-05-04 ENCOUNTER — Encounter: Payer: Self-pay | Admitting: Obstetrics & Gynecology

## 2023-05-04 ENCOUNTER — Ambulatory Visit: Admitting: Obstetrics & Gynecology

## 2023-05-04 VITALS — BP 115/81 | HR 69 | Ht 68.0 in | Wt 227.0 lb

## 2023-05-04 DIAGNOSIS — Z01419 Encounter for gynecological examination (general) (routine) without abnormal findings: Secondary | ICD-10-CM

## 2023-05-04 DIAGNOSIS — Z0000 Encounter for general adult medical examination without abnormal findings: Secondary | ICD-10-CM | POA: Diagnosis not present

## 2023-05-04 DIAGNOSIS — Z803 Family history of malignant neoplasm of breast: Secondary | ICD-10-CM | POA: Diagnosis not present

## 2023-05-04 NOTE — Progress Notes (Signed)
 WELL-WOMAN EXAMINATION Patient name: Debbie Miranda MRN [id number]  Date of birth: 1955-03-27 Chief Complaint:   No chief complaint on file.  History of Present Illness:   Debbie Miranda is a 68 y.o. G36P1001 PH female being seen today for a routine well-woman exam.   Notes it had been many years since she had previously seen a gynecologist and just wanted to check in to make sure there was not anything else that need to be done.    She was concerned about her risk of breast cancer and wanted clarity regarding screening.  Sister with breast CA diagnosed at 22 and maternal grandmother who was under the age of 65. Gregary Lean Model completed- risk of Breast Ca 3%, lifetime risk 10%  -s/p hysterectomy due to fibroids  2x to void at night.  Some mild stress incontinence.  Not an issue  No LMP recorded. Patient has had a hysterectomy. Denies issues with her menses The current method of family planning is status post hysterectomy.    Last pap not indicated.  Last mammogram: 01/2023. Last colonoscopy: 2022     05/04/2023    9:29 AM 04/01/2023    2:42 PM 06/02/2022    3:06 PM 12/11/2021    1:37 PM 11/20/2020    8:53 AM  Depression screen PHQ 2/9  Decreased Interest 0 0 0 0 0  Down, Depressed, Hopeless 0 0 0 0 0  PHQ - 2 Score 0 0 0 0 0  Altered sleeping 1 1     Tired, decreased energy 0 1     Change in appetite 0 0     Feeling bad or failure about yourself  0 0     Trouble concentrating 0 0     Moving slowly or fidgety/restless 0 0     Suicidal thoughts 0 0     PHQ-9 Score 1 2     Difficult doing work/chores  Not difficult at all         Review of Systems:   Pertinent items are noted in HPI Denies any headaches, blurred vision, fatigue, shortness of breath, chest pain, abdominal pain, bowel movements, urination, or intercourse unless otherwise stated above.  Pertinent History Reviewed:  Reviewed past medical,surgical, social and family history.  Reviewed problem list,  medications and allergies. Physical Assessment:   Vitals:   05/04/23 0924  BP: 115/81  Pulse: 69  Weight: 227 lb (103 kg)  Height: 5\' 8"  (1.727 m)  Body mass index is 34.52 kg/m.        Physical Examination:   General appearance - well appearing, and in no distress  Mental status - alert, oriented to person, place, and time  Psych:  She has a normal mood and affect  Skin - warm and dry, normal color, no suspicious lesions noted  Chest - effort normal, all lung fields clear to auscultation bilaterally  Heart - normal rate and regular rhythm  Neck:  midline trachea, no thyromegaly or nodules  Breasts - breasts appear normal, no suspicious masses, no skin or nipple changes or  axillary nodes  Abdomen - soft, nontender, nondistended, no masses or organomegaly  Pelvic - VULVA: normal appearing vulva with no masses, tenderness or lesions  VAGINA: normal appearing vagina with normal color and discharge, no lesions  Uterus and cervix surgically absent  Extremities:  No swelling or varicosities noted  Chaperone:  pt declined      Assessment & Plan:  1) Well-Woman Exam -pap not indicated,  no cervix noted on exam.  Hysterectomy completed for benign reasons.  Reviewed ASCCP guidelines and again discussed that no further screening indicated - Patient is up-to-date on mammogram and colonoscopy - She is in the process of DEXA screening  Follow up prn No orders of the defined types were placed in this encounter.   Meds: No orders of the defined types were placed in this encounter.   Follow-up: Return if symptoms worsen or fail to improve.   Senai Ramnath, DO Attending Obstetrician & Gynecologist, [facility] for [employer], [facility]

## 2023-05-10 ENCOUNTER — Encounter: Admitting: Nurse Practitioner

## 2023-05-12 ENCOUNTER — Encounter: Payer: Self-pay | Admitting: Family Medicine

## 2023-05-12 ENCOUNTER — Ambulatory Visit (INDEPENDENT_AMBULATORY_CARE_PROVIDER_SITE_OTHER): Admitting: Family Medicine

## 2023-05-12 VITALS — BP 105/74 | HR 65 | Temp 98.1°F | Ht 68.0 in | Wt 224.6 lb

## 2023-05-12 DIAGNOSIS — E039 Hypothyroidism, unspecified: Secondary | ICD-10-CM

## 2023-05-12 DIAGNOSIS — E785 Hyperlipidemia, unspecified: Secondary | ICD-10-CM

## 2023-05-12 DIAGNOSIS — E66811 Obesity, class 1: Secondary | ICD-10-CM

## 2023-05-12 DIAGNOSIS — Z0000 Encounter for general adult medical examination without abnormal findings: Secondary | ICD-10-CM

## 2023-05-12 MED ORDER — LEVOTHYROXINE SODIUM 125 MCG PO TABS: ORAL_TABLET | ORAL | 1 refills | Status: DC

## 2023-05-12 NOTE — Progress Notes (Addendum)
 Subjective:    Patient ID: Debbie Miranda, female    DOB: [birth year], 68 y.o.   MRN: [id number]  HPI follow up for lab results Patient doing a good job with eating and activity She has lost about 30 pounds She is on semaglutide Unfortunately because of the FDA regulations that lower cost semaglutide will not be available More than likely patient will be coming off the medication at that time She states she has been doing well with healthy eating She is taking her medicines as directed Results for orders placed or performed in visit on 04/04/23  CBC with Differential   Collection Time: 04/13/23  8:17 AM  Result Value Ref Range   WBC 5.0 3.4 - 10.8 x10E3/uL   RBC 4.76 3.77 - 5.28 x10E6/uL   Hemoglobin 15.1 11.1 - 15.9 g/dL   Hematocrit 40.9 81.1 - 46.6 %   MCV 95 79 - 97 fL   MCH 31.7 26.6 - 33.0 pg   MCHC 33.6 31.5 - 35.7 g/dL   RDW 91.4 78.2 - 95.6 %   Platelets 172 150 - 450 x10E3/uL   Neutrophils 54 Not Estab. %   Lymphs 36 Not Estab. %   Monocytes 8 Not Estab. %   Eos 2 Not Estab. %   Basos 0 Not Estab. %   Neutrophils Absolute 2.7 1.4 - 7.0 x10E3/uL   Lymphocytes Absolute 1.8 0.7 - 3.1 x10E3/uL   Monocytes Absolute 0.4 0.1 - 0.9 x10E3/uL   EOS (ABSOLUTE) 0.1 0.0 - 0.4 x10E3/uL   Basophils Absolute 0.0 0.0 - 0.2 x10E3/uL   Immature Granulocytes 0 Not Estab. %   Immature Grans (Abs) 0.0 0.0 - 0.1 x10E3/uL  Lipid Panel   Collection Time: 04/13/23  8:17 AM  Result Value Ref Range   Cholesterol, Total 107 100 - 199 mg/dL   Triglycerides 72 0 - 149 mg/dL   HDL 51 >21 mg/dL   VLDL Cholesterol Cal 15 5 - 40 mg/dL   LDL Chol Calc (NIH) 41 0 - 99 mg/dL   Chol/HDL Ratio 2.1 0.0 - 4.4 ratio  Hepatic Function Panel   Collection Time: 04/13/23  8:17 AM  Result Value Ref Range   Total Protein 6.1 6.0 - 8.5 g/dL   Albumin 4.4 3.9 - 4.9 g/dL   Bilirubin Total 0.5 0.0 - 1.2 mg/dL   Bilirubin, Direct 3.08 0.00 - 0.40 mg/dL   Alkaline Phosphatase 75 44 - 121 IU/L   AST 28 0 -  40 IU/L   ALT 28 0 - 32 IU/L  Basic Metabolic Panel   Collection Time: 04/13/23  8:17 AM  Result Value Ref Range   Glucose 87 70 - 99 mg/dL   BUN 13 8 - 27 mg/dL   Creatinine, Ser 6.57 0.57 - 1.00 mg/dL   eGFR 93 >84 ON/GEX/5.28   BUN/Creatinine Ratio 18 12 - 28   Sodium 140 134 - 144 mmol/L   Potassium 4.3 3.5 - 5.2 mmol/L   Chloride 101 96 - 106 mmol/L   CO2 25 20 - 29 mmol/L   Calcium  9.2 8.7 - 10.3 mg/dL  TSH + free T4   Collection Time: 04/13/23  8:17 AM  Result Value Ref Range   TSH 0.177 (L) 0.450 - 4.500 uIU/mL   Free T4 1.94 (H) 0.82 - 1.77 ng/dL    We reviewed the lab in detail.  Thyroid  function is not in the zone that we would like for it to be Need to make adjustments LDL is  well below 55.  May stop Zetia  currently Recheck lab work in later this year Vaccine questions-pneumonia      Review of Systems     Objective:   Physical Exam General-in no acute distress Eyes-no discharge Lungs-respiratory rate normal, CTA CV-no murmurs,RRR Extremities skin warm dry no edema Neuro grossly normal Behavior normal, alert        Assessment & Plan:   1. Hypothyroidism, unspecified type (Primary) Thyroid  function actually shows too much levothyroxine .  She has half the thyroid .  The rest is by supplementation.  We will reduce levothyroxine  to half a tablet on Sundays and 1 tablet on all other days. - TSH + free T4 Re look at labs in July comprehensive labs in the fall follow-up 6 months 2. Hyperlipidemia, unspecified hyperlipidemia type May stop Zetia  Continue statin Healthy diet  3. Obesity (BMI 30.0-34.9) Patient has done great losing weight hopefully she can keep the weight off More than likely will not be able to stay on semaglutide Patient will let us  know if any other issues or problems otherwise follow-up in approximately 6 months   I did discuss with her doing pneumococcal 20 vaccine she will get this through her pharmacy She will also let us  know  when she had her RSV vaccine

## 2023-05-23 ENCOUNTER — Ambulatory Visit: Admitting: Family Medicine

## 2023-05-23 ENCOUNTER — Ambulatory Visit: Payer: Self-pay

## 2023-05-23 NOTE — Telephone Encounter (Signed)
 Copied from CRM [id number]. Topic: Clinical - Medical Advice >> May 23, 2023  8:32 AM Hassie Lint wrote: Reason for CRM: Patient was in the office last week to see the doctor and now has a sinus infection. Symptoms include discomfort in her ears off and on and then a headache she can feel around her eyes and cheeks. Patient seeking medication. States she can come in at the end of the week if needed. Patient can be reached at [phone]  Chief Complaint: sinus pain and pressure Symptoms: see below Frequency: constant Pertinent Negatives: Patient denies fever, cough, sore throat Disposition: [] ED /[] Urgent Care (no appt availability in office) / [x] Appointment(In office/virtual)/ []  [facility]/ [] Home Care/ [] Refused Recommended Disposition /[] [facility]/ []  Follow-up with PCP Additional Notes: apt made for today; care advice given, denies questions; instructed to go to ER if becomes worse.   Reason for Disposition  [1] Sinus congestion (pressure, fullness) AND [2] present > 10 days  Answer Assessment - Initial Assessment Questions 1. LOCATION: "Where does it hurt?"      Behind eyes, right eye worse, cheeks 2. ONSET: "When did the sinus pain start?"  (e.g., hours, days)      Several days ago 3. SEVERITY: "How bad is the pain?"   (Scale 1-10; mild, moderate or severe)   - MILD (1-3): doesn't interfere with normal activities    - MODERATE (4-7): interferes with normal activities (e.g., work or school) or awakens from sleep   - SEVERE (8-10): excruciating pain and patient unable to do any normal activities        moderate 4. RECURRENT SYMPTOM: "Have you ever had sinus problems before?" If Yes, ask: "When was the last time?" and "What happened that time?"      ye 5. NASAL CONGESTION: "Is the nose blocked?" If Yes, ask: "Can you open it or must you breathe through your mouth?"     Runny nose 6. NASAL DISCHARGE: "Do you have discharge from your nose?" If so ask, "What  color?"     white 7. FEVER: "Do you have a fever?" If Yes, ask: "What is it, how was it measured, and when did it start?"      no 8. OTHER SYMPTOMS: "Do you have any other symptoms?" (e.g., sore throat, cough, earache, difficulty breathing)     earache 9. PREGNANCY: "Is there any chance you are pregnant?" "When was your last menstrual period?"     na  Protocols used: Sinus Pain or Congestion-A-AH

## 2023-05-23 NOTE — Telephone Encounter (Signed)
 Noted.

## 2023-07-11 ENCOUNTER — Other Ambulatory Visit: Payer: Self-pay | Admitting: Family Medicine

## 2023-07-21 ENCOUNTER — Ambulatory Visit (HOSPITAL_BASED_OUTPATIENT_CLINIC_OR_DEPARTMENT_OTHER)
Admission: RE | Admit: 2023-07-21 | Discharge: 2023-07-21 | Disposition: A | Discharge status: Home / Self Care | Source: Ambulatory Visit | Attending: Family Medicine | Admitting: Family Medicine

## 2023-07-21 ENCOUNTER — Ambulatory Visit: Payer: Self-pay | Admitting: Family Medicine

## 2023-07-21 DIAGNOSIS — Z780 Asymptomatic menopausal state: Secondary | ICD-10-CM | POA: Diagnosis present

## 2023-07-25 ENCOUNTER — Other Ambulatory Visit: Payer: Self-pay | Admitting: Internal Medicine

## 2023-07-27 LAB — TSH+FREE T4
Free T4: 1.69 ng/dL (ref 0.82–1.77)
TSH: 0.158 u[IU]/mL — ABNORMAL LOW (ref 0.450–4.500)

## 2023-07-30 ENCOUNTER — Other Ambulatory Visit: Payer: Self-pay | Admitting: Family Medicine

## 2023-07-30 ENCOUNTER — Ambulatory Visit: Payer: Self-pay | Admitting: Family Medicine

## 2023-07-30 DIAGNOSIS — Z0000 Encounter for general adult medical examination without abnormal findings: Secondary | ICD-10-CM

## 2023-07-30 MED ORDER — LEVOTHYROXINE SODIUM 125 MCG PO TABS: ORAL_TABLET | ORAL | 1 refills | Status: DC

## 2023-10-19 ENCOUNTER — Other Ambulatory Visit: Payer: Self-pay | Admitting: *Deleted

## 2023-10-19 DIAGNOSIS — E785 Hyperlipidemia, unspecified: Secondary | ICD-10-CM

## 2023-10-19 DIAGNOSIS — E039 Hypothyroidism, unspecified: Secondary | ICD-10-CM

## 2023-10-19 DIAGNOSIS — Z79899 Other long term (current) drug therapy: Secondary | ICD-10-CM

## 2023-11-01 LAB — HEPATIC FUNCTION PANEL
ALT: 22 IU/L (ref 0–32)
AST: 25 IU/L (ref 0–40)
Albumin: 4.5 g/dL (ref 3.9–4.9)
Alkaline Phosphatase: 76 IU/L (ref 49–135)
Bilirubin Total: 0.3 mg/dL (ref 0.0–1.2)
Bilirubin, Direct: 0.14 mg/dL (ref 0.00–0.40)
Total Protein: 6.7 g/dL (ref 6.0–8.5)

## 2023-11-01 LAB — T4, FREE: Free T4: 1.5 ng/dL (ref 0.82–1.77)

## 2023-11-01 LAB — LIPID PANEL
Chol/HDL Ratio: 2.6 ratio (ref 0.0–4.4)
Cholesterol, Total: 171 mg/dL (ref 100–199)
HDL: 67 mg/dL (ref >39)
LDL Chol Calc (NIH): 91 mg/dL (ref 0–99)
Triglycerides: 65 mg/dL (ref 0–149)
VLDL Cholesterol Cal: 13 mg/dL (ref 5–40)

## 2023-11-01 LAB — TSH: TSH: 1.96 u[IU]/mL (ref 0.450–4.500)

## 2023-11-02 ENCOUNTER — Ambulatory Visit: Payer: Self-pay | Admitting: Family Medicine

## 2023-11-14 ENCOUNTER — Ambulatory Visit: Admitting: Family Medicine

## 2023-11-14 VITALS — BP 109/74 | HR 72 | Temp 97.7°F | Ht 68.0 in | Wt 188.0 lb

## 2023-11-14 DIAGNOSIS — E039 Hypothyroidism, unspecified: Secondary | ICD-10-CM

## 2023-11-14 DIAGNOSIS — Z0000 Encounter for general adult medical examination without abnormal findings: Secondary | ICD-10-CM

## 2023-11-14 DIAGNOSIS — Z79899 Other long term (current) drug therapy: Secondary | ICD-10-CM | POA: Diagnosis not present

## 2023-11-14 DIAGNOSIS — E663 Overweight: Secondary | ICD-10-CM | POA: Diagnosis not present

## 2023-11-14 DIAGNOSIS — E785 Hyperlipidemia, unspecified: Secondary | ICD-10-CM | POA: Diagnosis not present

## 2023-11-14 MED ORDER — EZETIMIBE 10 MG PO TABS: 10.0000 mg | ORAL_TABLET | Freq: Every day | ORAL | 1 refills | Status: AC

## 2023-11-14 MED ORDER — LEVOTHYROXINE SODIUM 125 MCG PO TABS: ORAL_TABLET | ORAL | 1 refills | Status: AC

## 2023-11-14 MED ORDER — ROSUVASTATIN CALCIUM 40 MG PO TABS: 40.0000 mg | ORAL_TABLET | Freq: Every day | ORAL | 2 refills | Status: AC

## 2023-11-14 NOTE — Progress Notes (Signed)
 Subjective:    Patient ID: Debbie Miranda, female    DOB: Apr 09, 1955, 69 y.o.   MRN: [id number]  HPI  6 month follow up  Lab results follow up Discussed the use of AI scribe software for clinical note transcription with the patient, who gave verbal consent to proceed. Discussed the use of AI scribe software for clinical note transcription with the patient, who gave verbal consent to proceed.  History of Present Illness   Debbie Miranda is a 68 year old female with hypothyroidism and hyperlipidemia who presents for follow-up on thyroid  medication and cholesterol management.  She is following up on her thyroid  medication management. She has been taking her thyroid  medication by splitting the dose, taking half on Sunday and half on Wednesday, with a full dose on the other days. She has had previous issues with her TSH levels and her medication dose was adjusted accordingly. She is consistent with her thyroid  medication and has had no recent symptoms that would necessitate further lung scans, as her last scan in 2022 showed stability.  She is managing her cholesterol levels, with her LDL cholesterol increasing from 41 to 91, with previous values of 94, 88, and 81. She is on a maximum dose of rosuvastatin  and was previously on ezetimibe , which was discontinued but is now being considered for reintroduction to help lower her LDL levels further.  She is actively working on [employer] and is on weight loss medication- semaglutide- with a dose of 70. She is content with this dose as it helps maintain her appetite. She has experienced significant weight loss from a previous maximum of 270 pounds to her current weight, which she maintains through healthy eating habits and regular walking, primarily on a treadmill.  She reports issues with sleep, stating she can fall asleep but often wakes up and has difficulty returning to sleep. She does not take naps during the day. Her energy levels are  reasonable despite these sleep disturbances.  Her bowel movements are usually regular, with no blood present. She reports no recent falls or injuries. Her bone density was last checked in July.     Results for orders placed or performed in visit on 10/19/23  TSH   Collection Time: 10/31/23  9:11 AM  Result Value Ref Range   TSH 1.960 0.450 - 4.500 uIU/mL  T4, free   Collection Time: 10/31/23  9:11 AM  Result Value Ref Range   Free T4 1.50 0.82 - 1.77 ng/dL  Lipid panel   Collection Time: 10/31/23  9:11 AM  Result Value Ref Range   Cholesterol, Total 171 100 - 199 mg/dL   Triglycerides 65 0 - 149 mg/dL   HDL 67 >60 mg/dL   VLDL Cholesterol Cal 13 5 - 40 mg/dL   LDL Chol Calc (NIH) 91 0 - 99 mg/dL   Chol/HDL Ratio 2.6 0.0 - 4.4 ratio  Hepatic function panel   Collection Time: 10/31/23  9:11 AM  Result Value Ref Range   Total Protein 6.7 6.0 - 8.5 g/dL   Albumin 4.5 3.9 - 4.9 g/dL   Bilirubin Total 0.3 0.0 - 1.2 mg/dL   Bilirubin, Direct 9.85 0.00 - 0.40 mg/dL   Alkaline Phosphatase 76 49 - 135 IU/L   AST 25 0 - 40 IU/L   ALT 22 0 - 32 IU/L     Review of Systems     Objective:   Physical Exam General-in no acute distress Eyes-no discharge Lungs-respiratory rate normal, CTA  CV-no murmurs,RRR Extremities skin warm dry no edema Neuro grossly normal Behavior normal, alert        Assessment & Plan:    2. Hypothyroidism, unspecified type (Primary) Continue medication as prescribed - levothyroxine  (SYNTHROID ) 125 MCG tablet; 1/2 tablet on Sundays and one half on Wednesdays otherwise 1 tablet on all other days  Dispense: 90 tablet; Refill: 1  3. Hyperlipidemia, unspecified hyperlipidemia type Bad back Zetia  the goal is to get LDL below 70 if possible - ezetimibe  (ZETIA ) 10 MG tablet; Take 1 tablet (10 mg total) by mouth daily.  Dispense: 90 tablet; Refill: 1 - rosuvastatin  (CRESTOR ) 40 MG tablet; Take 1 tablet (40 mg total) by mouth daily.  Dispense: 90 tablet;  Refill: 2  4. High risk medication use Liver enzymes look good  5. 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

## 2023-11-29 ENCOUNTER — Encounter: Payer: Self-pay | Admitting: Cardiology

## 2023-11-29 ENCOUNTER — Ambulatory Visit: Attending: Cardiology | Admitting: Cardiology

## 2023-11-29 VITALS — BP 136/77 | HR 69 | Ht 68.0 in | Wt 185.0 lb

## 2023-11-29 DIAGNOSIS — E785 Hyperlipidemia, unspecified: Secondary | ICD-10-CM | POA: Insufficient documentation

## 2023-11-29 DIAGNOSIS — R931 Abnormal findings on diagnostic imaging of heart and coronary circulation: Secondary | ICD-10-CM | POA: Insufficient documentation

## 2023-11-29 NOTE — Patient Instructions (Signed)

## 2023-11-29 NOTE — Progress Notes (Signed)
 Cardiology Office Note:  .   Date:  11/29/2023  ID:  [name], DOB 28-Jul-1955, MRN [id number] PCP: Alphonsa Glendia LABOR, MD  [facility] Providers Cardiologist:  Oneil Parchment, MD     History of Present Illness: .   Debbie Miranda is a 68 y.o. female Discussed the use of AI scribe software  History of Present Illness Debbie Miranda is a 68 year old female with coronary artery disease who presents for a follow-up visit.  She has a history of coronary artery disease, with a coronary CT scan on April 23, 2019, revealing a calcium  score of 102, placing her in the 84th percentile. The scan showed calcified plaque in the proximal LAD and mild aortic arch atherosclerosis. Her LDL was 91 mg/dL in October 2025, and she is currently on rosuvastatin  40 mg daily and Zetia  10 mg daily.  She has a history of asthma, previously managed with Breo, but has no current symptoms and is no longer using inhalers. Her asthma is described as exercise-induced.  She has experienced significant weight loss, going from 264 pounds to 185 pounds, and is currently on semaglutide, a GLP-1 receptor agonist, for weight management.  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 She reports no current respiratory symptoms.  No chest pain or shortness of breath. No family history of heart problems, although her grandparents did have heart issues. She has never smoked.      Studies Reviewed: SABRA   EKG Interpretation Date/Time:  Tuesday November 29 2023 08:40:36 EST Ventricular Rate:  69 PR Interval:  156 QRS Duration:  88 QT Interval:  392 QTC Calculation: 420 R Axis:   248  Text Interpretation: Normal sinus rhythm Possible Right ventricular hypertrophy Inferior infarct , age undetermined Anterolateral infarct (cited on or before 23-Jul-2002) When compared with ECG of 23-Jul-2002 06:48, Questionable change in QRS axis Inferior infarct is now Present  Questionable change in initial forces of Anterolateral leads Nonspecific T wave abnormality no longer evident in Inferior leads Confirmed by Parchment Oneil ([id number]) on 11/29/2023 9:05:17 AM    Results LABS LDL: 91 (10/2023)  RADIOLOGY Coronary CT scan: Calcium  score of 102, 84th percentile, proximal LAD calcified plaque, mild severity, aortic arch atherosclerosis (04/23/2019) Risk Assessment/Calculations:            Physical Exam:   VS:  BP 136/77   Pulse 69   Ht 5' 8 (1.727 m)   Wt 185 lb (83.9 kg)   SpO2 97%   BMI 28.13 kg/m    Wt Readings from Last 3 Encounters:  11/29/23 185 lb (83.9 kg)  11/14/23 188 lb (85.3 kg)  05/12/23 224 lb 9.6 oz (101.9 kg)    GEN: Well nourished, well developed in no acute distress NECK: No JVD; No carotid bruits CARDIAC: RRR, no murmurs, no rubs, no gallops RESPIRATORY:  Clear to auscultation without rales, wheezing or rhonchi  ABDOMEN: Soft, non-tender, non-distended EXTREMITIES:  No edema; No deformity   ASSESSMENT AND PLAN: .    Assessment and Plan Assessment & Plan Atherosclerotic coronary artery disease with hyperlipidemia Atherosclerotic coronary artery disease with calcified plaque in the proximal LAD and aortic arch atherosclerosis. LDL was 91 mg/dL in October 2025, above the target of less than 70 mg/dL. Currently on rosuvastatin  40 mg and Zetia  10 mg daily. No current symptoms of chest pain or dyspnea. Family history of heart problems in grandparents. No smoking history. Discussed potential use  of Repatha if LDL goals are not met with current regimen. Repatha is a biweekly injection that can significantly lower LDL levels, potentially allowing discontinuation of Zetia . - Continue rosuvastatin  40 mg daily. - Continue Zetia  10 mg daily. - Monitor LDL levels; will consider Repatha if LDL remains above target. - Scheduled follow-up in one year.  Obesity, resolved with significant weight loss Significant weight loss from 264 pounds to 185  pounds, attributed to GLP-1 agonist therapy (semaglutide). Weight loss is beneficial for cardiovascular health.  Exercise-induced asthma, currently asymptomatic Exercise-induced asthma, currently asymptomatic. Previously on Breo inhaler, but currently not using any inhalers.         Dispo: 1 yr  Signed, Oneil Parchment, MD

## 2023-12-02 ENCOUNTER — Other Ambulatory Visit

## 2023-12-26 ENCOUNTER — Other Ambulatory Visit: Payer: Self-pay | Admitting: Family Medicine

## 2023-12-26 DIAGNOSIS — Z1231 Encounter for screening mammogram for malignant neoplasm of breast: Secondary | ICD-10-CM

## 2024-01-26 ENCOUNTER — Ambulatory Visit
Admission: RE | Admit: 2024-01-26 | Discharge: 2024-01-26 | Disposition: A | Source: Ambulatory Visit | Attending: Family Medicine | Admitting: Family Medicine

## 2024-01-26 DIAGNOSIS — Z1231 Encounter for screening mammogram for malignant neoplasm of breast: Secondary | ICD-10-CM

## 2024-04-06 ENCOUNTER — Ambulatory Visit

## 2024-05-15 ENCOUNTER — Ambulatory Visit: Admitting: Family Medicine
